# Patient Record
Sex: Male | Born: 1950
Health system: Southern US, Community
[De-identification: ages and names within clinical notes are randomized; demographics above are authoritative.]

## PROBLEM LIST (undated history)

## (undated) DIAGNOSIS — Z91038 Other insect allergy status: Secondary | ICD-10-CM

## (undated) DIAGNOSIS — J309 Allergic rhinitis, unspecified: Secondary | ICD-10-CM

## (undated) DIAGNOSIS — H43812 Vitreous degeneration, left eye: Secondary | ICD-10-CM

## (undated) DIAGNOSIS — K219 Gastro-esophageal reflux disease without esophagitis: Secondary | ICD-10-CM

## (undated) HISTORY — DX: Gastro-esophageal reflux disease without esophagitis: K21.9

## (undated) HISTORY — DX: Vitreous degeneration, left eye: H43.812

## (undated) HISTORY — DX: Allergic rhinitis, unspecified: J30.9

## (undated) HISTORY — DX: Other insect allergy status: Z91.038

---

## 1997-06-04 ENCOUNTER — Encounter: Payer: Self-pay | Admitting: Internal Medicine

## 1998-05-13 ENCOUNTER — Encounter: Payer: Self-pay | Admitting: Family Medicine

## 2004-04-08 ENCOUNTER — Ambulatory Visit: Payer: Self-pay | Admitting: Internal Medicine

## 2004-05-20 ENCOUNTER — Ambulatory Visit: Payer: Self-pay | Admitting: Internal Medicine

## 2005-03-08 ENCOUNTER — Encounter: Admission: RE | Admit: 2005-03-08 | Discharge: 2005-03-08 | Payer: Self-pay | Admitting: Orthopedic Surgery

## 2005-05-16 ENCOUNTER — Ambulatory Visit: Payer: Self-pay | Admitting: Internal Medicine

## 2005-11-14 ENCOUNTER — Ambulatory Visit: Payer: Self-pay | Admitting: Internal Medicine

## 2006-01-02 ENCOUNTER — Encounter: Payer: Self-pay | Admitting: Internal Medicine

## 2006-01-02 ENCOUNTER — Emergency Department (HOSPITAL_COMMUNITY): Admission: EM | Admit: 2006-01-02 | Discharge: 2006-01-02 | Payer: Self-pay | Admitting: Emergency Medicine

## 2006-02-02 ENCOUNTER — Encounter: Admission: RE | Admit: 2006-02-02 | Discharge: 2006-02-02 | Payer: Self-pay | Admitting: Orthopedic Surgery

## 2006-03-07 HISTORY — PX: OTHER SURGICAL HISTORY: SHX169

## 2006-03-27 ENCOUNTER — Ambulatory Visit: Payer: Self-pay | Admitting: Family Medicine

## 2006-05-04 ENCOUNTER — Ambulatory Visit: Payer: Self-pay | Admitting: Family Medicine

## 2006-05-04 LAB — CONVERTED CEMR LAB
ALT: 16 units/L (ref 0–40)
Albumin: 4 g/dL (ref 3.5–5.2)
Alkaline Phosphatase: 52 units/L (ref 39–117)
BUN: 12 mg/dL (ref 6–23)
Basophils Absolute: 0 10*3/uL (ref 0.0–0.1)
Basophils Relative: 0.4 % (ref 0.0–1.0)
CO2: 29 meq/L (ref 19–32)
Calcium: 8.7 mg/dL (ref 8.4–10.5)
Chloride: 101 meq/L (ref 96–112)
Creatinine, Ser: 0.7 mg/dL (ref 0.4–1.5)
HDL: 29.1 mg/dL — ABNORMAL LOW (ref >39.0)
Hemoglobin: 14.2 g/dL (ref 13.0–17.0)
LDL Cholesterol: 99 mg/dL (ref 0–99)
MCHC: 33.9 g/dL (ref 30.0–36.0)
Monocytes Relative: 9.6 % (ref 3.0–11.0)
Platelets: 257 10*3/uL (ref 150–400)
RBC: 5.42 M/uL (ref 4.22–5.81)
RDW: 12.3 % (ref 11.5–14.6)
TSH: 2.54 microintl units/mL (ref 0.35–5.50)
Total Bilirubin: 0.4 mg/dL (ref 0.3–1.2)
Total CHOL/HDL Ratio: 5
Total Protein: 6.8 g/dL (ref 6.0–8.3)
Triglycerides: 85 mg/dL (ref 0–149)
VLDL: 17 mg/dL (ref 0–40)

## 2006-05-06 HISTORY — PX: COLONOSCOPY: SHX174

## 2006-05-09 ENCOUNTER — Ambulatory Visit: Payer: Self-pay | Admitting: Internal Medicine

## 2006-05-17 ENCOUNTER — Encounter (INDEPENDENT_AMBULATORY_CARE_PROVIDER_SITE_OTHER): Payer: Self-pay | Admitting: Specialist

## 2006-05-17 ENCOUNTER — Ambulatory Visit: Payer: Self-pay | Admitting: Internal Medicine

## 2006-06-16 ENCOUNTER — Encounter: Payer: Self-pay | Admitting: Family Medicine

## 2006-07-05 ENCOUNTER — Encounter: Payer: Self-pay | Admitting: Internal Medicine

## 2006-11-14 ENCOUNTER — Ambulatory Visit: Payer: Self-pay | Admitting: Internal Medicine

## 2006-12-25 ENCOUNTER — Ambulatory Visit: Payer: Self-pay | Admitting: Internal Medicine

## 2008-02-14 ENCOUNTER — Ambulatory Visit: Payer: Self-pay | Admitting: Internal Medicine

## 2008-02-14 DIAGNOSIS — K219 Gastro-esophageal reflux disease without esophagitis: Secondary | ICD-10-CM | POA: Insufficient documentation

## 2008-02-14 DIAGNOSIS — J309 Allergic rhinitis, unspecified: Secondary | ICD-10-CM

## 2008-02-14 DIAGNOSIS — J302 Other seasonal allergic rhinitis: Secondary | ICD-10-CM | POA: Insufficient documentation

## 2008-02-14 DIAGNOSIS — J45901 Unspecified asthma with (acute) exacerbation: Secondary | ICD-10-CM | POA: Insufficient documentation

## 2008-02-14 HISTORY — DX: Allergic rhinitis, unspecified: J30.9

## 2008-08-23 ENCOUNTER — Emergency Department (HOSPITAL_COMMUNITY): Admission: EM | Admit: 2008-08-23 | Discharge: 2008-08-24 | Payer: Self-pay | Admitting: Emergency Medicine

## 2008-08-24 ENCOUNTER — Emergency Department (HOSPITAL_COMMUNITY): Admission: EM | Admit: 2008-08-24 | Discharge: 2008-08-24 | Payer: Self-pay | Admitting: Emergency Medicine

## 2009-02-12 ENCOUNTER — Ambulatory Visit: Payer: Self-pay | Admitting: Internal Medicine

## 2009-06-23 ENCOUNTER — Encounter: Payer: Self-pay | Admitting: Internal Medicine

## 2009-06-23 DIAGNOSIS — Z860100 Personal history of colon polyps, unspecified: Secondary | ICD-10-CM | POA: Insufficient documentation

## 2009-06-24 ENCOUNTER — Ambulatory Visit: Payer: Self-pay | Admitting: Internal Medicine

## 2009-11-03 ENCOUNTER — Ambulatory Visit: Payer: Self-pay | Admitting: Internal Medicine

## 2009-11-04 LAB — CONVERTED CEMR LAB
ALT: 20 units/L (ref 0–53)
AST: 16 units/L (ref 0–37)
Albumin: 4.2 g/dL (ref 3.5–5.2)
Alkaline Phosphatase: 47 units/L (ref 39–117)
Basophils Relative: 0.5 % (ref 0.0–3.0)
Bilirubin, Direct: 0.2 mg/dL (ref 0.0–0.3)
Calcium: 8.8 mg/dL (ref 8.4–10.5)
Chloride: 105 meq/L (ref 96–112)
Eosinophils Relative: 2.2 % (ref 0.0–5.0)
Glucose, Bld: 84 mg/dL (ref 70–99)
Hemoglobin: 14.9 g/dL (ref 13.0–17.0)
Lymphocytes Relative: 25.5 % (ref 12.0–46.0)
MCHC: 33.3 g/dL (ref 30.0–36.0)
MCV: 80.9 fL (ref 78.0–100.0)
Neutro Abs: 3.6 10*3/uL (ref 1.4–7.7)
Neutrophils Relative %: 62.5 % (ref 43.0–77.0)
Phosphorus: 2.6 mg/dL (ref 2.3–4.6)
Potassium: 4.1 meq/L (ref 3.5–5.1)
RBC: 5.54 M/uL (ref 4.22–5.81)
Sodium: 139 meq/L (ref 135–145)
TSH: 1.91 microintl units/mL (ref 0.35–5.50)
Total Protein: 6.7 g/dL (ref 6.0–8.3)
WBC: 5.7 10*3/uL (ref 4.5–10.5)

## 2010-02-11 ENCOUNTER — Ambulatory Visit: Payer: Self-pay | Admitting: Internal Medicine

## 2010-04-08 NOTE — Assessment & Plan Note (Signed)
Summary: rov 1 yr ///kp   Primary Provider/Referring Provider:  Hetty Ely  CC:  Yearly follow up visit-asthma and allergies; morning chest congestion "comes and goes".  History of Present Illness: 02/14/08-asthma, GERD Omeprazole works well with continuous use. Asthma stable, mainly exertional. Med talk. Had seasonal flu vax. Regular nasal saline irrigation with saline helps a lot. Last PFT 10/08- mild obstruction with some airtrapping.  February 12, 2009   Asthma, GERD Issue with med refills- addressed. Exercise is a trigger avoided since he isn't exercising much. He finds saline nasal wash to be a big help. He does some wood working, but without sensitivity noted to particular woods.Marland Kitchen He feels sense of smell is not acute. Beekeeping hobby- has had only local reactions but asks about having an epipen. Occasional urticaria on back after sitting in particular chair.   February 11, 2010- Asthma, GERD Nurse-CC: Yearly follow up visit-asthma and allergies; morning chest congestion "comes and goes" Incidental cold recently has been associated with morning congestion. He credits lack of throat clearing and nasal drainage with daily use of a saline nasal rinse.  Has been doing Advair once daily. We discussed going back up to twice daily if needed. Can feel a little chest tightness if stressed or exerting. He keeps bees and keeps an epipen just in case.     Asthma History    Initial Asthma Severity Rating:    Age range: 12+ years    Symptoms: 0-2 days/week    Nighttime Awakenings: 0-2/month    Interferes w/ normal activity: no limitations    SABA use (not for EIB): 0-2 days/week    Asthma Severity Assessment: Intermittent   Preventive Screening-Counseling & Management  Alcohol-Tobacco     Smoking Status: never  Current Medications (verified): 1)  Proair Hfa 108 (90 Base) Mcg/act Aers (Albuterol Sulfate) .... 2 Puffs Four Times A Day As Needed 2)  Advair Diskus 250-50 Mcg/dose Misc  (Fluticasone-Salmeterol) .... Inhale 1 Puff Two Times A Day Rinse Mouth Well After Use 3)  Omeprazole 20 Mg Cpdr (Omeprazole) .... Take 1 By Mouth Once Daily 4)  Epipen 2-Pak 0.3 Mg/0.60ml Devi (Epinephrine) .... For Severe Allergic Reaction  Allergies (verified): 1)  ! Sulfa  Past History:  Past Medical History: Last updated: 06/23/2009 Asthma Colonic polyps, hx of GERD  Past Surgical History: Last updated: 11/03/2009 Colonoscopy  3/08 Fractured C3 after fall---   ~2008  Family History: Last updated: 02/14/2008 Son has Hermansky Pudlak syndrome Mother -asthma/copd, passive smoking father- died copd  Social History: Last updated: 06/24/2009 Patient never smoked.  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    Vital Signs:  Patient profile:   60 year old male Height:      70.5 inches Weight:      224.50 pounds BMI:     31.87 O2 Sat:      99 % on Room air Pulse rate:   70 / minute BP sitting:   112 / 76  (left arm) Cuff size:   regular  Vitals Entered By: Reynaldo Minium CMA (February 11, 2010 9:06 AM)  O2 Flow:  Room air CC: Yearly follow up visit-asthma and allergies; morning chest congestion "comes and goes"   Physical Exam  Additional Exam:  General: A/Ox3; pleasant and cooperative, NAD, SKIN: no rash, lesions NODES: no lymphadenopathy HEENT: [place]/AT, EOM- WNL,  Conjuctivae- clear, PERRLA, TM-WNL, Nose- mild edema white mucus, Throat- clear and wnl, Mallampati III NECK: Supple w/ fair ROM, JVD- none, normal carotid impulses w/o bruits Thyroid- CHEST: Clear to P&A HEART: RRR, no m/g/r heard ABDOMEN: Soft and nl; nml bowel sounds;  ZOX:WRUE, nl pulses, no edema  NEURO: Grossly intact to observation      Impression & Recommendations:  Problem # 1:  ALLERGIC RHINITIS (ICD-477.9) He has not needed allergy treatment beyond antihistamines. This is a good season for him.  Good control  Problem # 2:  ASTHMA (ICD-493.90) Good control. We discussed Advair and compared rescue and maintenance meds.   Problem # 3:  GERD (ICD-530.81)  He is controlled well taking omeprazole. We reviewed basic reflux precautions and the impact of acid reflux on asthma.  His updated medication list for this problem includes:    Omeprazole 20 Mg Cpdr (Omeprazole) .Marland Kitchen... Take 1 by mouth once daily  Other Orders: Est. Patient Level IV (45409)  Patient Instructions: 1)  Please schedule a follow-up appointment in 1 year. 2)  Refill med scripts Prescriptions: EPIPEN 2-PAK 0.3 MG/0.3ML DEVI (EPINEPHRINE) For severe allergic reaction  #1 x prn   Entered and Authorized by:   Waymon Budge MD   Signed by:   Waymon Budge MD on 02/11/2010   Method used:   Print then Give to Patient   RxID:   [id number] ADVAIR DISKUS 250-50 MCG/DOSE MISC (FLUTICASONE-SALMETEROL) inhale 1 puff two times a day RINSE MOUTH WELL AFTER USE  #60 Each x prn   Entered and Authorized by:   Waymon Budge MD   Signed by:   Waymon Budge MD on 02/11/2010   Method used:   Print then Give to Patient   RxID:   [id number] PROAIR HFA 108 (90 BASE) MCG/ACT AERS (ALBUTEROL SULFATE) 2 puffs four times a day as needed  #1 x prn   Entered and Authorized by:   Waymon Budge MD   Signed by:   Waymon Budge MD on 02/11/2010   Method used:   Print then Give to Patient   RxID:    [id number]

## 2010-04-08 NOTE — Letter (Signed)
Summary: Dr.Kar[facility],Note  Dr.Karol Wolicki,[facility],Note   Imported By: Beau Fanny 06/24/2009 14:42:28  _____________________________________________________________________  External Attachment:    Type:   Image     Comment:   External Document

## 2010-04-08 NOTE — Assessment & Plan Note (Signed)
Summary: CPX/RBH   Vital Signs:  Patient profile:   60 year old male Height:      70.5 inches Weight:      218 pounds Temp:     98.5 degrees F oral Pulse rate:   60 / minute Pulse rhythm:   regular BP sitting:   112 / 60  (left arm) Cuff size:   large  Vitals Entered By: Mervin Hack CMA Duncan Dull) (November 03, 2009 8:42 AM) CC: adult physical   History of Present Illness: Doing well Has had a lot of stress lately---haveing some trouble with sleep Initiates well but then awakens in night and can't get back to sleep things "running through my head" Some daytime fatigue but no excessive somnolence Wife now in counselling due to stress son with chronic disease--will not be able to work Parents now with increasing care needs Prefers no meds Not much exercise though he belongs to the [employer] Has wood shop but hasn't been there much Works at home at night---gets his mind going counselled on sleep hygiene   Allergies: 1)  ! Sulfa  Past History:  Past medical, surgical, family and social histories (including risk factors) reviewed for relevance to current acute and chronic problems.  Past Medical History: Reviewed history from 06/23/2009 and no changes required. Asthma Colonic polyps, hx of GERD  Past Surgical History: Colonoscopy  3/08 Fractured C3 after fall---   ~2008  Family History: Reviewed history from 02/14/2008 and no changes required. Son has Hermansky Pudlak syndrome Mother -asthma/copd, passive smoking father- died copd  Social History: Reviewed history from 06/24/2009 and no changes required. Patient never smoked.  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 Eyes:  Denies double vision and vision loss-1 eye. ENT:  Complains of decreased hearing and ringing in ears; stable  tinnitus on left some decreased hearing on left with conflicting  teeth okay--regular with dentist. CV:  Complains of difficulty breathing at night and shortness of breath with exertion; denies chest pain or discomfort, difficulty breathing while lying down, fainting, lightheadness, and palpitations; rare night asthma symptoms --occ brought on by exertion. Resp:  Complains of cough and shortness of breath; asthma generally controlled but usually does okay Dr Maple Hudson manages Rarely needs rescue inhaler. GI:  Denies abdominal pain, bloody stools, change in bowel habits, dark tarry stools, indigestion, nausea, and vomiting; omeprazole controls his heartburn. Uses to help keep asthma controlled. GU:  Denies erectile dysfunction, urinary frequency, and urinary hesitancy; asthma has affected sex life--working with Dr Maple Hudson on this. MS:  Denies joint pain and joint swelling. Derm:  Complains of rash; denies lesion(s); occ rashes that come and go--benedryl helps. Neuro:  Denies headaches, numbness, tingling, and weakness. Psych:  Complains of anxiety; denies depression. Heme:  Denies abnormal bruising and enlarge lymph nodes. Allergy:  Complains of seasonal allergies and sneezing; seasonal allergies have actually improved. Uses saline washes and this helps.  Physical Exam  General:  alert and normal appearance.   Eyes:  pupils equal, pupils round, pupils reactive to light, and no optic disk abnormalities.   Ears:  R ear normal and L ear normal.   Mouth:  no erythema, no exudates, and no lesions.   Neck:  supple, no masses, no thyromegaly, no carotid bruits, and no cervical lymphadenopathy.   Lungs:  normal respiratory effort, no intercostal retractions, no  accessory muscle use, and normal breath sounds.   Heart:  normal rate, regular rhythm, no murmur, and no gallop.   Abdomen:  soft, non-tender, and no masses.   Rectal:  no hemorrhoids and no masses.   Prostate:  no gland enlargement and no  nodules.   Msk:  no joint tenderness and no joint swelling.   Pulses:  1+ in feet Extremities:  no edema Neurologic:  alert & oriented X3, strength normal in all extremities, and gait normal.   Skin:  no rashes and no suspicious lesions.   Axillary Nodes:  No palpable lymphadenopathy Psych:  normally interactive, good eye contact, not anxious appearing, and not depressed appearing.     Impression & Recommendations:  Problem # 1:  PREVENTIVE HEALTH CARE (ICD-V70.0) Assessment Comment Only healthy due for PSA discussed fitness  Problem # 2:  ASTHMA (ICD-493.90) Assessment: Comment Only stable Dr Maple Hudson manages  His updated medication list for this problem includes:    Proair Hfa 108 (90 Base) Mcg/act Aers (Albuterol sulfate) .Marland Kitchen... 2 puffs four times a day as needed    Advair Diskus 250-50 Mcg/dose Misc (Fluticasone-salmeterol) ..... Inhale 1 puff two times a day rinse mouth well after use  Problem # 3:  GERD (ICD-530.81) Assessment: Comment Only  stable status needs to continue for concern that this affects his asthma  His updated medication list for this problem includes:    Omeprazole 20 Mg Cpdr (Omeprazole) .Marland Kitchen... Take 1 by mouth once daily  Orders: TLB-Renal Function Panel (80069-RENAL) TLB-CBC Platelet - w/Differential (85025-CBCD) TLB-Hepatic/Liver Function Pnl (80076-HEPATIC) TLB-TSH (Thyroid Stimulating Hormone) (84443-TSH) Venipuncture (60454)  Complete Medication List: 1)  Proair Hfa 108 (90 Base) Mcg/act Aers (Albuterol sulfate) .... 2 puffs four times a day as needed 2)  Advair Diskus 250-50 Mcg/dose Misc (Fluticasone-salmeterol) .... Inhale 1 puff two times a day rinse mouth well after use 3)  Omeprazole 20 Mg Cpdr (Omeprazole) .... Take 1 by mouth once daily 4)  Epipen 2-pak 0.3 Mg/0.25ml Devi (Epinephrine) .... For severe allergic reaction  Other Orders: TLB-PSA (Prostate Specific Antigen) (84153-PSA)  Patient Instructions: 1)  Please schedule a  follow-up appointment in 1 year.   Current Allergies (reviewed today): ! SULFA   Bone Density  Procedure date:  05/13/1998  Findings:       Lumbar Spine:  T Score > -1.0 Spine.  Hip Total: T Score > -1.0 Hip.

## 2010-04-08 NOTE — Assessment & Plan Note (Signed)
Summary: TICK BITE - PER DEE...CDAVIS   Vital Signs:  Patient profile:   60 year old male Weight:      221 pounds Temp:     98.3 degrees F oral Pulse rate:   80 / minute Pulse rhythm:   regular BP sitting:   118 / 70  (left arm) Cuff size:   large  Vitals Entered By: Mervin Hack CMA Duncan Dull) (June 24, 2009 9:07 AM) CC: Tick bite   History of Present Illness: Continues with Dr Maple Hudson for his allergies and asthma maintained on meds No PE in some time  Tick bite on right arm and pulled it off 4-5 days ago Could have been attached for several days firmly attached Noted hardness the next day and some weeping Induration has improved Tried neosporin  No fever no generalized rash  Allergies: 1)  ! Sulfa  Past History:  Past medical, surgical, family and social histories (including risk factors) reviewed for relevance to current acute and chronic problems.  Past Medical History: Reviewed history from 06/23/2009 and no changes required. Asthma Colonic polyps, hx of GERD  Past Surgical History: Reviewed history from 06/23/2009 and no changes required. Colonoscopy  3/08  Family History: Reviewed history from 02/14/2008 and no changes required. Son has Hermansky Pudlak syndrome Mother -asthma/copd, passive smoking father- died copd  Social History: Reviewed history from 06/23/2009 and no changes required. Patient never smoked.  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  NAD Skin:  eczematous type raised rash with some induration at center along extensor right arm just above elbow slight warmth   Impression & Recommendations:  Problem # 1:  CELLULITIS/ABSCESS, ARM (ICD-682.3) Assessment New  not clear infection but extended reaction no ECM or generalized symptoms  will  treat with doxy just in case try warm compresses  His updated medication list for this problem includes:    Doxycycline Hyclate 100 Mg Caps (Doxycycline hyclate) .Marland Kitchen... 1 by mouth two times a day for skin infection  Complete Medication List: 1)  Proair Hfa 108 (90 Base) Mcg/act Aers (Albuterol sulfate) .... 2 puffs four times a day as needed 2)  Advair Diskus 250-50 Mcg/dose Misc (Fluticasone-salmeterol) .... Inhale 1 puff two times a day rinse mouth well after use 3)  Omeprazole 20 Mg Cpdr (Omeprazole) .... Take 1 by mouth once daily 4)  Epipen 2-pak 0.3 Mg/0.54ml Devi (Epinephrine) .... For severe allergic reaction 5)  Nasonex 50 Mcg/act Susp (Mometasone furoate) .... Two sprays per day 6)  Loratadine 10 Mg Tabs (Loratadine) .... Take 1 tablet by mouth once a day 7)  Doxycycline Hyclate 100 Mg Caps (Doxycycline hyclate) .Marland Kitchen.. 1 by mouth two times a day for skin infection  Patient Instructions: 1)  Please schedule a physical at your convenience Prescriptions: DOXYCYCLINE HYCLATE 100 MG CAPS (DOXYCYCLINE HYCLATE) 1 by mouth two times a day for skin infection  #20 x 0   Entered and Authorized by:   Cindee Salt MD   Signed by:   Cindee Salt MD on 06/24/2009   Method used:   Electron[facility] to        [id number] (retail[facility]       [address]       [place], [place]  [address]       Ph:  [phone] or [phone]       Fax: [phone]   RxID:   [id number]   Current Allergies (reviewed today): ! SULFA

## 2010-07-20 NOTE — Assessment & Plan Note (Signed)
[facility]                             PULMONARY OFFICE NOTE   NAME:Terrence Middleton, Terrence Middleton                    MRN:          [id number]  DATE:11/14/2006                            DOB:          12/23/1950    PROBLEM LIST:  1. Allergic rhinitis.  2. Asthma.  3. Esophageal reflux.   HISTORY:  He has stayed with Advair 250/50.  He finds he needs daily  Prilosec and cannot afford to stop it or his reflux flares.  Throat  clearing persists.  He saw Dr. Pollyann Kennedy for ENT evaluation and they  discussed deviated septum and saline lavage.  He does not want to do  surgery now.  Nasonex seems to help.   MEDICATIONS:  1. Advair 250/50 b.i.d.  2. Nasonex.  3. Prilosec.  4. Albuterol rescue inhaler.   ALLERGIES:  SULFA.   OBJECTIVE:  VITAL SIGNS:  Weight 221 pounds, BP 122/82, pulse 70, room  air saturation 99%.  CHEST:  Clear.  HEART:  Regular without murmur.  HEENT:  No visible nasal drainage.   IMPRESSION:  Fairly stable currently.  He needs continued attention to  reflux.   PLAN:  Environmental precautions were emphasized.  Scheduled pulmonary  function test.  Scheduled to return in 1 year.     Clinton D. Maple Hudson, MD, [facility], FACP  Electronically Signed    CDY/MedQ  DD: 11/19/2006  DT: 11/20/2006  Job #: [id number]   cc:   Arta Silence, MD

## 2010-07-23 NOTE — Assessment & Plan Note (Signed)
 [facility]                               PULMONARY OFFICE NOTE   NAME:Dredge, JAYLON BOYLEN                    MRN:          [id number]  DATE:11/14/2005                            DOB:          Aug 26, 1950    PROBLEM LIST:  1. Allergic rhinitis.  2. Asthma.  3. Possible esophageal reflux.   HISTORY:  He says he is doing very well.  He stayed with Advair 250/50,  rather than trying higher dose.  He has lost weight with diet and finds his  exercise tolerance and general breathing comfort have improved.  Emphasizing  anti-reflux therapy also was of big symptomatic help.  He did not realize  how much of his chest congestion apparently was due to this problem.   MEDICATIONS:  Advair 250/50 b.i.d., Nasonex, Prozac, Prilosec, rescue  albuterol.   OBJECTIVE:  VITAL SIGNS:  Weight 213 pounds, BP 112/70, pulse regular 75,  room air saturation 98%.  HEENT:  Throat may be minimally red over the tonsillar pillars with no  increased glandularity or visible drainage.  His voice quality is normal.  Nasal airway is clear.  No adenopathy.  LUNGS:  Clear.  HEART:  Sounds regular without murmur.   IMPRESSION:  1. Mild asthma and rhinitis are adequately controlled.  2. Reflux is much better controlled.   PLAN:  1. We refill his inhalers.  2. Schedule return in 1 year, anticipating repeat PFT at that time.                                   Clinton D. Maple Hudson, MD, [facility], FACP   CDY/MedQ  DD:  11/14/2005  DT:  11/15/2005  Job #:  [id number]   cc:   Arta Silence, MD

## 2011-02-09 ENCOUNTER — Encounter: Payer: Self-pay | Admitting: Internal Medicine

## 2011-02-10 ENCOUNTER — Encounter: Payer: Self-pay | Admitting: Internal Medicine

## 2011-02-10 ENCOUNTER — Ambulatory Visit (INDEPENDENT_AMBULATORY_CARE_PROVIDER_SITE_OTHER): Payer: 59 | Admitting: Internal Medicine

## 2011-02-10 VITALS — BP 118/82 | HR 72 | Ht 70.5 in | Wt 225.0 lb

## 2011-02-10 DIAGNOSIS — Z91038 Other insect allergy status: Secondary | ICD-10-CM

## 2011-02-10 DIAGNOSIS — J45909 Unspecified asthma, uncomplicated: Secondary | ICD-10-CM

## 2011-02-10 DIAGNOSIS — T7840XA Allergy, unspecified, initial encounter: Secondary | ICD-10-CM

## 2011-02-10 DIAGNOSIS — K219 Gastro-esophageal reflux disease without esophagitis: Secondary | ICD-10-CM

## 2011-02-10 HISTORY — DX: Other insect allergy status: Z91.038

## 2011-02-10 MED ORDER — EPINEPHRINE 0.3 MG/0.3ML IJ DEVI: 0.3000 mg | Freq: Once | INTRAMUSCULAR | Status: DC | PRN

## 2011-02-10 MED ORDER — ALBUTEROL SULFATE HFA 108 (90 BASE) MCG/ACT IN AERS: 2.0000 | INHALATION_SPRAY | Freq: Four times a day (QID) | RESPIRATORY_TRACT | Status: DC | PRN

## 2011-02-10 MED ORDER — FLUTICASONE-SALMETEROL 250-50 MCG/DOSE IN AEPB: 1.0000 | INHALATION_SPRAY | Freq: Two times a day (BID) | RESPIRATORY_TRACT | Status: DC

## 2011-02-10 NOTE — Patient Instructions (Signed)
Medication refills  Please get your flu shot at work ASAP  Please call as needed

## 2011-02-10 NOTE — Progress Notes (Signed)
02/10/11- 60 yoM never smoker followed for asthma, allergic rhinitis, complicated by GERD, history of insect venom allergy LOV-02/11/2010 Nurse is refilling meds as reviewed. He gets flu vaccine at work. Has had a very good year from her breathing and allergy standpoint. Just in the last 3 weeks, had sore throat and head cold with mild bronchitis and some transient wheezing. He uses Advair regularly and only very occasionally needs a rescue inhaler mainly before exercise. He does find he needs to continue daily omeprazole. 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 He has reacted locally to honeybee and to yellow jacket.  ROS-see HPI Constitutional:   No-   weight loss, night sweats, fevers, chills, fatigue, lassitude. HEENT:   No-  headaches, difficulty swallowing, tooth/dental problems, sore throat,       No-  sneezing, itching, ear ache, nasal congestion, post nasal drip,  CV:  No-   chest pain, orthopnea, PND, swelling in lower extremities, anasarca,                                  dizziness, palpitations Resp: No-   shortness of breath with exertion or at rest.              No-   productive cough,  No non-productive cough,  No- coughing up of blood.              No-   change in color of mucus.  + wheezing.   Skin: No-   rash or lesions. GI:  No-   heartburn, indigestion, abdominal pain, nausea, vomiting, diarrhea,                 change in bowel habits, loss of appetite GU: MS:  . Neuro-     nothing unusual Psych:  No- change in mood or affect. No depression or anxiety.  No memory loss.  OBJ General- Alert, Oriented, Affect-appropriate, Distress- none acute Skin- rash-none, lesions- none, excoriation- none Lymphadenopathy- none Head- atraumatic            Eyes- Gross vision intact, PERRLA, conjunctivae clear secretions            Ears- Hearing, canals-normal            Nose- Clear, no-Septal dev, mucus, polyps, erosion, perforation            Throat- Mallampati II , mucosa clear , drainage- none, tonsils- atrophic Neck- flexible , trachea midline, no stridor , thyroid nl, carotid no bruit Chest - symmetrical excursion , unlabored           Heart/CV- RRR , no murmur , no gallop  , no rub, nl s1 s2                           - JVD- none , edema- none, stasis changes- none, varices- none           Lung- clear to P&A, wheeze- none, cough- none , dullness-none, rub- none           Chest wall-  Abd-  Br/ Gen/ Rectal- Not done, not indicated Extrem- cyanosis- none, clubbing, none, atrophy- none, strength- nl Neuro- grossly intact to observation

## 2011-02-13 NOTE — Assessment & Plan Note (Signed)
Controlled with maintenance of omeprazole.

## 2011-02-13 NOTE — Assessment & Plan Note (Signed)
I explained the distinction between local and systemic reactions to stings. He will keep an EpiPen and pretreat with antihistamines.

## 2011-02-13 NOTE — Assessment & Plan Note (Signed)
Mild intermittent asthma generally well contained. He does need his maintenance Advair. We reviewed this. I asked him to make sure he got his flu vaccine as soon as possible at work.

## 2011-03-03 ENCOUNTER — Telehealth: Payer: Self-pay | Admitting: Internal Medicine

## 2011-03-03 ENCOUNTER — Other Ambulatory Visit (INDEPENDENT_AMBULATORY_CARE_PROVIDER_SITE_OTHER): Payer: 59

## 2011-03-03 ENCOUNTER — Ambulatory Visit (INDEPENDENT_AMBULATORY_CARE_PROVIDER_SITE_OTHER)
Admission: RE | Admit: 2011-03-03 | Discharge: 2011-03-03 | Disposition: A | Discharge status: Home / Self Care | Payer: 59 | Source: Ambulatory Visit | Attending: Internal Medicine | Admitting: Internal Medicine

## 2011-03-03 DIAGNOSIS — J40 Bronchitis, not specified as acute or chronic: Secondary | ICD-10-CM

## 2011-03-03 LAB — CBC WITH DIFFERENTIAL/PLATELET
Basophils Absolute: 0 10*3/uL (ref 0.0–0.1)
Basophils Relative: 0.7 % (ref 0.0–3.0)
Eosinophils Absolute: 0.2 10*3/uL (ref 0.0–0.7)
Lymphocytes Relative: 31.1 % (ref 12.0–46.0)
MCHC: 33.5 g/dL (ref 30.0–36.0)
MCV: 78.8 fl (ref 78.0–100.0)
Monocytes Absolute: 0.4 10*3/uL (ref 0.1–1.0)
Neutrophils Relative %: 58.1 % (ref 43.0–77.0)
Platelets: 236 10*3/uL (ref 150.0–400.0)
RDW: 13.6 % (ref 11.5–14.6)

## 2011-03-03 MED ORDER — DOXYCYCLINE HYCLATE 100 MG PO TABS: ORAL_TABLET | ORAL | Status: DC

## 2011-03-03 NOTE — Telephone Encounter (Signed)
Per CY-okay to give Doxycycline 100 mg #8 take 2 today then 1 daily no refills and Outpt CXR DX Bronchitis and CBC with diff.

## 2011-03-03 NOTE — Telephone Encounter (Signed)
Called and spoke with pt and he stated that he has been fighting this URI for about 2 weeks.  Rattling in his chest, fever at night for 2-3 days last week. Body aches and headaches.  Pt is concerned since he has asthma---just not able to shake this.  If he takes a deep breath then this causes him to cough.  He has been around a child that was dx  With PNA and has passed away from this.  CY please advise.  Thanks  Allergies  Allergen Reactions  . Sulfonamide Derivatives

## 2011-03-03 NOTE — Telephone Encounter (Signed)
Called and spoke with pt and he is aware of CY recs for the doxy that has been sent to his pharmacy.  Pt will come by today for cxr and labs that have already been placed in the computer.

## 2011-03-07 NOTE — Progress Notes (Signed)
Quick Note:  Pt aware of results and verbally understood ______

## 2011-03-07 NOTE — Progress Notes (Signed)
Quick Note:  Pt aware of test results ______

## 2011-05-04 ENCOUNTER — Encounter: Payer: Self-pay | Admitting: Internal Medicine

## 2011-05-12 ENCOUNTER — Other Ambulatory Visit: Payer: Self-pay | Admitting: Internal Medicine

## 2011-06-22 ENCOUNTER — Telehealth: Payer: Self-pay | Admitting: Internal Medicine

## 2011-06-22 MED ORDER — AZITHROMYCIN 250 MG PO TABS: ORAL_TABLET | ORAL | Status: AC

## 2011-06-22 NOTE — Telephone Encounter (Signed)
Called spoke with patient who c/o low grade temp, deep cough with clear mucus, heaviness in chest, wheezing, increased SOB, head congestion with bilat ear discomfort x6 days.  Had to use rescue inhaler this AM.  Has been taking a cough suppressant, degongestant and antihistamine.  Requesting to be seen.  Last ov with CDY 12.6.12, follow up in 1 year > 12.12.13.  [facility]. Allergies  Allergen Reactions  . Sulfonamide Derivatives    Dr Maple Hudson please advise, thanks.

## 2011-06-22 NOTE — Telephone Encounter (Signed)
Per CY-okay to give Zpak #1 take as directed no refills and work in to see TP if no openings with CY.

## 2011-06-22 NOTE — Telephone Encounter (Signed)
Spoke with pt and he is ok with rx being called in. He states if he is not better at end of course he will call for OV at that time.Carron Curie, CMA

## 2011-06-22 NOTE — Telephone Encounter (Signed)
LMOMTCB x 1 

## 2011-06-22 NOTE — Telephone Encounter (Signed)
Pt returned call. Kathleen W Perdue  

## 2011-07-19 ENCOUNTER — Telehealth: Payer: Self-pay | Admitting: Internal Medicine

## 2011-07-19 MED ORDER — CLARITHROMYCIN 500 MG PO TABS: 500.0000 mg | ORAL_TABLET | Freq: Two times a day (BID) | ORAL | Status: AC

## 2011-07-19 MED ORDER — PREDNISONE 10 MG PO TABS: ORAL_TABLET | ORAL | Status: DC

## 2011-07-19 NOTE — Telephone Encounter (Signed)
Called spoke with patient, advised of CDY's recs.  Pt verbalized his understanding and will call if symptoms do not improve or worsen.  rx's sent to verified pharmacy.

## 2011-07-19 NOTE — Telephone Encounter (Signed)
Called, spoke with pt.  He was given z pak on 06/22/11 - reports he did get better x 4-5 days.  C/o having prod cough with light yellowish brown mucus, head and chest congestion, PND, wheezing, and increased SOB at times.  Taking claritin, allergra, and decongestant when needed.  Had not needed to use rescue HFA this week.  No openings with CDY until 07/26/11.  Requesting recs -- ? OV needed ?  Dr. Maple Hudson, pls advise.  Thank you.    [facility]  Allergies verified:  Allergies  Allergen Reactions  . Sulfonamide Derivatives     Last OV 02/10/11 > f/u in 1 yr.

## 2011-07-19 NOTE — Telephone Encounter (Signed)
Per CY-okay to give Biaxin 500 mg #20 take 1 po bid after meal no refills, and offer Prednisone 10 mg #20 take 4 x 2 days, 3 x 2 days, 2 x 2 days, 1 x 2 days, then stop no refills.

## 2011-08-02 ENCOUNTER — Ambulatory Visit (INDEPENDENT_AMBULATORY_CARE_PROVIDER_SITE_OTHER): Payer: 59 | Admitting: Internal Medicine

## 2011-08-02 ENCOUNTER — Encounter: Payer: Self-pay | Admitting: Internal Medicine

## 2011-08-02 VITALS — BP 116/62 | HR 80 | Temp 97.3°F | Ht 70.5 in | Wt 224.0 lb

## 2011-08-02 DIAGNOSIS — J45909 Unspecified asthma, uncomplicated: Secondary | ICD-10-CM

## 2011-08-02 DIAGNOSIS — B370 Candidal stomatitis: Secondary | ICD-10-CM

## 2011-08-02 DIAGNOSIS — J45901 Unspecified asthma with (acute) exacerbation: Secondary | ICD-10-CM

## 2011-08-02 DIAGNOSIS — B3781 Candidal esophagitis: Secondary | ICD-10-CM

## 2011-08-02 MED ORDER — LEVALBUTEROL HCL 0.63 MG/3ML IN NEBU
0.6300 mg | INHALATION_SOLUTION | Freq: Once | RESPIRATORY_TRACT | Status: AC
  Administered 2011-08-02: 0.63 mg via RESPIRATORY_TRACT

## 2011-08-02 MED ORDER — AMOXICILLIN-POT CLAVULANATE 875-125 MG PO TABS: 1.0000 | ORAL_TABLET | Freq: Two times a day (BID) | ORAL | Status: AC

## 2011-08-02 MED ORDER — PROMETHAZINE-CODEINE 6.25-10 MG/5ML PO SYRP: 5.0000 mL | ORAL_SOLUTION | Freq: Four times a day (QID) | ORAL | Status: AC | PRN

## 2011-08-02 NOTE — Progress Notes (Signed)
02/10/11- 60 yoM never smoker followed for asthma, allergic rhinitis, complicated by GERD, history of insect venom allergy LOV-02/11/2010 Nurse is refilling meds as reviewed. He gets flu vaccine at work. Has had a very good year from her breathing and allergy standpoint. Just in the last 3 weeks, had sore throat and head cold with mild bronchitis and some transient wheezing. He uses Advair regularly and only very occasionally needs a rescue inhaler mainly before exercise. He does find he needs to continue daily omeprazole. 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 He has reacted locally to honeybee and to yellow jacket.  08/02/11- 60 yoM never smoker followed for asthma, allergic rhinitis, complicated by GERD, history of insect venom allergy ACUTE VISIT: about 6 wks ago took Zpak, then came back and was given Biaxin and Prednisone. Has completed few days ago-developed growth in mouth(went to see dentist-Thrush)took Nystatin(currently on as well). Gotten worse since abx and pred-coughing-deep in chest, dizziness as well., pressure in ears, heaviness in chest. Feels that he is relapsing. No energy. Deep cough with rattling in chest. Brown mucous plugs. No fever, blood or pain.  ROS-see HPI Constitutional:   No-   weight loss, night sweats, fevers, chills, fatigue, lassitude. HEENT:   No-  headaches, difficulty swallowing, tooth/dental problems, sore throat,       No-  sneezing, itching, ear ache, nasal congestion, post nasal drip,  CV:  No-   chest pain, orthopnea, PND, swelling in lower extremities, anasarca, dizziness, palpitations Resp: No-   shortness of breath with exertion or at rest.              No-   productive cough,  No non-productive cough,  No- coughing up of blood.              No-   change in color of mucus.  + wheezing.   Skin: No-   rash or lesions. GI:  No-   heartburn, indigestion, abdominal pain, nausea, vomiting,  GU: MS:   . Neuro-     nothing unusual Psych:  No- change in mood or affect. No depression or anxiety.  No memory loss.  OBJ General- Alert, Oriented, Affect-appropriate, Distress- none acute Skin- rash-none, lesions- none, excoriation- none Lymphadenopathy- none Head- atraumatic            Eyes- Gross vision intact, PERRLA, conjunctivae clear secretions            Ears- Hearing, canals-normal            Nose- Clear, no-Septal dev, mucus, polyps, erosion, perforation             Throat- Mallampati II , mucosa clear , drainage- none, tonsils- atrophic. Mild thrush Neck- flexible , trachea midline, no stridor , thyroid nl, carotid no bruit Chest - symmetrical excursion , unlabored           Heart/CV- RRR , no murmur , no gallop  , no rub, nl s1 s2                           - JVD- none , edema- none, stasis changes- none, varices- none           Lung- wheezy cough with deep breath , dullness-none, rub- none           Chest wall-  Abd-  Br/ Gen/ Rectal- Not done, not indicated Extrem- cyanosis-  none, clubbing, none, atrophy- none, strength- nl Neuro- grossly intact to observation

## 2011-08-02 NOTE — Patient Instructions (Signed)
Neb Xop 0.63  Order CXR  For tomorrow   Dx asthma with bronchitis  Script for augmentin antibiotic     It may help with this to take an otc probiotic like Align, Florastor, or Activia yogurt  Script for cough syrup  Continue the Nystatin mouth rinse  for now. We may need to add back a steroid later this wek if you don't seem to be improving

## 2011-08-03 ENCOUNTER — Ambulatory Visit (INDEPENDENT_AMBULATORY_CARE_PROVIDER_SITE_OTHER)
Admission: RE | Admit: 2011-08-03 | Discharge: 2011-08-03 | Disposition: A | Discharge status: Home / Self Care | Payer: 59 | Source: Ambulatory Visit | Attending: Internal Medicine | Admitting: Internal Medicine

## 2011-08-03 DIAGNOSIS — J45909 Unspecified asthma, uncomplicated: Secondary | ICD-10-CM

## 2011-08-05 ENCOUNTER — Telehealth: Payer: Self-pay | Admitting: Internal Medicine

## 2011-08-05 MED ORDER — PREDNISONE 10 MG PO TABS: ORAL_TABLET | ORAL | Status: DC

## 2011-08-05 NOTE — Telephone Encounter (Signed)
Patient returning call.

## 2011-08-05 NOTE — Telephone Encounter (Signed)
Per CY-okay to give patient Prednisone 10 mg #20 take 4 x 2 days, 3 x 2 days, 2 x 2 days, 1 x 2 days, then stop no refills.  

## 2011-08-05 NOTE — Telephone Encounter (Signed)
lmomtcb x1 for pt 

## 2011-08-05 NOTE — Telephone Encounter (Signed)
rx has been sent and pt is aware 

## 2011-08-05 NOTE — Telephone Encounter (Signed)
Notes Recorded by Waymon Budge, MD on 08/03/2011 at 5:41 PM CXR- just looks like bronchitis changes- no pneumonia I spoke with patient about results and he verbalized understanding and had no questions  Also pt states he is some betetr but not completely. He is still having a lot of chest congestion but the congestion in his head is some better. He is calling bc he believes he does not prednisone called in for him. Pt states it okay to leave detalied message on VM when rx is sent in since he is working. Please advise Dr. Maple Hudson thanks  Allergies  Allergen Reactions  . Sulfonamide Derivatives      [facility]

## 2011-08-06 DIAGNOSIS — B370 Candidal stomatitis: Secondary | ICD-10-CM | POA: Insufficient documentation

## 2011-08-06 DIAGNOSIS — B3781 Candidal esophagitis: Secondary | ICD-10-CM | POA: Insufficient documentation

## 2011-08-06 NOTE — Assessment & Plan Note (Signed)
Plan-continue nystatin. We will avoid adding systemic steroids now.

## 2011-08-06 NOTE — Assessment & Plan Note (Signed)
Sustained exacerbation. Plan cough syrup, Augmentin, Mucinex DM. Chest x-ray

## 2011-08-09 NOTE — Progress Notes (Signed)
Quick Note:  Pt aware of results. ______ 

## 2011-08-12 ENCOUNTER — Telehealth: Payer: Self-pay | Admitting: Internal Medicine

## 2011-08-12 ENCOUNTER — Other Ambulatory Visit: Payer: 59

## 2011-08-12 DIAGNOSIS — J449 Chronic obstructive pulmonary disease, unspecified: Secondary | ICD-10-CM

## 2011-08-12 NOTE — Telephone Encounter (Signed)
Spoke with pt. He states that he is calling to report to CDY that he continues to have prod cough with green to white sputum. He states just finished second prednisone taper. He states does not know where to go from here since has already been through 3 rounds of abx and 2 rounds of prednisone. Please advise, thanks!

## 2011-08-12 NOTE — Telephone Encounter (Signed)
Pt aware and will come by today for labs and culture. Will pick up samples at front desk as well. Orders placed.

## 2011-08-12 NOTE — Telephone Encounter (Signed)
Order- Allergy profile    Dx Chronic asthma with bronchitis             Sputum culture- routine and AFB      2 samples of Advair 500   Use this for 2 weeks-  1 puff then rinse well, twice daily- then return to Advair 250

## 2011-08-15 LAB — ALLERGY FULL PROFILE
Allergen, D pternoyssinus,d7: 0.19 kU/L (ref <0.35)
Aspergillus fumigatus, IgG: <0.1 kU/L (ref <0.35)
Bahia Grass: 0.76 kU/L — ABNORMAL HIGH (ref <0.35)
Bermuda Grass: 0.14 kU/L (ref <0.35)
Candida Albicans: <0.1 kU/L (ref <0.35)
D. farinae: 0.14 kU/L (ref <0.35)
Elm IgE: 0.44 kU/L — ABNORMAL HIGH (ref <0.35)
G009 Red Top: 0.43 kU/L — ABNORMAL HIGH (ref <0.35)
Oak: 0.15 kU/L (ref <0.35)
Plantain: <0.1 kU/L (ref <0.35)
Sycamore Tree: 0.15 kU/L (ref <0.35)
Timothy Grass: 0.28 kU/L (ref <0.35)

## 2011-08-15 LAB — RESPIRATORY CULTURE OR RESPIRATORY AND SPUTUM CULTURE: Organism ID, Bacteria: NORMAL

## 2011-09-28 LAB — AFB CULTURE WITH SMEAR (NOT AT ARMC): Acid Fast Smear: NONE SEEN

## 2011-09-29 NOTE — Progress Notes (Signed)
Quick Note:  Called patients home/cell # as provided, no answer. lmtcb ______

## 2011-10-04 NOTE — Progress Notes (Signed)
Quick Note:  Pt aware of results. ______ 

## 2011-10-26 ENCOUNTER — Encounter: Payer: Self-pay | Admitting: Family Medicine

## 2011-10-26 ENCOUNTER — Ambulatory Visit (INDEPENDENT_AMBULATORY_CARE_PROVIDER_SITE_OTHER): Payer: 59 | Admitting: Family Medicine

## 2011-10-26 ENCOUNTER — Telehealth: Payer: Self-pay | Admitting: Internal Medicine

## 2011-10-26 VITALS — BP 120/64 | HR 72 | Temp 98.0°F | Wt 222.0 lb

## 2011-10-26 DIAGNOSIS — R198 Other specified symptoms and signs involving the digestive system and abdomen: Secondary | ICD-10-CM

## 2011-10-26 DIAGNOSIS — R109 Unspecified abdominal pain: Secondary | ICD-10-CM

## 2011-10-26 DIAGNOSIS — M722 Plantar fascial fibromatosis: Secondary | ICD-10-CM

## 2011-10-26 DIAGNOSIS — R194 Change in bowel habit: Secondary | ICD-10-CM

## 2011-10-26 LAB — POC HEMOCCULT BLD/STL (OFFICE/1-CARD/DIAGNOSTIC): Fecal Occult Blood, POC: NEGATIVE

## 2011-10-26 LAB — POCT URINALYSIS DIPSTICK
Blood, UA: NEGATIVE
Leukocytes, UA: NEGATIVE
Nitrite, UA: NEGATIVE
Protein, UA: NEGATIVE
pH, UA: 6.5

## 2011-10-26 NOTE — Assessment & Plan Note (Addendum)
Thin stools (decreased diameter) as well as abd fullness and lower left back pain.  Hemoccult negative. Prostate exam overall normal, UA normal.  Some tightness noted on DRE but no evident mass appreciated.  Mild weight loss noted. No acute abdomen on exam. Given associated with BM changes, h/o polyps, will refer back to GI for consideration of rpt colonoscopy Anticipate some component of constipation - recommended continue stool softener, start miralax capful daily.

## 2011-10-26 NOTE — Progress Notes (Signed)
  Subjective:    Patient ID: Terrence Middleton, male    DOB: 07-07-1950, 61 y.o.   MRN: [id number]  HPI CC: abd fullness/pain?  On omeprazole 20mg  daily for GERD  A few months ago noticed having more abd pain described as pain in lower right back, describes abd fullness and trouble fully defecating.  Notices takes longer than normal to have stool, diameter of stool 1/2 what he's used to.  Today less pain with BM but still didn't feel completely emptying.  Noticed increased frequency.  Did trial of stool softeners 09/2011, did seem to help some.  Started again 3 nights ago, daily, thinks docusate.    Lots of stress recently.  Trying to drink plenty of fluid.  Colonoscopy with Dr. Juanda Chance - 05/2006 - polyps, told didn't need to return unless having problems.  One a day brand of multivitamin caused nausea.  No recent fevers/chills, nausea/vomiting, diarrhea, blood in stool, early satiety, dysphagia.  Also with left sole tenderness, worse first steps in am.  Wt Readings from Last 3 Encounters:  10/26/11 222 lb (100.699 kg)  08/02/11 224 lb (101.606 kg)  02/10/11 225 lb (102.059 kg)    Past Medical History  Diagnosis Date  . Asthma   . History of colonic polyps   . GERD (gastroesophageal reflux disease)    Past Surgical History  Procedure Date  . Fractured c3 after fall 2008   Review of Systems Per HPI    Objective:   Physical Exam  Nursing note and vitals reviewed. Constitutional: He appears well-developed and well-nourished. No distress.  HENT:  Head: Normocephalic and atraumatic.  Mouth/Throat: Oropharynx is clear and moist. No oropharyngeal exudate.  Eyes: Conjunctivae and EOM are normal. Pupils are equal, round, and reactive to light.  Cardiovascular: Normal rate, regular rhythm, normal heart sounds and intact distal pulses.   No murmur heard. Pulmonary/Chest: Effort normal and breath sounds normal. No respiratory distress. He has no wheezes. He has no rales.    Abdominal: Soft. Bowel sounds are normal. He exhibits no distension and no mass. There is no hepatosplenomegaly. There is no tenderness. There is no rebound, no guarding and no CVA tenderness.  Genitourinary: Rectum normal and prostate normal. Rectal exam shows no external hemorrhoid, no internal hemorrhoid, no fissure, no mass, no tenderness and anal tone normal. Guaiac negative stool. Prostate is not enlarged (20gm) and not tender.       Some rectal fullness but no evident masses. No stool in vault.  Musculoskeletal:       Tender to palpation sole of left heel       Assessment & Plan:

## 2011-10-26 NOTE — Telephone Encounter (Signed)
Ok by me. Thanks.

## 2011-10-26 NOTE — Telephone Encounter (Signed)
Mr. Terrence Middleton would like to transfer to Dr. Sharen Hones. Is this ok?

## 2011-10-26 NOTE — Telephone Encounter (Signed)
Terrence Middleton would like to transfer to you instead of seeing Dr. Alphonsus Sias. Is this ok with you?

## 2011-10-26 NOTE — Patient Instructions (Addendum)
Pass by Marion's office to schedule referral for GI doctor evaluation given bowel movement changes. Urine checked today. Prostate feeling ok today. Continue colace daily. Start miralax 1 capful daily. Good to see you today, call us with questions.

## 2011-10-26 NOTE — Telephone Encounter (Signed)
Spoke with Asher Muir and scheduled patient on 10/27/11 at 2:15 PM with Dr. Juanda Chance.

## 2011-10-26 NOTE — Telephone Encounter (Signed)
Okay with me I haven't seen him very much anyway

## 2011-10-26 NOTE — Assessment & Plan Note (Signed)
Recommended NSAIDs, ice bath or frozen water bottle massage, and stretching exercises from SM pt advisor provided. Discussed importance of stretching exercises.

## 2011-10-27 ENCOUNTER — Other Ambulatory Visit: Payer: 59

## 2011-10-27 ENCOUNTER — Encounter: Payer: Self-pay | Admitting: Internal Medicine

## 2011-10-27 ENCOUNTER — Ambulatory Visit (INDEPENDENT_AMBULATORY_CARE_PROVIDER_SITE_OTHER): Payer: 59 | Admitting: Internal Medicine

## 2011-10-27 VITALS — BP 100/70 | HR 80 | Ht 71.25 in | Wt 219.6 lb

## 2011-10-27 DIAGNOSIS — R109 Unspecified abdominal pain: Secondary | ICD-10-CM

## 2011-10-27 DIAGNOSIS — R198 Other specified symptoms and signs involving the digestive system and abdomen: Secondary | ICD-10-CM

## 2011-10-27 MED ORDER — MOVIPREP 100 G PO SOLR: ORAL | Status: DC

## 2011-10-27 MED ORDER — DICYCLOMINE HCL 20 MG PO TABS: 20.0000 mg | ORAL_TABLET | Freq: Two times a day (BID) | ORAL | Status: DC

## 2011-10-27 NOTE — Progress Notes (Signed)
Terrence Middleton 12-12-1950 MRN [id number]  History of Present Illness:  This is a 61 year old white male with a change in bowel habits to having small caliber stools 3-4 times a day with some difficulty evacuating. There is lower abdominal discomfort and lower back pain in the right sacroiliac area radiating to his right leg. He denies any rectal bleeding. His son has Hermansky Pudlak syndrome consisting off Crohn's disease, blindness and albinism. 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 His recall has been changed to March 2015. He had Hemoccult negative stool in Dr. Morton Stall' office. There is no family history of colon cancer.   Past Medical History  Diagnosis Date  . Asthma   . History of colonic polyps 2008    polypoid mucosa  . GERD (gastroesophageal reflux disease)    Past Surgical History  Procedure Date  . Fractured c3 after fall 2008  . Colonoscopy 05/2006    colon polyps    reports that he has never smoked. He has never used smokeless tobacco. He reports that he drinks alcohol. He reports that he does not use illicit drugs. family history includes Asthma in his mother; COPD in his mother; Crohn's disease in his son; Emphysema in his father; and Lymphoma (age of onset:70) in his father. Allergies  Allergen Reactions  . Sulfonamide Derivatives         Review of Systems:Denies any upper GI symptoms of nausea vomiting heartburn  The remainder of the 10 point ROS is negative except as outlined in H&P   Physical Exam: General appearance  Well developed, in no distress. Eyes- non icteric. HEENT nontraumatic, normocephalic. Mouth no lesions, tongue papillated, no cheilosis. Neck supple without adenopathy, thyroid not enlarged, no carotid bruits, no JVD. Lungs Clear to auscultation bilaterally. Cor normal S1, normal S2, regular rhythm, no murmur,  quiet precordium. Abdomen: Soft nontender  abdomen. No distention. Normal active bowel sounds.  Rectal:Soft Hemoccult negative stool.  Extremities no pedal edema. Skin no lesions. Neurological alert and oriented x 3. Psychological normal mood and affect.  Assessment and Plan:  Problem #13 61 year old white male with a change in bowel habits. He is having more frequent stools with narrow caliber 3-4 times per day and difficult evacuation. Differential diagnosis includes irritable bowel syndrome.and  possibility of obstruction either from severe diverticular disease or from a malignant process which is less likely since he is Hemoccult negative amd since he had a colonoscopy 5 years ago. I would like to put him on a trial of Bentyl 20 mg twice a day as an antispasmodic and advised him to increase the fiber intake of his food and increase his activity level. He has gained about 20 pounds in the last several years. We will tentatively schedule a colonoscopy for mid-October 2013 in case the symptoms continue. We are also checking his sprue profile today. He does not have any symptoms that would suggest Crohn's disease.   10/27/2011 Lina Sar

## 2011-10-27 NOTE — Patient Instructions (Addendum)
You have been scheduled for a colonoscopy with propofol. Please follow written instructions given to you at your visit today.  Please pick up your prep kit at the pharmacy within the next 1-3 days. If you use inhalers (even only as needed), please bring them with you on the day of your procedure. We have sent the following medications to your pharmacy for you to pick up at your convenience: Bentyl Please call us 2 weeks before your scheduled colonoscopy with an update on your condition at [phone]. Your physician has requested that you go to the basement for the following lab work before leaving today: Celiac 10 Panel CC: Dr Eustaquio Boyden

## 2011-10-28 ENCOUNTER — Encounter: Payer: Self-pay | Admitting: Internal Medicine

## 2011-10-28 LAB — CELIAC PANEL 10
Gliadin IgG: 3.8 U/mL (ref <20)
IgA: 403 mg/dL — ABNORMAL HIGH (ref 68–379)

## 2011-10-30 ENCOUNTER — Other Ambulatory Visit: Payer: Self-pay | Admitting: Internal Medicine

## 2011-11-04 ENCOUNTER — Other Ambulatory Visit (INDEPENDENT_AMBULATORY_CARE_PROVIDER_SITE_OTHER): Payer: 59

## 2011-11-04 ENCOUNTER — Other Ambulatory Visit: Payer: Self-pay | Admitting: Family Medicine

## 2011-11-04 DIAGNOSIS — Z125 Encounter for screening for malignant neoplasm of prostate: Secondary | ICD-10-CM

## 2011-11-04 DIAGNOSIS — Z0000 Encounter for general adult medical examination without abnormal findings: Secondary | ICD-10-CM

## 2011-11-04 LAB — COMPREHENSIVE METABOLIC PANEL
Albumin: 4 g/dL (ref 3.5–5.2)
Alkaline Phosphatase: 42 U/L (ref 39–117)
BUN: 12 mg/dL (ref 6–23)
Calcium: 8.8 mg/dL (ref 8.4–10.5)
Chloride: 107 mEq/L (ref 96–112)
Glucose, Bld: 88 mg/dL (ref 70–99)
Potassium: 4.4 mEq/L (ref 3.5–5.1)

## 2011-11-04 LAB — CBC WITH DIFFERENTIAL/PLATELET
Basophils Relative: 0.6 % (ref 0.0–3.0)
Eosinophils Absolute: 0.2 10*3/uL (ref 0.0–0.7)
HCT: 42.5 % (ref 39.0–52.0)
Hemoglobin: 13.8 g/dL (ref 13.0–17.0)
MCHC: 32.6 g/dL (ref 30.0–36.0)
MCV: 80.2 fl (ref 78.0–100.0)
Monocytes Absolute: 0.5 10*3/uL (ref 0.1–1.0)
Neutro Abs: 2.5 10*3/uL (ref 1.4–7.7)
RBC: 5.29 Mil/uL (ref 4.22–5.81)

## 2011-11-04 LAB — LIPID PANEL
Cholesterol: 139 mg/dL (ref 0–200)
Triglycerides: 62 mg/dL (ref 0.0–149.0)

## 2011-11-08 ENCOUNTER — Ambulatory Visit (INDEPENDENT_AMBULATORY_CARE_PROVIDER_SITE_OTHER): Payer: 59 | Admitting: Family Medicine

## 2011-11-08 ENCOUNTER — Encounter: Payer: Self-pay | Admitting: Family Medicine

## 2011-11-08 VITALS — BP 124/74 | HR 84 | Temp 97.8°F | Wt 217.2 lb

## 2011-11-08 DIAGNOSIS — R21 Rash and other nonspecific skin eruption: Secondary | ICD-10-CM | POA: Insufficient documentation

## 2011-11-08 NOTE — Progress Notes (Signed)
  Subjective:    Patient ID: Terrence Middleton, male    DOB: 1950/05/29, 61 y.o.   MRN: [id number]  HPI CC: rash on legs  Saturday cutting grass, wade in shallow pond with cut reed ends, yesterday started with blisters throughout legs, then today has significant rash, several areas in linear distribution.  Initially tender, now not tender or itchy.  Did feel feverish some as well as mild nausea yesterday with headache.  No rash elsewhere on body.  Past Medical History  Diagnosis Date  . Asthma   . History of colonic polyps 2008    polypoid mucosa  . GERD (gastroesophageal reflux disease)     Review of Systems Per HPI    Objective:   Physical Exam WDWN CM NAD Bilateral legs from distal to ankles to just above patella is red maculo papular rash, some in linear distribution.  Prior blisters.  Not pruritic or tender currently.  nonblanching spots.  Several look like small cuts.    Assessment & Plan:

## 2011-11-08 NOTE — Assessment & Plan Note (Signed)
Occurred after exposure to cut grass (reeds?) in brackish water. Anticipate irritant or contact reaction to recent substance exposed to. As improved erythema and warmth, will monitor for now. Discussed red flags to update [place] for abx course.

## 2011-11-08 NOTE — Patient Instructions (Signed)
Good to see you today. I think you had contact dermatitis type reaction to grass. As getting better, we will watch for now. If spreading or itching, let me know. If evidence of infection - fever returns, spreading redness, warmth, draining pus, let me know as well for antibiotic course.

## 2011-11-10 ENCOUNTER — Encounter: Payer: Self-pay | Admitting: Family Medicine

## 2011-11-10 ENCOUNTER — Ambulatory Visit (INDEPENDENT_AMBULATORY_CARE_PROVIDER_SITE_OTHER): Payer: 59 | Admitting: Family Medicine

## 2011-11-10 VITALS — BP 116/72 | HR 74 | Temp 97.6°F | Wt 219.8 lb

## 2011-11-10 DIAGNOSIS — Z0000 Encounter for general adult medical examination without abnormal findings: Secondary | ICD-10-CM

## 2011-11-10 DIAGNOSIS — R21 Rash and other nonspecific skin eruption: Secondary | ICD-10-CM

## 2011-11-10 MED ORDER — PREDNISONE 20 MG PO TABS: ORAL_TABLET | ORAL | Status: DC

## 2011-11-10 NOTE — Patient Instructions (Addendum)
Return in 1 year or as needed. Good to see you today, call us with quesitons. Take benadryl and prednisone as prescribed for rash.  Let us know if not improving as expected.

## 2011-11-10 NOTE — Progress Notes (Signed)
Subjective:    Patient ID: Terrence Middleton, male    DOB: Jun 19, 1950, 61 y.o.   MRN: [id number]  HPI CC: CPE  Terrence Middleton presents today for CPE as well as f/u of leg rash from earlier this week.  Rash doing better.  Went outside to work on drain, thinks had mosquito bites which later caused urticarial rash on bilateral shoulders.  Has been taking benadryl.  Very itchy rash.  Also has spot left posterior thigh - but wore long pants.  Sees Dr. Maple Hudson for asthma - on advair and albuterol PRN.  Beekeeper - has epi pen if gets bee bite.  Has been reacting more to bee stings.  Preventative: Colon screening - pending rpt colonoscopy next month for BM changes. Prostate screening - DRE reassuring last week.  PSA today reassuring.  discussed screening - pt would like to continue Tetanus - 2007.  Lives with wife, dog Manufacturing engineer).  Grown children. Occupation: Technical sales engineer at [employer]  Activity: no regular exercise.  Does walk at work and stays active at home.  Member at [employer]. Diet: good water, fruits/vegetables daily  Medications and allergies reviewed and updated in chart.  Past histories reviewed and updated if relevant as below. Patient Active Problem List  Diagnosis  . ALLERGIC RHINITIS  . Asthmatic bronchitis with exacerbation  . GERD  . COLONIC POLYPS, HX OF  . Allergy to insect stings  . Thrush of mouth and esophagus  . Bowel habit changes  . Plantar fasciitis  . Healthcare maintenance  . Skin rash   Past Medical History  Diagnosis Date  . Asthma     ([place])  . History of colonic polyps 2008    polypoid mucosa  . GERD (gastroesophageal reflux disease)    Past Surgical History  Procedure Date  . Fractured c3 after fall 2008  . Colonoscopy 05/2006    colon polyps   History  Substance Use Topics  . Smoking status: Never Smoker   . Smokeless tobacco: Never Used  . Alcohol Use: Yes     occasionally beer   Family History  Problem Relation Age of Onset  . Asthma  Mother   . COPD Mother   . Emphysema Father   . Lymphoma Father 51  . Crohn's disease Son     Colonectomy   Allergies  Allergen Reactions  . Sulfonamide Derivatives    Current Outpatient Prescriptions on File Prior to Visit  Medication Sig Dispense Refill  . ADVAIR DISKUS 250-50 MCG/DOSE AEPB USE 1 PUFF 2 TIMES A DAY. **RINSE MOUTH WELL AFTER USE**  60 each  PRN  . dicyclomine (BENTYL) 20 MG tablet Take 1 tablet (20 mg total) by mouth 2 (two) times daily.  60 tablet  2  . docusate sodium (COLACE) 100 MG capsule Take 100 mg by mouth daily.      Marland Kitchen EPINEPHrine (EPIPEN 2-PAK) 0.3 mg/0.3 mL DEVI Inject 0.3 mLs (0.3 mg total) into the muscle once as needed.  1 Device  11  . Naproxen Sodium (ALEVE) 220 MG CAPS Take 2 capsules by mouth as needed.      Marland Kitchen omeprazole (PRILOSEC) 20 MG capsule Take 20 mg by mouth daily.        Marland Kitchen PROAIR HFA 108 (90 BASE) MCG/ACT inhaler USE 2 PUFFS 4 TIMES A DAY AS NEEDED  8.5 g  PRN  . Sodium Chloride-Sodium Bicarb (AYR SALINE NASAL RINSE) 1.57 G PACK Place into the nose. Uses every morning      .  MOVIPREP 100 G SOLR Use per prep instructions  1 kit  0    Review of Systems  Constitutional: Negative for fever, chills, activity change, appetite change, fatigue and unexpected weight change.  HENT: Negative for hearing loss and neck pain.   Eyes: Negative for visual disturbance.  Respiratory: Negative for cough, chest tightness, shortness of breath and wheezing.   Cardiovascular: Negative for chest pain, palpitations and leg swelling.  Gastrointestinal: Negative for nausea, vomiting, abdominal pain, diarrhea, constipation, blood in stool and abdominal distention.  Genitourinary: Negative for hematuria and difficulty urinating.  Musculoskeletal: Negative for myalgias and arthralgias.  Skin: Negative for rash.  Neurological: Negative for dizziness, seizures, syncope and headaches.  Hematological: Does not bruise/bleed easily.  Psychiatric/Behavioral: Negative for  dysphoric mood. The patient is not nervous/anxious.        Objective:   Physical Exam  Nursing note and vitals reviewed. Constitutional: He is oriented to person, place, and time. He appears well-developed and well-nourished. No distress.  HENT:  Head: Normocephalic and atraumatic.  Right Ear: External ear normal.  Left Ear: External ear normal.  Nose: Nose normal.  Mouth/Throat: Oropharynx is clear and moist. No oropharyngeal exudate.  Eyes: Conjunctivae and EOM are normal. Pupils are equal, round, and reactive to light. No scleral icterus.  Neck: Normal range of motion. Neck supple.  Cardiovascular: Normal rate, regular rhythm, normal heart sounds and intact distal pulses.   No murmur heard. Pulses:      Radial pulses are 2+ on the right side, and 2+ on the left side.  Pulmonary/Chest: Effort normal and breath sounds normal. No respiratory distress. He has no wheezes. He has no rales.  Abdominal: Soft. Bowel sounds are normal. He exhibits no distension and no mass. There is no tenderness. There is no rebound and no guarding.  Musculoskeletal: Normal range of motion. He exhibits no edema.  Lymphadenopathy:    He has no cervical adenopathy.  Neurological: He is alert and oriented to person, place, and time.       CN grossly intact, station and gait intact  Skin: Skin is warm and dry. Rash noted.       Leg rash improved. Bilateral shoulders with urticarial rash, pruritic  Psychiatric: He has a normal mood and affect. His behavior is normal. Judgment and thought content normal.       Assessment & Plan:

## 2011-11-10 NOTE — Assessment & Plan Note (Signed)
Leg rash improved. Now what seems like urticaria rash present on torso - possible overly sensitive immune system currently - will treat with course of oral steroid. Update if not improving as expected.

## 2011-11-10 NOTE — Assessment & Plan Note (Signed)
Preventative protocols reviewed and updated unless pt declined. Discussed healthy diet/lifestyle- encouraged increased aerobic exercise to raise HDL. Pt would like to continue yearly prostate screening.

## 2011-11-29 ENCOUNTER — Telehealth: Payer: Self-pay | Admitting: Internal Medicine

## 2011-11-29 NOTE — Telephone Encounter (Signed)
OK not to have the colonoscopy. Please cancel and tell Dottie so she can use the spot for another pt. He will be due for recall colon 3 /2015. Please put in recall. thanx

## 2011-11-29 NOTE — Telephone Encounter (Signed)
Pt states that the medication that Dr. Juanda Chance gave him for his stomach has helped. He has been off of it now for about 2 weeks and the symptoms seem to have stopped. Pt does not feel like he needs to have the colon done now. Pt wants to know if he should just have an OV with Dr. Juanda Chance and call back when he needs the colon. Dr. Juanda Chance please advise.

## 2011-11-30 NOTE — Telephone Encounter (Signed)
Informed pt Dr Juanda Chance stated it's OK not to have the COLON and we will put in a RECALL for 05/2013. Pt stated understanding, but is concerned that if he has trouble again, what can he do? Explained to pt, if he has trouble call us and we will either schedule him for an OV or the COLON at that time.

## 2011-12-12 ENCOUNTER — Encounter: Payer: 59 | Admitting: Internal Medicine

## 2011-12-14 ENCOUNTER — Encounter: Payer: 59 | Admitting: Internal Medicine

## 2012-02-16 ENCOUNTER — Ambulatory Visit (INDEPENDENT_AMBULATORY_CARE_PROVIDER_SITE_OTHER): Payer: 59 | Admitting: Internal Medicine

## 2012-02-16 ENCOUNTER — Ambulatory Visit: Payer: 59 | Admitting: Internal Medicine

## 2012-02-16 ENCOUNTER — Other Ambulatory Visit: Payer: 59

## 2012-02-16 ENCOUNTER — Encounter: Payer: Self-pay | Admitting: Internal Medicine

## 2012-02-16 ENCOUNTER — Other Ambulatory Visit: Payer: Self-pay | Admitting: Internal Medicine

## 2012-02-16 VITALS — BP 126/70 | HR 72 | Ht 70.5 in | Wt 222.2 lb

## 2012-02-16 DIAGNOSIS — I872 Venous insufficiency (chronic) (peripheral): Secondary | ICD-10-CM

## 2012-02-16 DIAGNOSIS — J309 Allergic rhinitis, unspecified: Secondary | ICD-10-CM

## 2012-02-16 DIAGNOSIS — Z91038 Other insect allergy status: Secondary | ICD-10-CM

## 2012-02-16 DIAGNOSIS — R609 Edema, unspecified: Secondary | ICD-10-CM

## 2012-02-16 DIAGNOSIS — J45909 Unspecified asthma, uncomplicated: Secondary | ICD-10-CM

## 2012-02-16 DIAGNOSIS — J45901 Unspecified asthma with (acute) exacerbation: Secondary | ICD-10-CM

## 2012-02-16 DIAGNOSIS — Z23 Encounter for immunization: Secondary | ICD-10-CM

## 2012-02-16 MED ORDER — EPINEPHRINE 0.3 MG/0.3ML IJ DEVI: 0.3000 mg | Freq: Once | INTRAMUSCULAR | Status: DC | PRN

## 2012-02-16 MED ORDER — ALBUTEROL SULFATE HFA 108 (90 BASE) MCG/ACT IN AERS: 2.0000 | INHALATION_SPRAY | RESPIRATORY_TRACT | Status: DC | PRN

## 2012-02-16 MED ORDER — FLUTICASONE-SALMETEROL 250-50 MCG/DOSE IN AEPB: 1.0000 | INHALATION_SPRAY | Freq: Two times a day (BID) | RESPIRATORY_TRACT | Status: DC

## 2012-02-16 NOTE — Progress Notes (Signed)
02/10/11- 60 yoM never smoker followed for asthma, allergic rhinitis, complicated by GERD, history of insect venom allergy LOV-02/11/2010 Nurse is refilling meds as reviewed. He gets flu vaccine at work. Has had a very good year from her breathing and allergy standpoint. Just in the last 3 weeks, had sore throat and head cold with mild bronchitis and some transient wheezing. He uses Advair regularly and only very occasionally needs a rescue inhaler mainly before exercise. He does find he needs to continue daily omeprazole. 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 He has reacted locally to honeybee and to yellow jacket.  08/02/11- 60 yoM never smoker followed for asthma, allergic rhinitis, complicated by GERD, history of insect venom allergy ACUTE VISIT: about 6 wks ago took Zpak, then came back and was given Biaxin and Prednisone. Has completed few days ago-developed growth in mouth(went to see dentist-Thrush)took Nystatin(currently on as well). Gotten worse since abx and pred-coughing-deep in chest, dizziness as well., pressure in ears, heaviness in chest. Feels that he is relapsing. No energy. Deep cough with rattling in chest. Brown mucous plugs. No fever, blood or pain.  ROS-see HPI Constitutional:   No-   weight loss, night sweats, fevers, chills, fatigue, lassitude. HEENT:   No-  headaches, difficulty swallowing, tooth/dental problems, sore throat,       No-  sneezing, itching, ear ache, nasal congestion, post nasal drip,  CV:  No-   chest pain, orthopnea, PND, swelling in lower extremities, anasarca, dizziness, palpitations Resp: No-   shortness of breath with exertion or at rest.              No-   productive cough,  No non-productive cough,  No- coughing up of blood.              No-   change in color of mucus.  + wheezing.   Skin: No-   rash or lesions. GI:  No-   heartburn, indigestion, abdominal pain, nausea, vomiting,  GU: MS:   . Neuro-     nothing unusual Psych:  No- change in mood or affect. No depression or anxiety.  No memory loss.  OBJ General- Alert, Oriented, Affect-appropriate, Distress- none acute Skin- rash-none, lesions- none, excoriation- none Lymphadenopathy- none Head- atraumatic            Eyes- Gross vision intact, PERRLA, conjunctivae clear secretions            Ears- Hearing, canals-normal            Nose- Clear, no-Septal dev, mucus, polyps, erosion, perforation             Throat- Mallampati II , mucosa clear , drainage- none, tonsils- atrophic. Mild thrush Neck- flexible , trachea midline, no stridor , thyroid nl, carotid no bruit Chest - symmetrical excursion , unlabored           Heart/CV- RRR , no murmur , no gallop  , no rub, nl s1 s2                           - JVD- none , edema- none, stasis changes- none, varices- none           Lung- wheezy cough with deep breath , dullness-none, rub- none           Chest wall-  Abd-  Br/ Gen/ Rectal- Not done, not indicated Extrem- cyanosis-  none, clubbing, none, atrophy- none, strength- nl Neuro- grossly intact to observation       02/10/11- 60 yoM never smoker followed for asthma, allergic rhinitis, complicated by GERD, history of insect venom allergy LOV-02/11/2010 Nurse is refilling meds as reviewed. He gets flu vaccine at work. Has had a very good year from her breathing and allergy standpoint. Just in the last 3 weeks, had sore throat and head cold with mild bronchitis and some transient wheezing. He uses Advair regularly and only very occasionally needs a rescue inhaler mainly before exercise. He does find he needs to continue daily omeprazole. 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 He has reacted locally to honeybee and to yellow jacket.  08/02/11- 60 yoM never smoker followed for asthma, allergic rhinitis, complicated by GERD, history of insect venom allergy ACUTE VISIT: about 6 wks  ago took Zpak, then came back and was given Biaxin and Prednisone. Has completed few days ago-developed growth in mouth(went to see dentist-Thrush)took Nystatin(currently on as well). Gotten worse since abx and pred-coughing-deep in chest, dizziness as well., pressure in ears, heaviness in chest. Feels that he is relapsing. No energy. Deep cough with rattling in chest. Brown mucous plugs. No fever, blood or pain.  02/16/12- 61 yoM never smoker followed for asthma, allergic rhinitis, complicated by GERD, history of insect venom allergy FOLLOWS FOR: states no SOB, wheezing, coughing, or congestion more than normal (if having a cold) He reports a couple of minor episodes of chest tightness only. He took prednisone with antibiotic after his legs were cut with a weed eater.  ROS-see HPI Constitutional:   No-   weight loss, night sweats, fevers, chills, fatigue, lassitude. HEENT:   No-  headaches, difficulty swallowing, tooth/dental problems, sore throat,       No-  sneezing, itching, ear ache, nasal congestion, post nasal drip,  CV:  No-   chest pain, orthopnea, PND, swelling in lower extremities, anasarca, dizziness, palpitations Resp: No-   shortness of breath with exertion or at rest.              No-   productive cough,  No non-productive cough,  No- coughing up of blood.              No-   change in color of mucus.  + wheezing.   Skin: No-   rash or lesions. GI:  No-   heartburn, indigestion, abdominal pain, nausea, vomiting,  GU: MS:  . Neuro-     nothing unusual Psych:  No- change in mood or affect. No depression or anxiety.  No memory loss.  OBJ General- Alert, Oriented, Affect-appropriate, Distress- none acute Skin- rash-none, lesions- none, excoriation- none Lymphadenopathy- none Head- atraumatic            Eyes- Gross vision intact, PERRLA, conjunctivae clear secretions            Ears- Hearing, canals-normal            Nose- Clear, no-Septal dev, mucus, polyps, erosion,  perforation             Throat- Mallampati II , coated tongue , drainage- none, tonsils- atrophic. Mild thrush Neck- flexible , trachea midline, no stridor , thyroid nl, carotid no bruit Chest - symmetrical excursion , unlabored           Heart/CV- RRR , no murmur , no gallop  , no rub, nl s1 s2                           -  JVD- none , edema+/ pitting, stasis changes- none, varices- none           Lung- very clear, unlabored , dullness-none, rub- none           Chest wall-  Abd-  Br/ Gen/ Rectal- Not done, not indicated Extrem- cyanosis- none, clubbing, none, atrophy- none, strength- nl. Neg-Homan's Neuro- grossly intact to observation

## 2012-02-16 NOTE — Patient Instructions (Addendum)
Scripts sent for rescue inhaler, Advair, Epipen  Order- referral to vascular surgery clinic - dx peripheral venous insufficiency  Flu vax  Order- lab- alpha 1- antitrypsin level  Dx asthma

## 2012-02-22 LAB — ALPHA-1 ANTITRYPSIN PHENOTYPE: A-1 Antitrypsin: 119 mg/dL (ref 83–199)

## 2012-02-24 NOTE — Assessment & Plan Note (Signed)
Exam suggest chronic venous insufficiency. He has no personal or family history of venous thromboembolic disease. Plan-refer to vascular surgery for evaluation of peripheral leg veins. Meanwhile he is encouraged to elevate legs when sitting and to avoid stasis.

## 2012-02-24 NOTE — Assessment & Plan Note (Signed)
Plan-sting avoidance, EpiPen. Educated on availability of them testing and treatment available elsewhere.

## 2012-02-24 NOTE — Assessment & Plan Note (Signed)
Well-controlled with no acute problem

## 2012-02-24 NOTE — Assessment & Plan Note (Signed)
Controlled asthma. Plan-check alpha-1 antitrypsin level since father had emphysema but was not a heavy smoker. Refill pro air, Advair Flu vaccine

## 2012-03-13 ENCOUNTER — Other Ambulatory Visit: Payer: Self-pay | Admitting: *Deleted

## 2012-03-13 DIAGNOSIS — R609 Edema, unspecified: Secondary | ICD-10-CM

## 2012-03-20 ENCOUNTER — Encounter: Payer: Self-pay | Admitting: Vascular Surgery

## 2012-03-21 ENCOUNTER — Encounter: Payer: Self-pay | Admitting: Vascular Surgery

## 2012-03-21 ENCOUNTER — Encounter (INDEPENDENT_AMBULATORY_CARE_PROVIDER_SITE_OTHER): Payer: 59 | Admitting: *Deleted

## 2012-03-21 ENCOUNTER — Ambulatory Visit (INDEPENDENT_AMBULATORY_CARE_PROVIDER_SITE_OTHER): Payer: 59 | Admitting: Vascular Surgery

## 2012-03-21 VITALS — BP 115/68 | HR 83 | Ht 70.5 in | Wt 221.0 lb

## 2012-03-21 DIAGNOSIS — I83893 Varicose veins of bilateral lower extremities with other complications: Secondary | ICD-10-CM | POA: Insufficient documentation

## 2012-03-21 DIAGNOSIS — R609 Edema, unspecified: Secondary | ICD-10-CM

## 2012-03-21 NOTE — Progress Notes (Signed)
[facility]  Patient name: Terrence Middleton MRN: [id number] DOB: [birth year] Sex: male  REASON FOR CONSULT: bilateral lower extremity swelling and varicose veins. Referred by Dr. Jetty Duhamel  HPI: Terrence Middleton is a 62 y.o. male who states that he has had swelling in both lower extremities for many years. His swelling is worse at the end of the day after he has been sitting or standing. He spends most of his time at work sitting but also does some walking. Recently he has been involved with projects that required more standing. The swelling is worse at the end of the day. The swelling is at its best when he wakes up in the morning. He has also had some varicose veins but denies any history of phlebitis, burning, itching or throbbing associated with his varicose veins. He is unaware of any history of DVT. He denies any history of abdominal surgery or radiation therapy.  I have reviewed his records from Dr. Roxy Cedar office. He does have a history of asthma which has been stable on his current medications.  Past Medical History  Diagnosis Date  . Asthma     (Young)  . History of colonic polyps 2008    polypoid mucosa  . GERD (gastroesophageal reflux disease)     Family History  Problem Relation Age of Onset  . Asthma Mother   . COPD Mother   . Emphysema Father   . Lymphoma Father 79  . Crohn's disease Son     Colonectomy    SOCIAL HISTORY: History  Substance Use Topics  . Smoking status: Never Smoker   . Smokeless tobacco: Never Used  . Alcohol Use: Yes     Comment: occasionally beer    Allergies  Allergen Reactions  . Sulfonamide Derivatives Other (See Comments)    Pt states he has stomach pain with these drugs    Current Outpatient Prescriptions  Medication Sig Dispense Refill  . albuterol (PROAIR HFA) 108 (90 BASE) MCG/ACT inhaler Inhale 2 puffs into the lungs every 4 (four) hours as needed for wheezing or shortness of breath.  8.5 g   PRN  . EPINEPHrine (EPIPEN 2-PAK) 0.3 mg/0.3 mL DEVI Inject 0.3 mLs (0.3 mg total) into the muscle once as needed.  1 Device  prn  . Fluticasone-Salmeterol (ADVAIR DISKUS) 250-50 MCG/DOSE AEPB Inhale 1 puff into the lungs 2 (two) times daily.  60 each  PRN  . Naproxen Sodium (ALEVE) 220 MG CAPS Take 2 capsules by mouth as needed.      . Sodium Chloride-Sodium Bicarb (AYR SALINE NASAL RINSE) 1.57 G PACK Place into the nose. Uses every morning      . dicyclomine (BENTYL) 20 MG tablet Take 1 tablet (20 mg total) by mouth 2 (two) times daily.  60 tablet  2  . docusate sodium (COLACE) 100 MG capsule Take 100 mg by mouth daily.      Marland Kitchen omeprazole (PRILOSEC) 20 MG capsule Take 20 mg by mouth daily.          REVIEW OF SYSTEMS: Arly.Keller ] denotes positive finding; [  ] denotes negative finding  CARDIOVASCULAR:  [ ]  chest pain   [ ]  chest pressure   [ ]  palpitations   [ ]  orthopnea   [ ]  dyspnea on exertion   [ ]  claudication   [ ]  rest pain   [ ]  DVT   [ ]  phlebitis PULMONARY:   [ ]  productive cough   [ ]   asthma   [ ]  wheezing NEUROLOGIC:   [ ]  weakness  [ ]  paresthesias  [ ]  aphasia  [ ]  amaurosis  [ ]  dizziness HEMATOLOGIC:   [ ]  bleeding problems   [ ]  clotting disorders MUSCULOSKELETAL:  [ ]  joint pain   [ ]  joint swelling Arly.Keller ] leg swelling- bilateral GASTROINTESTINAL: [ ]   blood in stool  [ ]   hematemesis GENITOURINARY:  [ ]   dysuria  [ ]   hematuria PSYCHIATRIC:  [ ]  history of major depression INTEGUMENTARY:  [ ]  rashes  [ ]  ulcers CONSTITUTIONAL:  [ ]  fever   [ ]  chills  PHYSICAL EXAM: Filed Vitals:   03/21/12 1541  BP: 115/68  Pulse: 83  Height: 5' 10.5" (1.791 m)  Weight: 221 lb (100.245 kg)  SpO2: 98%   Body mass index is 31.26 kg/(m^2). GENERAL: The patient is a well-nourished male, in no acute distress. The vital signs are documented above. CARDIOVASCULAR: There is a regular rate and rhythm. I do not detect carotid bruits. He has palpable femoral, popliteal, and pedal pulses  bilaterally. He has mild bilateral lower extremity swelling. PULMONARY: There is good air exchange bilaterally without wheezing or rales. ABDOMEN: Soft and non-tender with normal pitched bowel sounds. I cannot palpate an abdominal aortic aneurysm. MUSCULOSKELETAL: There are no major deformities or cyanosis. NEUROLOGIC: No focal weakness or paresthesias are detected. SKIN: There are no ulcers or rashes noted. There is no hyperpigmentation or venous ulceration. PSYCHIATRIC: The patient has a normal affect.  DATA:  I have independently interpreted his venous duplex scan which shows only mild reflux in the left greater saphenous vein with some mild reflux in the left common femoral vein also. He has no DVT on the left. Likewise, on the right side he has only mild reflux in the greater saphenous vein and some mild reflux in the right superficial femoral vein.  MEDICAL ISSUES: This patient has evidence of mild chronic venous insufficiency bilaterally. I do not see any evidence of lymphedema. I've discussed the importance of intermittent leg elevation and the proper positioning for this. I have also written him a prescription for knee-high compression stockings with a gradient of 15-20 mm of mercury. I have encouraged him to avoid prolonged standing and sitting. I've encouraged him to walk as much as possible. I do not think that he would be a good candidate for laser ablation the saphenous vein as he has only mild reflux bilaterally. He has no evidence of significant venous insufficiency such as hyperpigmentation or ulceration. I'll be happy to see him back at any time if his symptoms worsen.   DICKSON,CHRISTOPHER S [facility]: [phone]

## 2012-06-19 ENCOUNTER — Ambulatory Visit: Payer: 59 | Admitting: Family Medicine

## 2012-06-19 ENCOUNTER — Encounter: Payer: Self-pay | Admitting: Family Medicine

## 2012-06-19 ENCOUNTER — Ambulatory Visit (INDEPENDENT_AMBULATORY_CARE_PROVIDER_SITE_OTHER): Payer: 59 | Admitting: Family Medicine

## 2012-06-19 VITALS — BP 104/60 | HR 72 | Temp 97.7°F | Wt 219.5 lb

## 2012-06-19 DIAGNOSIS — Z91038 Other insect allergy status: Secondary | ICD-10-CM

## 2012-06-19 DIAGNOSIS — T63441A Toxic effect of venom of bees, accidental (unintentional), initial encounter: Secondary | ICD-10-CM | POA: Insufficient documentation

## 2012-06-19 DIAGNOSIS — T6391XA Toxic effect of contact with unspecified venomous animal, accidental (unintentional), initial encounter: Secondary | ICD-10-CM

## 2012-06-19 DIAGNOSIS — T63461A Toxic effect of venom of wasps, accidental (unintentional), initial encounter: Secondary | ICD-10-CM

## 2012-06-19 MED ORDER — PREDNISONE 20 MG PO TABS: ORAL_TABLET | ORAL | Status: DC

## 2012-06-19 NOTE — Assessment & Plan Note (Signed)
Anticipate localized allergic reaction, not anaphylaxis. Provided with steroid to fill if not improving as expected. Has epi pen available if needed. Aware of things to do to minimize risk. Discussed continue benadryl, zantac and cool compress use.

## 2012-06-19 NOTE — Progress Notes (Signed)
  Subjective:    Patient ID: Terrence Middleton, male    DOB: May 24, 1950, 62 y.o.   MRN: [id number]  HPI CC: bee sting  Bee sting occurred Sunday night on right upper medial palpebral ridge. Caused R eye swelling, entire R side of face swelling. Yesterday swelling spread to left side of face. Also with headache.   Slowly today swelling improving.  Took benadryl right away and used cold compress which helped swelling. Some persistent itching.  No nausea, tongue or throat swelling, dyspnea, vision changes.  Has not been stung on face before.  Notices that extremity stings are causing worse localized reactions than in the past. Never had anaphylactic reaction to bees.  Past Medical History  Diagnosis Date  . Asthma     (Young)  . History of colonic polyps 2008    polypoid mucosa  . GERD (gastroesophageal reflux disease)     Review of Systems Per HPI    Objective:   Physical Exam WDWN CM, NAD Evident periorbital swelling R>L     Assessment & Plan:

## 2012-06-19 NOTE — Patient Instructions (Signed)
Continue benadryl and ice or cold compresses as needed. Prescription for steroids provided in case needed. If you use epi pen, call 911. Bee, Wasp, or Hornet Sting Your caregiver has diagnosed you as having an insect sting. An insect sting appears as a red lump in the skin that sometimes has a tiny hole in the center, or it may have a stinger in the center of the wound. The most common stings are from wasps, hornets and bees. Individuals have different reactions to insect stings.  A normal reaction may cause pain, swelling, and redness around the sting site.  A localized allergic reaction may cause swelling and redness that extends beyond the sting site.  A large local reaction may continue to develop over the next 12 to 36 hours.  On occasion, the reactions can be severe (anaphylactic reaction). An anaphylactic reaction may cause wheezing; difficulty breathing; chest pain; fainting; raised, itchy, red patches on the skin; a sick feeling to your stomach (nausea); vomiting; cramping; or diarrhea. If you have had an anaphylactic reaction to an insect sting in the past, you are more likely to have one again. HOME CARE INSTRUCTIONS   With bee stings, a small sac of poison is left in the wound. Brushing across this with something such as a credit card, or anything similar, will help remove this and decrease the amount of the reaction. This same procedure will not help a wasp sting as they do not leave behind a stinger and poison sac.  Apply a cold compress for 10 to 20 minutes every hour for 1 to 2 days, depending on severity, to reduce swelling and itching.  To lessen pain, a paste made of water and baking soda may be rubbed on the bite or sting and left on for 5 minutes.  To relieve itching and swelling, you may use take medication or apply medicated creams or lotions as directed.  Only take over-the-counter or prescription medicines for pain, discomfort, or fever as directed by your  caregiver.  Wash the sting site daily with soap and water. Apply antibiotic ointment on the sting site as directed.  If you suffered a severe reaction:  If you did not require hospitalization, an adult will need to stay with you for 24 hours in case the symptoms return.  You may need to wear a medical bracelet or necklace stating the allergy.  You and your family need to learn when and how to use an anaphylaxis kit or epinephrine injection.  If you have had a severe reaction before, always carry your anaphylaxis kit with you. SEEK MEDICAL CARE IF:   None of the above helps within 2 to 3 days.  The area becomes red, warm, tender, and swollen beyond the area of the bite or sting.  You have an oral temperature above 102 F (38.9 C). SEEK IMMEDIATE MEDICAL CARE IF:  You have symptoms of an allergic reaction which are:  Wheezing.  Difficulty breathing.  Chest pain.  Lightheadedness or fainting.  Itchy, raised, red patches on the skin.  Nausea, vomiting, cramping or diarrhea. ANY OF THESE SYMPTOMS MAY REPRESENT A SERIOUS PROBLEM THAT IS AN EMERGENCY. Do not wait to see if the symptoms will go away. Get medical help right away. Call your local emergency services (911 in [place] DO NOT drive yourself to the hospital. MAKE SURE YOU:   Understand these instructions.  Will watch your condition.  Will get help right away if you are not doing well or get worse. Document  Released: 02/21/2005 Document Revised: 05/16/2011 Document Reviewed: 08/08/2009 [facility] Patient Information 2013 [place], [place].

## 2012-11-12 ENCOUNTER — Encounter: Payer: Self-pay | Admitting: Family Medicine

## 2012-11-12 ENCOUNTER — Ambulatory Visit (INDEPENDENT_AMBULATORY_CARE_PROVIDER_SITE_OTHER): Payer: 59 | Admitting: Family Medicine

## 2012-11-12 VITALS — BP 110/70 | HR 72 | Temp 98.3°F | Ht 71.0 in | Wt 210.5 lb

## 2012-11-12 DIAGNOSIS — M25562 Pain in left knee: Secondary | ICD-10-CM | POA: Insufficient documentation

## 2012-11-12 DIAGNOSIS — Z0000 Encounter for general adult medical examination without abnormal findings: Secondary | ICD-10-CM

## 2012-11-12 DIAGNOSIS — Z125 Encounter for screening for malignant neoplasm of prostate: Secondary | ICD-10-CM

## 2012-11-12 DIAGNOSIS — E785 Hyperlipidemia, unspecified: Secondary | ICD-10-CM | POA: Insufficient documentation

## 2012-11-12 DIAGNOSIS — Z23 Encounter for immunization: Secondary | ICD-10-CM

## 2012-11-12 DIAGNOSIS — Z2911 Encounter for prophylactic immunotherapy for respiratory syncytial virus (RSV): Secondary | ICD-10-CM

## 2012-11-12 LAB — BASIC METABOLIC PANEL
Calcium: 8.7 mg/dL (ref 8.4–10.5)
GFR: 92.53 mL/min (ref >60.00)
Glucose, Bld: 100 mg/dL — ABNORMAL HIGH (ref 70–99)
Sodium: 140 mEq/L (ref 135–145)

## 2012-11-12 LAB — LIPID PANEL
HDL: 32.8 mg/dL — ABNORMAL LOW (ref >39.00)
LDL Cholesterol: 88 mg/dL (ref 0–99)
Total CHOL/HDL Ratio: 4
VLDL: 9.2 mg/dL (ref 0.0–40.0)

## 2012-11-12 LAB — TSH: TSH: 1.34 u[IU]/mL (ref 0.35–5.50)

## 2012-11-12 NOTE — Assessment & Plan Note (Signed)
Prior eval by work MD thought consistent with knee strain, story sounds consistent with meniscal injury however testing overall stable. Advised continue to monitor with prn aleve as needed, if not improving as expected, return for further evaluation. Pt agrees with plan.

## 2012-11-12 NOTE — Progress Notes (Signed)
Subjective:    Patient ID: Terrence Middleton, male    DOB: 11/30/1950, 62 y.o.   MRN: [id number]  HPI CC: CPE  GERD - controlled well with omeprazole 20mg  daily. H/o abd discomfort, improved on bentyl.  Considering being kidney donor for son who will require kidney and bilateral lung transplant. Lab Results  Component Value Date   CREATININE 1.0 11/04/2011    Trying to lose weight - increased exercise, changing dietary habits. Fell in son's garage 5 wks ago - significant valgus injury to L knee.  Saw [facility] at [facility], thought bad knee sprain.  Treated with aleve bid.  Describes pain around knee and medial knee.  Unable to run on knee.  Is getting better. Some locking and giving out Wt Readings from Last 3 Encounters:  11/12/12 210 lb 8 oz (95.482 kg)  06/19/12 219 lb 8 oz (99.565 kg)  03/21/12 221 lb (100.245 kg)  Body mass index is 29.37 kg/(m^2).  Preventative:  Colon screening - colonoscopy 2008 with non-precancerous polyps Juanda Chance) Prostate screening - DRE reassuring last week. PSA today reassuring. discussed screening - pt would like to continue  Tetanus - 2007. Flu shot - today. Shingles shot - discussed. Would like today.  Lives with wife, dog Manufacturing engineer). Grown children.  Occupation: Technical sales engineer at [employer]  Activity: trying to get regular exercise. Member at [employer].  Diet: good water, fruits/vegetables daily  Medications and allergies reviewed and updated in chart.  Past histories reviewed and updated if relevant as below. Patient Active Problem List   Diagnosis Date Noted  . Bee sting 06/19/2012  . Varicose veins of lower extremities with other complications 03/21/2012  . Peripheral edema 02/16/2012  . Skin rash 11/08/2011  . Healthcare maintenance 11/04/2011  . Bowel habit changes 10/26/2011  . Plantar fasciitis 10/26/2011  . Thrush of mouth and esophagus 08/06/2011  . Allergy to insect stings 02/10/2011  . COLONIC POLYPS, HX OF 06/23/2009  . ALLERGIC  RHINITIS 02/14/2008  . Asthmatic bronchitis with exacerbation 02/14/2008  . GERD 02/14/2008   Past Medical History  Diagnosis Date  . Asthma     (Young)  . History of colonic polyps 2008    polypoid mucosa  . GERD (gastroesophageal reflux disease)    Past Surgical History  Procedure Laterality Date  . Fractured c3 after fall  2008  . Colonoscopy  05/2006    colon polyps   History  Substance Use Topics  . Smoking status: Never Smoker   . Smokeless tobacco: Never Used  . Alcohol Use: Yes     Comment: occasionally beer   Family History  Problem Relation Age of Onset  . Asthma Mother   . COPD Mother   . Emphysema Father   . Lymphoma Father 62  . Crohn's disease Son     Colonectomy   Allergies  Allergen Reactions  . Sulfonamide Derivatives Other (See Comments)    Pt states he has stomach pain with these drugs   Current Outpatient Prescriptions on File Prior to Visit  Medication Sig Dispense Refill  . albuterol (PROAIR HFA) 108 (90 BASE) MCG/ACT inhaler Inhale 2 puffs into the lungs every 4 (four) hours as needed for wheezing or shortness of breath.  8.5 g  PRN  . EPINEPHrine (EPIPEN 2-PAK) 0.3 mg/0.3 mL DEVI Inject 0.3 mLs (0.3 mg total) into the muscle once as needed.  1 Device  prn  . Fluticasone-Salmeterol (ADVAIR) 250-50 MCG/DOSE AEPB Inhale 1 puff into the lungs daily.      Marland Kitchen  Naproxen Sodium (ALEVE) 220 MG CAPS Take 2 capsules by mouth as needed.      Marland Kitchen omeprazole (PRILOSEC) 20 MG capsule Take 20 mg by mouth daily.        . Sodium Chloride-Sodium Bicarb (AYR SALINE NASAL RINSE) 1.57 G PACK Place into the nose. Uses every morning      . docusate sodium (COLACE) 100 MG capsule Take 100 mg by mouth daily.       No current facility-administered medications on file prior to visit.     Review of Systems  Constitutional: Negative for fever, chills, activity change, appetite change, fatigue and unexpected weight change.  HENT: Negative for hearing loss and neck pain.    Eyes: Negative for visual disturbance.  Respiratory: Negative for cough, chest tightness, shortness of breath and wheezing.   Cardiovascular: Positive for leg swelling (occasional). Negative for chest pain and palpitations.  Gastrointestinal: Negative for nausea, vomiting, abdominal pain, diarrhea, constipation, blood in stool and abdominal distention.  Genitourinary: Negative for hematuria and difficulty urinating.  Musculoskeletal: Negative for myalgias and arthralgias.  Skin: Negative for rash.  Neurological: Negative for dizziness, seizures, syncope and headaches.  Hematological: Negative for adenopathy. Does not bruise/bleed easily.  Psychiatric/Behavioral: Negative for dysphoric mood. The patient is nervous/anxious (family issues).        Objective:   Physical Exam  Nursing note and vitals reviewed. Constitutional: He is oriented to person, place, and time. He appears well-developed and well-nourished. No distress.  HENT:  Head: Normocephalic and atraumatic.  Right Ear: Hearing, tympanic membrane, external ear and ear canal normal.  Left Ear: Hearing, tympanic membrane, external ear and ear canal normal.  Nose: Nose normal.  Mouth/Throat: Oropharynx is clear and moist. No oropharyngeal exudate.  Eyes: Conjunctivae and EOM are normal. Pupils are equal, round, and reactive to light. No scleral icterus.  Neck: Normal range of motion. Neck supple. No thyromegaly present.  Cardiovascular: Normal rate, regular rhythm, normal heart sounds and intact distal pulses.   No murmur heard. Pulses:      Radial pulses are 2+ on the right side, and 2+ on the left side.  Pulmonary/Chest: Effort normal and breath sounds normal. No respiratory distress. He has no wheezes. He has no rales.  Abdominal: Soft. Bowel sounds are normal. He exhibits no distension and no mass. There is no tenderness. There is no rebound and no guarding.  Genitourinary: Rectum normal and prostate normal. Rectal exam shows  no external hemorrhoid, no internal hemorrhoid, no fissure, no mass, no tenderness and anal tone normal. Prostate is not enlarged (15gm) and not tender.  Musculoskeletal: Normal range of motion. He exhibits edema (LLE 1+ pitting).  R knee WNL L knee - no pain with drawer test, FROM at knee, no pain with valgus/varus testing or mcmurray's test.  Mildly tender along medial and lateral joint lines. No PFgrind, no abnormal patellar mobility  Lymphadenopathy:    He has no cervical adenopathy.  Neurological: He is alert and oriented to person, place, and time.  CN grossly intact, station and gait intact  Skin: Skin is warm and dry. No rash noted.  Psychiatric: He has a normal mood and affect. His behavior is normal. Judgment and thought content normal.       Assessment & Plan:

## 2012-11-12 NOTE — Assessment & Plan Note (Addendum)
Preventative protocols reviewed and updated unless pt declined. Discussed healthy diet and lifestyle.  Shingles shot today per patient request.  Flu shot today.

## 2012-11-12 NOTE — Patient Instructions (Addendum)
Shingles shot today.  Flu shot today. Blood work today Let's keep an eye on knee - should continue improving on its own, if not let me know. Good to see you today, call us with questions.

## 2013-02-15 ENCOUNTER — Ambulatory Visit (INDEPENDENT_AMBULATORY_CARE_PROVIDER_SITE_OTHER): Payer: 59 | Admitting: Internal Medicine

## 2013-02-15 ENCOUNTER — Encounter: Payer: Self-pay | Admitting: Internal Medicine

## 2013-02-15 VITALS — BP 116/76 | HR 80 | Ht 70.5 in | Wt 218.0 lb

## 2013-02-15 DIAGNOSIS — J449 Chronic obstructive pulmonary disease, unspecified: Secondary | ICD-10-CM

## 2013-02-15 DIAGNOSIS — J45901 Unspecified asthma with (acute) exacerbation: Secondary | ICD-10-CM

## 2013-02-15 MED ORDER — ALBUTEROL SULFATE HFA 108 (90 BASE) MCG/ACT IN AERS: 2.0000 | INHALATION_SPRAY | RESPIRATORY_TRACT | Status: DC | PRN

## 2013-02-15 MED ORDER — EPINEPHRINE 0.3 MG/0.3ML IJ SOAJ: INTRAMUSCULAR | Status: DC

## 2013-02-15 MED ORDER — MOMETASONE FURO-FORMOTEROL FUM 100-5 MCG/ACT IN AERO: INHALATION_SPRAY | RESPIRATORY_TRACT | Status: DC

## 2013-02-15 NOTE — Progress Notes (Signed)
02/10/11- 60 yoM never smoker followed for asthma, allergic rhinitis, complicated by GERD, history of insect venom allergy LOV-02/11/2010 Nurse is refilling meds as reviewed. He gets flu vaccine at work. Has had a very good year from her breathing and allergy standpoint. Just in the last 3 weeks, had sore throat and head cold with mild bronchitis and some transient wheezing. He uses Advair regularly and only very occasionally needs a rescue inhaler mainly before exercise. He does find he needs to continue daily omeprazole. 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 He has reacted locally to honeybee and to yellow jacket.  08/02/11- 60 yoM never smoker followed for asthma, allergic rhinitis, complicated by GERD, history of insect venom allergy ACUTE VISIT: about 6 wks ago took Zpak, then came back and was given Biaxin and Prednisone. Has completed few days ago-developed growth in mouth(went to see dentist-Thrush)took Nystatin(currently on as well). Gotten worse since abx and pred-coughing-deep in chest, dizziness as well., pressure in ears, heaviness in chest. Feels that he is relapsing. No energy. Deep cough with rattling in chest. Brown mucous plugs. No fever, blood or pain.  02/16/12- 61 yoM never smoker followed for asthma, allergic rhinitis, complicated by GERD, history of insect venom allergy FOLLOWS FOR: states no SOB, wheezing, coughing, or congestion more than normal (if having a cold) He reports a couple of minor episodes of chest tightness only. He took prednisone with antibiotic after his legs were cut with a weed eater.  02/15/13-62 yoM never smoker followed for asthma, allergic rhinitis, complicated by GERD, history of insect venom allergy, peripheral venous insufficiency FOLLOWS FOR:has done pretty well since last visit-no illness or bronchitis flares a1AT MS 119 02/16/12- unusual variant but adequate enzyme protection No acute issues.  It helps him to stay on Advair. He did see a vascular surgeon for mild chronic peripheral venous insufficiency with edema.  ROS-see HPI Constitutional:   No-   weight loss, night sweats, fevers, chills, fatigue, lassitude. HEENT:   No-  headaches, difficulty swallowing, tooth/dental problems, sore throat,       No-  sneezing, itching, ear ache, nasal congestion, post nasal drip,  CV:  No-   chest pain, orthopnea, PND, +swelling in lower extremities, anasarca, dizziness, palpitations Resp: No-   shortness of breath with exertion or at rest.              No-   productive cough,  No non-productive cough,  No- coughing up of blood.              No-   change in color of mucus.  No- wheezing.   Skin: No-   rash or lesions. GI:  No-   heartburn, indigestion, abdominal pain, nausea, vomiting,  GU: MS:  . Neuro-     nothing unusual Psych:  No- change in mood or affect. No depression or anxiety.  No memory loss.  OBJ General- Alert, Oriented, Affect-appropriate, Distress- none acute Skin- rash-none, lesions- none, excoriation- none Lymphadenopathy- none Head- atraumatic            Eyes- Gross vision intact, PERRLA, conjunctivae clear secretions            Ears- Hearing, canals-normal            Nose- Clear, no-Septal dev, mucus, polyps, erosion, perforation             Throat- Mallampati II, drainage- none, tonsils- atrophic.  Neck- flexible ,  trachea midline, no stridor , thyroid nl, carotid no bruit Chest - symmetrical excursion , unlabored           Heart/CV- RRR , no murmur , no gallop  , no rub, nl s1 s2                           - JVD- none , edema+/ trace pitting, stasis changes- none, varices- none           Lung- very clear, unlabored , dullness-none, rub- none           Chest wall-  Abd-  Br/ Gen/ Rectal- Not done, not indicated Extrem- cyanosis- none, clubbing, none, atrophy- none, strength- nl. Neg-Homan's Neuro- grossly intact to observation

## 2013-02-15 NOTE — Patient Instructions (Addendum)
Sample and script for Dulera 100     2 puffs then rinse mouth, twice daily   Price compare with Advair 250 and Symbicort 160  Refills Epipen and albuterol HFA rescue inhaler sent

## 2013-02-19 ENCOUNTER — Other Ambulatory Visit: Payer: Self-pay | Admitting: Internal Medicine

## 2013-03-09 ENCOUNTER — Encounter: Payer: Self-pay | Admitting: Internal Medicine

## 2013-03-09 DIAGNOSIS — J449 Chronic obstructive pulmonary disease, unspecified: Secondary | ICD-10-CM | POA: Insufficient documentation

## 2013-03-09 DIAGNOSIS — J45901 Unspecified asthma with (acute) exacerbation: Secondary | ICD-10-CM | POA: Insufficient documentation

## 2013-03-09 NOTE — Assessment & Plan Note (Signed)
Adequate control. We discussed insurance support for Advair and he is going to try a sample Dulera 100 as an alternative

## 2013-04-09 ENCOUNTER — Telehealth: Payer: Self-pay | Admitting: *Deleted

## 2013-04-09 ENCOUNTER — Encounter: Payer: Self-pay | Admitting: Family Medicine

## 2013-04-09 DIAGNOSIS — E785 Hyperlipidemia, unspecified: Secondary | ICD-10-CM

## 2013-04-09 NOTE — Telephone Encounter (Signed)
Patient called to let you know that he is considering donating a kidney to his son. Patient wants to know if he is healthy enough to consider this?  Patient wants to know if he needs any additional test done to determine this?

## 2013-04-10 ENCOUNTER — Encounter: Payer: Self-pay | Admitting: *Deleted

## 2013-04-10 NOTE — Telephone Encounter (Signed)
His kidney function has been normal recently - should be healthy enough to consider donation of kidney. Lab Results  Component Value Date   CREATININE 0.9 11/12/2012  he would not need any tests currently but may need future tests as he gets closer to donation date - and will likely need to discuss with son's kidney doctor.

## 2013-04-10 NOTE — Telephone Encounter (Signed)
Patient notified

## 2013-04-11 ENCOUNTER — Encounter: Payer: Self-pay | Admitting: Internal Medicine

## 2013-06-12 ENCOUNTER — Ambulatory Visit (INDEPENDENT_AMBULATORY_CARE_PROVIDER_SITE_OTHER): Payer: 59 | Admitting: Internal Medicine

## 2013-06-12 ENCOUNTER — Encounter: Payer: Self-pay | Admitting: Internal Medicine

## 2013-06-12 VITALS — BP 118/78 | HR 92 | Temp 98.9°F | Wt 212.5 lb

## 2013-06-12 DIAGNOSIS — B9789 Other viral agents as the cause of diseases classified elsewhere: Secondary | ICD-10-CM

## 2013-06-12 DIAGNOSIS — J988 Other specified respiratory disorders: Secondary | ICD-10-CM

## 2013-06-12 DIAGNOSIS — R05 Cough: Secondary | ICD-10-CM

## 2013-06-12 DIAGNOSIS — R059 Cough, unspecified: Secondary | ICD-10-CM

## 2013-06-12 MED ORDER — HYDROCODONE-HOMATROPINE 5-1.5 MG/5ML PO SYRP: 5.0000 mL | ORAL_SOLUTION | Freq: Three times a day (TID) | ORAL | Status: DC | PRN

## 2013-06-12 NOTE — Progress Notes (Signed)
Pre visit review using our clinic review tool, if applicable. No additional management support is needed unless otherwise documented below in the visit note. 

## 2013-06-12 NOTE — Progress Notes (Signed)
HPI  Pt presents to the clinic today with c/o cough, chest congestion, fever, chills, body aches and headache. He reports this started 4 days ago. The cough is unproductive. He has run fevers up to 103.0. He has been taking tylenol, allergy medicine, mucinex, hycodan. He has not had any relief of his symptoms. He does have a history of allergies and asthma. He was exposed to someone with similar symptoms at work. He did get his flu shot.  Review of Systems      Past Medical History  Diagnosis Date  . Asthma     (Young)  . History of colonic polyps 2008    polypoid mucosa  . GERD (gastroesophageal reflux disease)   . ALLERGIC RHINITIS 02/14/2008    Qualifier: Diagnosis of  By: Maple Hudson MD, Clinton D   . Allergy to insect stings 02/10/2011    Hymenoptera sting     Family History  Problem Relation Age of Onset  . Asthma Mother   . COPD Mother   . Emphysema Father   . Lymphoma Father 69  . Crohn's disease Son     Colonectomy    History   Social History  . Marital Status: Married    Spouse Name: N/A    Number of Children: 1  . Years of Education: N/A   Occupational History  . Art gallery manager for [employer]    Social History Main Topics  . Smoking status: Never Smoker   . Smokeless tobacco: Never Used  . Alcohol Use: Yes     Comment: occasionally beer  . Drug Use: No  . Sexual Activity: Not on file   Other Topics Concern  . Not on file   Social History Narrative   Lives with wife, dog Manufacturing engineer).  Grown children.   Occupation: Technical sales engineer at [employer]    Activity: no regular exercise.  Does walk at work and stays active at home.   Diet: good water, fruits/vegetables daily    Allergies  Allergen Reactions  . Sulfonamide Derivatives Other (See Comments)    Pt states he has stomach pain with these drugs     Constitutional: Positive headache, fatigue and fever. Denies abrupt weight changes.  HEENT:   Denies eye redness, eye pain, pressure behind the eyes, facial  pain, nasal congestion, ear pain, ringing in the ears, wax buildup, runny nose or bloody nose. Respiratory: Positive cough. Denies difficulty breathing or shortness of breath.  Cardiovascular: Denies chest pain, chest tightness, palpitations or swelling in the hands or feet.   No other specific complaints in a complete review of systems (except as listed in HPI above).  Objective:   BP 118/78  Pulse 92  Temp(Src) 98.9 F (37.2 C) (Oral)  Wt 212 lb 8 oz (96.389 kg)  SpO2 98% Wt Readings from Last 3 Encounters:  06/12/13 212 lb 8 oz (96.389 kg)  02/15/13 218 lb (98.884 kg)  11/12/12 210 lb 8 oz (95.482 kg)     General: Appears his stated age, ill appering in NAD. HEENT: Head: normal shape and size; Eyes: sclera white, no icterus, conjunctiva pink, PERRLA and EOMs intact; Ears: Tm's gray and intact, normal light reflex; Nose: mucosa pink and moist, septum midline; Throat/Mouth: + PND. Teeth present, mucosa erythematous and moist, no exudate noted, no lesions or ulcerations noted.  Neck:  Neck supple, trachea midline. No massses, lumps or thyromegaly present.  Cardiovascular: Normal rate and rhythm. S1,S2 noted.  No murmur, rubs or gallops noted. No JVD or BLE edema. No  carotid bruits noted. Pulmonary/Chest: Normal effort and positive vesicular breath sounds. No respiratory distress. No wheezes, rales or ronchi noted.      Assessment & Plan:   Viral Respiratory Infection:  Get some rest and drink plenty of water Continue Tylenol for fever/inflammation eRx for Hycodan cough syrup  RTC as needed or if symptoms persist.

## 2013-06-12 NOTE — Patient Instructions (Addendum)

## 2013-06-21 ENCOUNTER — Encounter: Payer: Self-pay | Admitting: Family Medicine

## 2013-06-21 ENCOUNTER — Ambulatory Visit (INDEPENDENT_AMBULATORY_CARE_PROVIDER_SITE_OTHER): Payer: 59 | Admitting: Family Medicine

## 2013-06-21 VITALS — BP 104/62 | HR 91 | Temp 98.6°F | Ht 70.5 in | Wt 213.5 lb

## 2013-06-21 DIAGNOSIS — B9689 Other specified bacterial agents as the cause of diseases classified elsewhere: Secondary | ICD-10-CM

## 2013-06-21 DIAGNOSIS — J209 Acute bronchitis, unspecified: Secondary | ICD-10-CM

## 2013-06-21 DIAGNOSIS — J208 Acute bronchitis due to other specified organisms: Secondary | ICD-10-CM

## 2013-06-21 DIAGNOSIS — R05 Cough: Secondary | ICD-10-CM

## 2013-06-21 DIAGNOSIS — A499 Bacterial infection, unspecified: Secondary | ICD-10-CM

## 2013-06-21 DIAGNOSIS — B370 Candidal stomatitis: Secondary | ICD-10-CM

## 2013-06-21 DIAGNOSIS — R059 Cough, unspecified: Secondary | ICD-10-CM

## 2013-06-21 MED ORDER — AZITHROMYCIN 250 MG PO TABS: ORAL_TABLET | ORAL | Status: DC

## 2013-06-21 MED ORDER — HYDROCODONE-HOMATROPINE 5-1.5 MG/5ML PO SYRP: 5.0000 mL | ORAL_SOLUTION | Freq: Every evening | ORAL | Status: DC | PRN

## 2013-06-21 MED ORDER — NYSTATIN 100000 UNIT/ML MT SUSP: 5.0000 mL | Freq: Four times a day (QID) | OROMUCOSAL | Status: DC

## 2013-06-21 NOTE — Progress Notes (Signed)
   Subjective:    Patient ID: Terrence Middleton, male    DOB: 16-Aug-1950, 63 y.o.   MRN: [id number]  Seen on 4/8 treated with tylenol, hycodan  for viral infection/ possible flu.   Was having fever at that time continue few more days after that. Cough This is a new problem. The current episode started 1 to 4 weeks ago (2 weeks). The problem has been gradually worsening. The problem occurs constantly. The cough is productive of sputum. Associated symptoms include a fever and headaches. Pertinent negatives include no chest pain, chills, ear pain, hemoptysis, nasal congestion, postnasal drip, shortness of breath or wheezing. Associated symptoms comments: Fatigued  some sinus pressure  no current asthma flare, not using albuterol now.. The symptoms are aggravated by lying down. Risk factors: nonsmoker. Treatments tried: robitussin, guafenesin, using hycodan at bedtime, nasal rinse. The treatment provided mild relief. His past medical history is significant for asthma, bronchitis and environmental allergies. There is no history of bronchiectasis, COPD, emphysema or pneumonia.  Headache  Associated symptoms include coughing and a fever. Pertinent negatives include no ear pain.      Review of Systems  Constitutional: Positive for fever. Negative for chills.       Decreased appetite, foul taste in mouth  HENT: Negative for ear pain and postnasal drip.   Respiratory: Positive for cough. Negative for hemoptysis, shortness of breath and wheezing.   Cardiovascular: Negative for chest pain.  Allergic/Immunologic: Positive for environmental allergies.  Neurological: Positive for headaches.       Objective:   Physical Exam  Constitutional: Vital signs are normal. He appears well-developed and well-nourished.  Non-toxic appearance. He does not appear ill. No distress.  HENT:  Head: Normocephalic and atraumatic.  Right Ear: Hearing, tympanic membrane, external ear and ear canal normal. No tenderness.  No foreign bodies. Tympanic membrane is not retracted and not bulging.  Left Ear: Hearing, tympanic membrane, external ear and ear canal normal. No tenderness. No foreign bodies. Tympanic membrane is not retracted and not bulging.  Nose: Nose normal. No mucosal edema or rhinorrhea. Right sinus exhibits no maxillary sinus tenderness and no frontal sinus tenderness. Left sinus exhibits no maxillary sinus tenderness and no frontal sinus tenderness.  Mouth/Throat: Uvula is midline, oropharynx is clear and moist and mucous membranes are normal. Normal dentition. No dental caries. No oropharyngeal exudate or tonsillar abscesses.  white plaque on tounge in patches.  Eyes: Conjunctivae, EOM and lids are normal. Pupils are equal, round, and reactive to light. Lids are everted and swept, no foreign bodies found.  Neck: Trachea normal, normal range of motion and phonation normal. Neck supple. Carotid bruit is not present. No mass and no thyromegaly present.  Cardiovascular: Normal rate, regular rhythm, S1 normal, S2 normal, normal heart sounds, intact distal pulses and normal pulses.  Exam reveals no gallop.   No murmur heard. Pulmonary/Chest: Effort normal and breath sounds normal. No respiratory distress. He has no wheezes. He has no rhonchi. He has no rales.  Abdominal: Soft. Normal appearance and bowel sounds are normal. There is no hepatosplenomegaly. There is no tenderness. There is no rebound, no guarding and no CVA tenderness. No hernia.  Neurological: He is alert. He has normal reflexes.  Skin: Skin is warm, dry and intact. No rash noted.  Psychiatric: He has a normal mood and affect. His speech is normal and behavior is normal. Judgment normal.          Assessment & Plan:

## 2013-06-21 NOTE — Assessment & Plan Note (Signed)
No clear asthma flare, no indication for prednsione. May stiull be viral but ongoing now for > 2 weeks... Treat with antibiotics.

## 2013-06-21 NOTE — Patient Instructions (Signed)
Nystatin swish and spit for candida infeciton.  Start and complete antibiotics.  Push fluids, rest.  Use hycodan at bedtime and mucinex DM during the day.

## 2013-06-21 NOTE — Assessment & Plan Note (Signed)
Nystatin swish.

## 2013-06-21 NOTE — Progress Notes (Signed)
Pre visit review using our clinic review tool, if applicable. No additional management support is needed unless otherwise documented below in the visit note. 

## 2013-06-24 ENCOUNTER — Other Ambulatory Visit: Payer: Self-pay | Admitting: Family Medicine

## 2013-06-24 ENCOUNTER — Telehealth: Payer: Self-pay | Admitting: Family Medicine

## 2013-06-24 MED ORDER — NYSTATIN 100000 UNIT/ML MT SUSP: 5.0000 mL | Freq: Four times a day (QID) | OROMUCOSAL | Status: DC

## 2013-06-24 NOTE — Telephone Encounter (Signed)
Pt is calling back and is running of medication for thrush and is wanting to know if some more could be called into the pharmacy. Pt would like to speak with a nurse concerning what going on. Please advise.

## 2013-06-24 NOTE — Telephone Encounter (Signed)
Came in 2 weeks ago and saw Terrence Middleton.    Last saw Dr. Ermalene SearingBedsole on Friday and was prescribed ABX.  Dr. Ermalene SearingBedsole noticed he had thrush in his mouth, probably from Advair.  Dr. Ermalene SearingBedsole prescribed a mouthwash that he has used as directed but now it's gone and the thrush is still there.  Patient is asking for a refill on the mouthwash.  Cough is productive of whitish phlegm x 2 weeks which hasn't really gotten better.  Could this cough and sputum production be from the thrush or possibly a bacterial infection?  He says he has a pulmonologist and he can try to get an appt there but it is hard to get in.  Please advise.

## 2013-06-24 NOTE — Telephone Encounter (Signed)
If the cough is persisting then he needs to be rechecked, either here or at pulmonary.   I would continue the nystatin for now, recheck if not better.  rx sent.

## 2013-06-24 NOTE — Telephone Encounter (Signed)
Last office visit 06/21/13. Ok to refill? 

## 2013-06-24 NOTE — Telephone Encounter (Signed)
What is the requested med?

## 2013-06-25 NOTE — Telephone Encounter (Signed)
Disregard

## 2013-06-25 NOTE — Telephone Encounter (Signed)
Patient advised.

## 2013-09-18 ENCOUNTER — Telehealth: Payer: Self-pay | Admitting: Internal Medicine

## 2013-09-18 MED ORDER — MOMETASONE FURO-FORMOTEROL FUM 100-5 MCG/ACT IN AERO: INHALATION_SPRAY | RESPIRATORY_TRACT | Status: DC

## 2013-09-18 NOTE — Telephone Encounter (Signed)
RX has been sent. Nothing further needed 

## 2013-09-26 ENCOUNTER — Other Ambulatory Visit: Payer: Self-pay | Admitting: Internal Medicine

## 2013-09-26 MED ORDER — ALBUTEROL SULFATE HFA 108 (90 BASE) MCG/ACT IN AERS: 2.0000 | INHALATION_SPRAY | RESPIRATORY_TRACT | Status: DC | PRN

## 2013-10-01 ENCOUNTER — Encounter: Payer: Self-pay | Admitting: Internal Medicine

## 2013-11-05 HISTORY — PX: COLONOSCOPY: SHX174

## 2013-11-09 ENCOUNTER — Other Ambulatory Visit: Payer: Self-pay | Admitting: Family Medicine

## 2013-11-09 DIAGNOSIS — E785 Hyperlipidemia, unspecified: Secondary | ICD-10-CM

## 2013-11-09 DIAGNOSIS — Z125 Encounter for screening for malignant neoplasm of prostate: Secondary | ICD-10-CM

## 2013-11-13 ENCOUNTER — Encounter: Payer: 59 | Admitting: Family Medicine

## 2013-11-13 ENCOUNTER — Other Ambulatory Visit (INDEPENDENT_AMBULATORY_CARE_PROVIDER_SITE_OTHER): Payer: 59

## 2013-11-13 DIAGNOSIS — Z125 Encounter for screening for malignant neoplasm of prostate: Secondary | ICD-10-CM

## 2013-11-13 DIAGNOSIS — E785 Hyperlipidemia, unspecified: Secondary | ICD-10-CM

## 2013-11-13 LAB — LIPID PANEL
CHOL/HDL RATIO: 4
Cholesterol: 143 mg/dL (ref 0–200)
HDL: 33.5 mg/dL — AB (ref >39.00)
LDL Cholesterol: 99 mg/dL (ref 0–99)
NonHDL: 109.5
TRIGLYCERIDES: 55 mg/dL (ref 0.0–149.0)
VLDL: 11 mg/dL (ref 0.0–40.0)

## 2013-11-13 LAB — PSA: PSA: 1.52 ng/mL (ref 0.10–4.00)

## 2013-11-13 LAB — BASIC METABOLIC PANEL
BUN: 17 mg/dL (ref 6–23)
CHLORIDE: 104 meq/L (ref 96–112)
CO2: 26 mEq/L (ref 19–32)
Calcium: 8.8 mg/dL (ref 8.4–10.5)
Creatinine, Ser: 1.1 mg/dL (ref 0.4–1.5)
GFR: 73.77 mL/min (ref >60.00)
Glucose, Bld: 99 mg/dL (ref 70–99)
POTASSIUM: 4.3 meq/L (ref 3.5–5.1)
Sodium: 137 mEq/L (ref 135–145)

## 2013-11-18 ENCOUNTER — Ambulatory Visit (AMBULATORY_SURGERY_CENTER): Payer: Self-pay

## 2013-11-18 VITALS — Ht 71.0 in | Wt 213.0 lb

## 2013-11-18 DIAGNOSIS — Z860100 Personal history of colon polyps, unspecified: Secondary | ICD-10-CM

## 2013-11-18 NOTE — Progress Notes (Signed)
No allergies to eggs or soy No past problems with anesthesia No home oxygen No diet/weight loss meds  Has email  Emmi instructions given for colonoscopy 

## 2013-11-20 ENCOUNTER — Encounter: Payer: 59 | Admitting: Family Medicine

## 2013-11-20 ENCOUNTER — Encounter: Payer: Self-pay | Admitting: Family Medicine

## 2013-11-20 ENCOUNTER — Ambulatory Visit (INDEPENDENT_AMBULATORY_CARE_PROVIDER_SITE_OTHER): Payer: 59 | Admitting: Family Medicine

## 2013-11-20 VITALS — BP 128/60 | HR 70 | Temp 98.2°F | Ht 69.5 in | Wt 208.0 lb

## 2013-11-20 DIAGNOSIS — Z23 Encounter for immunization: Secondary | ICD-10-CM | POA: Diagnosis not present

## 2013-11-20 DIAGNOSIS — K219 Gastro-esophageal reflux disease without esophagitis: Secondary | ICD-10-CM | POA: Diagnosis not present

## 2013-11-20 DIAGNOSIS — Z0000 Encounter for general adult medical examination without abnormal findings: Secondary | ICD-10-CM | POA: Diagnosis not present

## 2013-11-20 DIAGNOSIS — E785 Hyperlipidemia, unspecified: Secondary | ICD-10-CM

## 2013-11-20 NOTE — Assessment & Plan Note (Signed)
Chronic, stable on omeprazole. Discussed possible vit deficiencies with prolonged use.

## 2013-11-20 NOTE — Patient Instructions (Signed)
Flu shot today. Check with Dr. Maple Hudson about pneumonia shot. Good to see you today, call us with questions. Return as needed or in 1 year for next physical.

## 2013-11-20 NOTE — Progress Notes (Signed)
BP 128/60  Pulse 70  Temp(Src) 98.2 F (36.8 C) (Oral)  Ht 5' 9.5" (1.765 m)  Wt 208 lb (94.348 kg)  BMI 30.29 kg/m2  SpO2 97%   CC: CPE  Subjective:    Patient ID: Terrence Middleton, male    DOB: December 15, 1950, 63 y.o.   MRN: [id number]  HPI: Terrence Middleton is a 63 y.o. male presenting on 11/20/2013 for Annual Exam   Family stress - son with need for kidney and bilateral lung transplant (hermaski pudlike syndrome? + crohn's). Mother with declining health, recent move to nursing home.  H/o asthma followed by Dr. Maple Hudson. Never smoker. On dulera daily and albuterol prn.  GERD - omeprazole  daily. Discussed multivitamins.   Wt Readings from Last 3 Encounters:  11/20/13 208 lb (94.348 kg)  11/18/13 213 lb (96.616 kg)  06/21/13 213 lb 8 oz (96.843 kg)   Body mass index is 30.29 kg/(m^2).  Preventative: Colon screening - colonoscopy 2008 with non-precancerous polyps Juanda Chance), upcoming rpt colonoscopy.  Prostate screening - discussed screening - pt would like to continue.  Tetanus - 2007.  Flu shot - today.  Pneumovax - will check with pulm Shingles shot - 11/2012 Sunscreen use discussed. Seat belt use discussed  Lives with wife, dog Manufacturing engineer). Grown children.  Occupation: Technical sales engineer at [employer] Activity: no regular exercise.  Diet: good water, fruits/vegetables daily  Relevant past medical, surgical, family and social history reviewed and updated as indicated.  Allergies and medications reviewed and updated. Current Outpatient Prescriptions on File Prior to Visit  Medication Sig  . albuterol (PROAIR HFA) 108 (90 BASE) MCG/ACT inhaler Inhale 2 puffs into the lungs every 4 (four) hours as needed for wheezing or shortness of breath.  . EPINEPHrine (EPI-PEN) 0.3 mg/0.3 mL SOAJ injection Inject into thigh for severe allergic reaction  . mometasone-formoterol (DULERA) 100-5 MCG/ACT AERO 2 puffs then rinse mouth, twice daily  . omeprazole (PRILOSEC) 20 MG  capsule Take 20 mg by mouth daily.    . Sodium Chloride-Sodium Bicarb (AYR SALINE NASAL RINSE) 1.57 G PACK Place into the nose. Uses every morning   No current facility-administered medications on file prior to visit.    Review of Systems  Constitutional: Negative for fever, chills, activity change, appetite change, fatigue and unexpected weight change.  HENT: Negative for hearing loss.   Eyes: Negative for visual disturbance.  Respiratory: Negative for cough, chest tightness, shortness of breath and wheezing.   Cardiovascular: Positive for leg swelling (mild at end of day). Negative for chest pain and palpitations.  Gastrointestinal: Negative for nausea, vomiting, abdominal pain, diarrhea, constipation, blood in stool and abdominal distention.  Genitourinary: Negative for hematuria and difficulty urinating.  Musculoskeletal: Negative for arthralgias, myalgias and neck pain.  Skin: Negative for rash.  Neurological: Negative for dizziness, seizures, syncope and headaches.  Hematological: Negative for adenopathy. Does not bruise/bleed easily.  Psychiatric/Behavioral: Negative for dysphoric mood. The patient is not nervous/anxious.    Per HPI unless specifically indicated above    Objective:    BP 128/60  Pulse 70  Temp(Src) 98.2 F (36.8 C) (Oral)  Ht 5' 9.5" (1.765 m)  Wt 208 lb (94.348 kg)  BMI 30.29 kg/m2  SpO2 97%  Physical Exam  Nursing note and vitals reviewed. Constitutional: He is oriented to person, place, and time. He appears well-developed and well-nourished. No distress.  HENT:  Head: Normocephalic and atraumatic.  Right Ear: Hearing, tympanic membrane, external ear and ear canal normal.  Left  Ear: Hearing, tympanic membrane, external ear and ear canal normal.  Nose: Nose normal.  Mouth/Throat: Uvula is midline, oropharynx is clear and moist and mucous membranes are normal. No oropharyngeal exudate, posterior oropharyngeal edema or posterior oropharyngeal erythema.    Eyes: Conjunctivae and EOM are normal. Pupils are equal, round, and reactive to light. No scleral icterus.  Neck: Normal range of motion. Neck supple. Carotid bruit is not present. No thyromegaly present.  Cardiovascular: Normal rate, regular rhythm, normal heart sounds and intact distal pulses.   No murmur heard. Pulses:      Radial pulses are 2+ on the right side, and 2+ on the left side.  Pulmonary/Chest: Effort normal and breath sounds normal. No respiratory distress. He has no wheezes. He has no rales.  Abdominal: Soft. Bowel sounds are normal. He exhibits no distension and no mass. There is no tenderness. There is no rebound and no guarding.  Genitourinary: Rectum normal and prostate normal. Rectal exam shows no external hemorrhoid, no internal hemorrhoid, no fissure, no mass, no tenderness and anal tone normal. Prostate is not enlarged (10gm) and not tender.  Musculoskeletal: Normal range of motion. He exhibits no edema.  Lymphadenopathy:    He has no cervical adenopathy.  Neurological: He is alert and oriented to person, place, and time.  CN grossly intact, station and gait intact  Skin: Skin is warm and dry. No rash noted.  Psychiatric: He has a normal mood and affect. His behavior is normal. Judgment and thought content normal.   Results for orders placed in visit on 11/13/13  LIPID PANEL      Result Value Ref Range   Cholesterol 143  0 - 200 mg/dL   Triglycerides 52.8  0.0 - 149.0 mg/dL   HDL 41.32 (*) >44.01 mg/dL   VLDL 02.7  0.0 - 25.3 mg/dL   LDL Cholesterol 99  0 - 99 mg/dL   Total CHOL/HDL Ratio 4     NonHDL 109.50    PSA      Result Value Ref Range   PSA 1.52  0.10 - 4.00 ng/mL  BASIC METABOLIC PANEL      Result Value Ref Range   Sodium 137  135 - 145 mEq/L   Potassium 4.3  3.5 - 5.1 mEq/L   Chloride 104  96 - 112 mEq/L   CO2 26  19 - 32 mEq/L   Glucose, Bld 99  70 - 99 mg/dL   BUN 17  6 - 23 mg/dL   Creatinine, Ser 1.1  0.4 - 1.5 mg/dL   Calcium 8.8  8.4 -  66.4 mg/dL   GFR 40.34  >74.25 mL/min      Assessment & Plan:   Problem List Items Addressed This Visit   Healthcare maintenance - Primary     Preventative protocols reviewed and updated unless pt declined. Discussed healthy diet and lifestyle.     GERD     Chronic, stable on omeprazole. Discussed possible vit deficiencies with prolonged use.    Dyslipidemia     Chronic, stable. Off meds. Discussed increased aerobic exercise to improve HDL.        Follow up plan: Return in about 1 year (around 11/21/2014), or as needed, for annual exam, prior fasting for blood work.

## 2013-11-20 NOTE — Progress Notes (Signed)
Pre visit review using our clinic review tool, if applicable. No additional management support is needed unless otherwise documented below in the visit note. 

## 2013-11-20 NOTE — Addendum Note (Signed)
Addended by: Roena Malady on: 11/20/2013 01:18 PM   Modules accepted: Orders

## 2013-11-20 NOTE — Assessment & Plan Note (Signed)
Preventative protocols reviewed and updated unless pt declined. Discussed healthy diet and lifestyle.  

## 2013-11-20 NOTE — Assessment & Plan Note (Signed)
Chronic, stable. Off meds. Discussed increased aerobic exercise to improve HDL.

## 2013-11-25 ENCOUNTER — Encounter: Payer: Self-pay | Admitting: Internal Medicine

## 2013-11-29 ENCOUNTER — Encounter: Payer: Self-pay | Admitting: Internal Medicine

## 2013-11-29 ENCOUNTER — Ambulatory Visit (AMBULATORY_SURGERY_CENTER): Payer: 59 | Admitting: Internal Medicine

## 2013-11-29 VITALS — BP 132/79 | HR 67 | Temp 98.1°F | Resp 14 | Ht 71.0 in | Wt 213.0 lb

## 2013-11-29 DIAGNOSIS — Z86010 Personal history of colonic polyps: Secondary | ICD-10-CM

## 2013-11-29 MED ORDER — SODIUM CHLORIDE 0.9 % IV SOLN: 500.0000 mL | INTRAVENOUS | Status: DC

## 2013-11-29 NOTE — Progress Notes (Signed)
Report to PACU, RN, vss, BBS= Clear.  

## 2013-11-29 NOTE — Patient Instructions (Signed)
YOU HAD AN ENDOSCOPIC PROCEDURE TODAY AT THE [facility]: Refer to the procedure report that was given to you for any specific questions about what was found during the examination.  If the procedure report does not answer your questions, please call your gastroenterologist to clarify.  If you requested that your care partner not be given the details of your procedure findings, then the procedure report has been included in a sealed envelope for you to review at your convenience later.  YOU SHOULD EXPECT: Some feelings of bloating in the abdomen. Passage of more gas than usual.  Walking can help get rid of the air that was put into your GI tract during the procedure and reduce the bloating. If you had a lower endoscopy (such as a colonoscopy or flexible sigmoidoscopy) you may notice spotting of blood in your stool or on the toilet paper. If you underwent a bowel prep for your procedure, then you may not have a normal bowel movement for a few days.  DIET: Your first meal following the procedure should be a light meal and then it is ok to progress to your normal diet.  A half-sandwich or bowl of soup is an example of a good first meal.  Heavy or fried foods are harder to digest and may make you feel nauseous or bloated.  Likewise meals heavy in dairy and vegetables can cause extra gas to form and this can also increase the bloating.  Drink plenty of fluids but you should avoid alcoholic beverages for 24 hours. Try to eat a diet high in fiber.  ACTIVITY: Your care partner should take you home directly after the procedure.  You should plan to take it easy, moving slowly for the rest of the day.  You can resume normal activity the day after the procedure however you should NOT DRIVE or use heavy machinery for 24 hours (because of the sedation medicines used during the test).    SYMPTOMS TO REPORT IMMEDIATELY: A gastroenterologist can be reached at any hour.  During normal business hours, 8:30 AM to  5:00 PM Monday through Friday, call [phone].  After hours and on weekends, please call the GI answering service at [phone] who will take a message and have the physician on call contact you.   Following lower endoscopy (colonoscopy or flexible sigmoidoscopy):  Excessive amounts of blood in the stool  Significant tenderness or worsening of abdominal pains  Swelling of the abdomen that is new, acute  Fever of 100F or higher  FOLLOW UP: If any biopsies were taken you will be contacted by phone or by letter within the next 1-3 weeks.  Call your gastroenterologist if you have not heard about the biopsies in 3 weeks.  Our staff will call the home number listed on your records the next business day following your procedure to check on you and address any questions or concerns that you may have at that time regarding the information given to you following your procedure. This is a courtesy call and so if there is no answer at the home number and we have not heard from you through the emergency physician on call, we will assume that you have returned to your regular daily activities without incident.  SIGNATURES/CONFIDENTIALITY: You and/or your care partner have signed paperwork which will be entered into your electronic medical record.  These signatures attest to the fact that that the information above on your After Visit Summary has been reviewed and is understood.  Full responsibility of the confidentiality of this discharge information lies with you and/or your care-partner.

## 2013-11-29 NOTE — Op Note (Signed)
[facility] [address] [place], [address]   COLONOSCOPY PROCEDURE REPORT  PATIENT: Terrence, Middleton  MR#: [id number] BIRTHDATE: 14-Dec-1950 , 63  yrs. old GENDER: male ENDOSCOPIST: Hart Carwin, MD REFERRED XB:JYNWGN Sharen Hones, M.D. PROCEDURE DATE:  11/29/2013 PROCEDURE:   Colonoscopy, surveillance First Screening Colonoscopy - Avg.  risk and is 50 yrs.  old or older - No.  Prior Negative Screening - Now for repeat screening. N/A  History of Adenoma - Now for follow-up colonoscopy & has been > or = to 3 yrs.  N/A  Polyps Removed Today? No.  Polyps Removed Today? No.  Recommend repeat exam, <10 yrs? Polyps Removed Today? No.  Recommend repeat exam, <10 yrs? No. ASA CLASS:   Class I INDICATIONS:pprior colonoscopy in March 2008 neurofibroma polyp removed. MEDICATIONS: Monitored anesthesia care and Propofol 240 mg IV  DESCRIPTION OF PROCEDURE:   After the risks benefits and alternatives of the procedure were thoroughly explained, informed consent was obtained.  The digital rectal exam revealed no abnormalities of the rectum.   The LB [id number]  endoscope was introduced through the anus and advanced to the cecum, which was identified by both the appendix and ileocecal valve. No adverse events experienced.   The quality of the prep was good, using MoviPrep  The instrument was then slowly withdrawn as the colon was fully examined.      COLON FINDINGS: A normal appearing cecum, ileocecal valve, and appendiceal orifice were identified.  The ascending, transverse, descending, sigmoid colon, and rectum appeared unremarkable. Retroflexed views revealed no abnormalities. The time to cecum=8 minutes 01 seconds.  Withdrawal time=6 minutes 02 seconds.  The scope was withdrawn and the procedure completed. COMPLICATIONS: There were no complications.  ENDOSCOPIC IMPRESSION: Normal colonoscopy  RECOMMENDATIONS: High fiber diet Recall colonoscopy in 10  years  eSigned:  Hart Carwin, MD 11/29/2013 12:35 PM   cc:

## 2013-12-02 ENCOUNTER — Telehealth: Payer: Self-pay | Admitting: *Deleted

## 2013-12-02 NOTE — Telephone Encounter (Signed)
  Follow up Call-  Call back number 11/29/2013  Post procedure Call Back phone  # [phone]  Permission to leave phone message Yes     Patient questions:  Left message to call us if necessary.

## 2013-12-05 ENCOUNTER — Encounter: Payer: Self-pay | Admitting: Family Medicine

## 2014-02-18 ENCOUNTER — Ambulatory Visit: Payer: 59 | Admitting: Internal Medicine

## 2014-02-18 ENCOUNTER — Encounter: Payer: Self-pay | Admitting: Internal Medicine

## 2014-02-18 ENCOUNTER — Ambulatory Visit (INDEPENDENT_AMBULATORY_CARE_PROVIDER_SITE_OTHER): Payer: 59 | Admitting: Internal Medicine

## 2014-02-18 VITALS — BP 114/60 | HR 73 | Ht 70.5 in | Wt 209.6 lb

## 2014-02-18 DIAGNOSIS — J45901 Unspecified asthma with (acute) exacerbation: Secondary | ICD-10-CM

## 2014-02-18 DIAGNOSIS — K219 Gastro-esophageal reflux disease without esophagitis: Secondary | ICD-10-CM

## 2014-02-18 DIAGNOSIS — J069 Acute upper respiratory infection, unspecified: Secondary | ICD-10-CM

## 2014-02-18 MED ORDER — METHYLPREDNISOLONE ACETATE 80 MG/ML IJ SUSP
80.0000 mg | Freq: Once | INTRAMUSCULAR | Status: AC
  Administered 2014-02-18: 80 mg via INTRAMUSCULAR

## 2014-02-18 MED ORDER — ALBUTEROL SULFATE (5 MG/ML) 0.5% IN NEBU: 2.5000 mg | INHALATION_SOLUTION | Freq: Once | RESPIRATORY_TRACT | Status: DC

## 2014-02-18 MED ORDER — LEVALBUTEROL HCL 0.63 MG/3ML IN NEBU
0.6300 mg | INHALATION_SOLUTION | Freq: Once | RESPIRATORY_TRACT | Status: AC
  Administered 2014-02-18: 0.63 mg via RESPIRATORY_TRACT

## 2014-02-18 MED ORDER — OMEPRAZOLE 20 MG PO CPDR: 20.0000 mg | DELAYED_RELEASE_CAPSULE | Freq: Every day | ORAL | Status: DC

## 2014-02-18 MED ORDER — AMOXICILLIN-POT CLAVULANATE 875-125 MG PO TABS: 1.0000 | ORAL_TABLET | Freq: Two times a day (BID) | ORAL | Status: DC

## 2014-02-18 NOTE — Patient Instructions (Signed)
Scripts sent for omeprazole and for augmentin antibiotic  Neb xop 0.63  Depo 80  Suggest you ask your dermatologist about the brown spot on your neck

## 2014-02-18 NOTE — Progress Notes (Signed)
02/10/11- 60 yoM never smoker followed for asthma, allergic rhinitis, complicated by GERD, history of insect venom allergy LOV-02/11/2010 Nurse is refilling meds as reviewed. He gets flu vaccine at work. Has had a very good year from her breathing and allergy standpoint. Just in the last 3 weeks, had sore throat and head cold with mild bronchitis and some transient wheezing. He uses Advair regularly and only very occasionally needs a rescue inhaler mainly before exercise. He does find he needs to continue daily omeprazole. 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 He has reacted locally to honeybee and to yellow jacket.  08/02/11- 60 yoM never smoker followed for asthma, allergic rhinitis, complicated by GERD, history of insect venom allergy ACUTE VISIT: about 6 wks ago took Zpak, then came back and was given Biaxin and Prednisone. Has completed few days ago-developed growth in mouth(went to see dentist-Thrush)took Nystatin(currently on as well). Gotten worse since abx and pred-coughing-deep in chest, dizziness as well., pressure in ears, heaviness in chest. Feels that he is relapsing. No energy. Deep cough with rattling in chest. Brown mucous plugs. No fever, blood or pain.  02/16/12- 61 yoM never smoker followed for asthma, allergic rhinitis, complicated by GERD, history of insect venom allergy FOLLOWS FOR: states no SOB, wheezing, coughing, or congestion more than normal (if having a cold) He reports a couple of minor episodes of chest tightness only. He took prednisone with antibiotic after his legs were cut with a weed eater.  02/15/13-62 yoM never smoker followed for asthma, allergic rhinitis, complicated by GERD, history of insect venom allergy, peripheral venous insufficiency FOLLOWS FOR:has done pretty well since last visit-no illness or bronchitis flares a1AT MS 119 02/16/12- unusual variant but adequate enzyme protection No acute issues.  It helps him to stay on Advair. He did see a vascular surgeon for mild chronic peripheral venous insufficiency with edema.  02/18/14- 62 yoM never smoker followed for asthma, allergic rhinitis, complicated by GERD, history of insect venom allergy, peripheral venous insufficiency FOLLOWS FOR: Since last wednesday having Upper Resp issues; cough-productive-brown in color, wheezing, SOB. Early fever and chills. Has not had any abx or prednisone. Asks to continue omeprazole which works well for controlling reflux symptoms. Likes Dulera inhaler.  ROS-see HPI Constitutional:   No-   weight loss, night sweats, fevers, chills, fatigue, lassitude. HEENT:   No-  headaches, difficulty swallowing, tooth/dental problems, sore throat,       No-  sneezing, itching, ear ache, nasal congestion, post nasal drip,  CV:  No-   chest pain, orthopnea, PND, +swelling in lower extremities, anasarca, dizziness, palpitations Resp: No-   shortness of breath with exertion or at rest.              +  productive cough,  No non-productive cough,  No- coughing up of blood.              +  change in color of mucus.  No- wheezing.   Skin: No-   rash or lesions. GI:  No-   heartburn, indigestion, abdominal pain, nausea, vomiting,  GU: MS:  . Neuro-     nothing unusual Psych:  No- change in mood or affect. No depression or anxiety.  No memory loss.  OBJ General- Alert, Oriented, Affect-appropriate, Distress- none acute Skin- + multicolored brown spot right side of neck. Concerning. Lymphadenopathy- none Head- atraumatic  Eyes- Gross vision intact, PERRLA, conjunctivae clear secretions            Ears- Hearing, canals-normal            Nose- Clear, no-Septal dev, mucus, polyps, erosion, perforation             Throat- Mallampati II, drainage- none, tonsils- atrophic.  Neck- flexible , trachea midline, no stridor , thyroid nl, carotid no bruit Chest - symmetrical excursion , unlabored           Heart/CV- RRR , no  murmur , no gallop  , no rub, nl s1 s2                           - JVD- none , edema+/ trace pitting, stasis changes- none, varices- none           Lung- + wheezy cough, unlabored , dullness-none, rub- none           Chest wall-  Abd-  Br/ Gen/ Rectal- Not done, not indicated Extrem- cyanosis- none, clubbing, none, atrophy- none, strength- nl.  Neuro- grossly intact to observation

## 2014-02-18 NOTE — Assessment & Plan Note (Signed)
Generally good control as long as he stays on omeprazole. I agreed to refill

## 2014-02-18 NOTE — Assessment & Plan Note (Signed)
Recently moderate persistent symptoms consistent with an acute bronchitis Plan-Augmentin, nebulizer Xopenex, Depo-Medrol

## 2014-07-07 ENCOUNTER — Encounter: Payer: Self-pay | Admitting: Primary Care

## 2014-07-07 ENCOUNTER — Ambulatory Visit (INDEPENDENT_AMBULATORY_CARE_PROVIDER_SITE_OTHER): Payer: 59 | Admitting: Primary Care

## 2014-07-07 VITALS — BP 118/82 | HR 63 | Temp 98.1°F | Ht 70.5 in | Wt 213.0 lb

## 2014-07-07 DIAGNOSIS — J209 Acute bronchitis, unspecified: Secondary | ICD-10-CM

## 2014-07-07 MED ORDER — AMOXICILLIN-POT CLAVULANATE 875-125 MG PO TABS: 1.0000 | ORAL_TABLET | Freq: Two times a day (BID) | ORAL | Status: DC

## 2014-07-07 MED ORDER — BENZONATATE 200 MG PO CAPS: 200.0000 mg | ORAL_CAPSULE | Freq: Three times a day (TID) | ORAL | Status: DC | PRN

## 2014-07-07 NOTE — Patient Instructions (Signed)
Start Augmentin tablets. Take one tablet by mouth twice daily for 10 days. Start Tessalon Pearls. Take one capsule by mouth three times daily as needed for cough. You may take 5 ml of the Hycodan cough syrup at bedtime for cough. Call me if no improvement in the next 3-4 days. It was a pleasure meeting you, take care!  Acute Bronchitis Bronchitis is inflammation of the airways that extend from the windpipe into the lungs (bronchi). The inflammation often causes mucus to develop. This leads to a cough, which is the most common symptom of bronchitis.  In acute bronchitis, the condition usually develops suddenly and goes away over time, usually in a couple weeks. Smoking, allergies, and asthma can make bronchitis worse. Repeated episodes of bronchitis may cause further lung problems.  CAUSES Acute bronchitis is most often caused by the same virus that causes a cold. The virus can spread from person to person (contagious) through coughing, sneezing, and touching contaminated objects. SIGNS AND SYMPTOMS   Cough.   Fever.   Coughing up mucus.   Body aches.   Chest congestion.   Chills.   Shortness of breath.   Sore throat.  DIAGNOSIS  Acute bronchitis is usually diagnosed through a physical exam. Your health care provider will also ask you questions about your medical history. Tests, such as chest X-rays, are sometimes done to rule out other conditions.  TREATMENT  Acute bronchitis usually goes away in a couple weeks. Oftentimes, no medical treatment is necessary. Medicines are sometimes given for relief of fever or cough. Antibiotic medicines are usually not needed but may be prescribed in certain situations. In some cases, an inhaler may be recommended to help reduce shortness of breath and control the cough. A cool mist vaporizer may also be used to help thin bronchial secretions and make it easier to clear the chest.  HOME CARE INSTRUCTIONS  Get plenty of rest.   Drink  enough fluids to keep your urine clear or pale yellow (unless you have a medical condition that requires fluid restriction). Increasing fluids may help thin your respiratory secretions (sputum) and reduce chest congestion, and it will prevent dehydration.   Take medicines only as directed by your health care provider.  If you were prescribed an antibiotic medicine, finish it all even if you start to feel better.  Avoid smoking and secondhand smoke. Exposure to cigarette smoke or irritating chemicals will make bronchitis worse. If you are a smoker, consider using nicotine gum or skin patches to help control withdrawal symptoms. Quitting smoking will help your lungs heal faster.   Reduce the chances of another bout of acute bronchitis by washing your hands frequently, avoiding people with cold symptoms, and trying not to touch your hands to your mouth, nose, or eyes.   Keep all follow-up visits as directed by your health care provider.  SEEK MEDICAL CARE IF: Your symptoms do not improve after 1 week of treatment.  SEEK IMMEDIATE MEDICAL CARE IF:  You develop an increased fever or chills.   You have chest pain.   You have severe shortness of breath.  You have bloody sputum.   You develop dehydration.  You faint or repeatedly feel like you are going to pass out.  You develop repeated vomiting.  You develop a severe headache. MAKE SURE YOU:   Understand these instructions.  Will watch your condition.  Will get help right away if you are not doing well or get worse. Document Released: 03/31/2004 Document Revised: 07/08/2013 Document  Reviewed: 08/14/2012 ExitCare Patient Information 2015 [place], [place]. This information is not intended to replace advice given to you by your health care provider. Make sure you discuss any questions you have with your health care provider.

## 2014-07-07 NOTE — Progress Notes (Signed)
Pre visit review using our clinic review tool, if applicable. No additional management support is needed unless otherwise documented below in the visit note. 

## 2014-07-07 NOTE — Progress Notes (Signed)
Subjective:    Patient ID: Terrence Middleton, male    DOB: [birth year], 64 y.o.   MRN: [id number]  HPI  Mr. Jarosz is a 64 year old non-smoking male who presents today with a chief complaint of cough. His cough is productive with green sputum and began 6 weeks ago. He was in the hospital caring for his son, [place] of state, consistently for the first 3 weeks of his symptoms. He takes [facility] for exercise induced asthma and does not feel as it is uncontrolled. He also takes omeprazole daily for which he believes to be controlling his GERD symptoms. His cough is worst at night as he is unable to rest efficiently. Denies fevers, chills, nausea.  He reports finding 2 very small ticks attached near his umbilicus one week ago. Denies rash.  Review of Systems  Constitutional: Positive for fatigue. Negative for fever and chills.  HENT: Positive for congestion, rhinorrhea and sinus pressure. Negative for ear pain and sore throat.   Respiratory: Positive for cough. Negative for shortness of breath.   Cardiovascular: Negative for chest pain.  Gastrointestinal: Negative for nausea and vomiting.  Skin: Negative for rash.  Psychiatric/Behavioral: Positive for sleep disturbance.       Past Medical History  Diagnosis Date  . Asthma     exercise induced per patient Maple Hudson)  . GERD (gastroesophageal reflux disease)   . ALLERGIC RHINITIS 02/14/2008    Young MD, Joni Fears D   . Allergy to insect stings 02/10/2011    Hymenoptera sting     History   Social History  . Marital Status: Married    Spouse Name: N/A  . Number of Children: 1  . Years of Education: N/A   Occupational History  . Art gallery manager for [employer]    Social History Main Topics  . Smoking status: Never Smoker   . Smokeless tobacco: Never Used  . Alcohol Use: 3.0 - 3.6 oz/week    5-6 Cans of beer per week     Comment: occasionally beer  . Drug Use: No  . Sexual Activity: Not on file   Other Topics Concern  . Not on file    Social History Narrative   Lives with wife, dog Manufacturing engineer).  Grown children.   Occupation: Technical sales engineer at [employer]   Activity: no regular exercise.  Does walk at work and stays active at home.   Diet: good water, fruits/vegetables daily    Past Surgical History  Procedure Laterality Date  . Cerivcal fracture  2008    fractured c3 after fall  . Colonoscopy  05/2006    colon polyps Juanda Chance)  . Colonoscopy  11/2013    WNL Juanda Chance)    Family History  Problem Relation Age of Onset  . Asthma Mother   . COPD Mother   . Emphysema Father   . Lymphoma Father 75  . Crohn's disease Son     Colonectomy  . Colon cancer Neg Hx     Allergies  Allergen Reactions  . Sulfonamide Derivatives Other (See Comments)    Pt states he has stomach pain with these drugs    Current Outpatient Prescriptions on File Prior to Visit  Medication Sig Dispense Refill  . EPINEPHrine (EPI-PEN) 0.3 mg/0.3 mL SOAJ injection Inject into thigh for severe allergic reaction 1 Device prn  . mometasone-formoterol (DULERA) 100-5 MCG/ACT AERO 2 puffs then rinse mouth, twice daily 3 Inhaler prn  . omeprazole (PRILOSEC) 20 MG capsule Take 1 capsule (20  mg total) by mouth daily. 30 capsule 11  . Sodium Chloride-Sodium Bicarb (AYR SALINE NASAL RINSE) 1.57 G PACK Place into the nose. Uses every morning    . albuterol (PROAIR HFA) 108 (90 BASE) MCG/ACT inhaler Inhale 2 puffs into the lungs every 4 (four) hours as needed for wheezing or shortness of breath. (Patient not taking: Reported on 07/07/2014) 8.5 g PRN   No current facility-administered medications on file prior to visit.    BP 118/82 mmHg  Pulse 63  Temp(Src) 98.1 F (36.7 C) (Oral)  Ht 5' 10.5" (1.791 m)  Wt 213 lb (96.616 kg)  BMI 30.12 kg/m2  SpO2 98%    Objective:   Physical Exam  Constitutional: He is oriented to person, place, and time. He appears ill.  HENT:  Right Ear: Tympanic membrane and ear canal normal.  Left Ear:  Tympanic membrane and ear canal normal.  Nose: Nose normal.  Mouth/Throat: Oropharynx is clear and moist.  Eyes: Conjunctivae are normal. Pupils are equal, round, and reactive to light.  Neck: Neck supple.  Cardiovascular: Normal rate and regular rhythm.   Pulmonary/Chest: Effort normal. He has no decreased breath sounds. He has no wheezes. He has rhonchi.  Lymphadenopathy:    He has no cervical adenopathy.  Neurological: He is alert and oriented to person, place, and time.  Skin: Skin is warm and dry. No rash noted.          Assessment & Plan:  Acute bronchitis:  Productive cough for 6 weeks. Doesn't sound like asthma or GERD related. No decreased sounds noted in his lung fields, do no suspect pneumonia. RX for Augmentin for 10 days due to duration and ill appearance. Tessalon pearls TID for day time cough. He has some Hycodan cough syrup at home which he will start taking at night. Follow up if no improvement in 3-4 days.

## 2014-07-31 ENCOUNTER — Ambulatory Visit (INDEPENDENT_AMBULATORY_CARE_PROVIDER_SITE_OTHER): Payer: 59 | Admitting: Family Medicine

## 2014-07-31 ENCOUNTER — Encounter: Payer: Self-pay | Admitting: Family Medicine

## 2014-07-31 VITALS — BP 112/68 | HR 86 | Temp 98.1°F | Ht 70.5 in | Wt 209.0 lb

## 2014-07-31 DIAGNOSIS — J020 Streptococcal pharyngitis: Secondary | ICD-10-CM | POA: Diagnosis not present

## 2014-07-31 DIAGNOSIS — B370 Candidal stomatitis: Secondary | ICD-10-CM

## 2014-07-31 LAB — POCT RAPID STREP A (OFFICE): Rapid Strep A Screen: POSITIVE — AB

## 2014-07-31 MED ORDER — AMOXICILLIN 875 MG PO TABS: 875.0000 mg | ORAL_TABLET | Freq: Two times a day (BID) | ORAL | Status: DC

## 2014-07-31 MED ORDER — NYSTATIN 100000 UNIT/ML MT SUSP: 5.0000 mL | Freq: Four times a day (QID) | OROMUCOSAL | Status: DC

## 2014-07-31 NOTE — Assessment & Plan Note (Signed)
dulera use, recent abx use.  Treat with nystatin swish/swallow.

## 2014-07-31 NOTE — Assessment & Plan Note (Addendum)
RST positive Treat with amoxicillin 10d course. Update if persistent sxs.

## 2014-07-31 NOTE — Patient Instructions (Addendum)
I'm sorry about your son. You have acute strep pharyngitis and thrush.. Treat with amoxicillin 10 day course and nystatin swish/swallow Push fluids and plenty of rest. May use ibuprofen for throat inflammation. Salt water gargles. Suck on cold things like popsicles or warm things like herbal teas (whichever soothes the throat better). Return if fever >101.5, worsening pain, or trouble opening/closing mouth, or hoarse voice. Good to see you today, call clinic with questions.

## 2014-07-31 NOTE — Addendum Note (Signed)
Addended by: Barbaraann FasterIPPO-PURGASON, Schawn Byas M on: 07/31/2014 05:45 PM   Modules accepted: Orders

## 2014-07-31 NOTE — Progress Notes (Signed)
Pre visit review using our clinic review tool, if applicable. No additional management support is needed unless otherwise documented below in the visit note. 

## 2014-07-31 NOTE — Progress Notes (Signed)
BP 112/68 mmHg  Pulse 86  Temp(Src) 98.1 F (36.7 C) (Oral)  Ht 5' 10.5" (1.791 m)  Wt 209 lb (94.802 kg)  BMI 29.55 kg/m2  SpO2 95%   CC: fever, ST, cough  Subjective:    Patient ID: Terrence Middleton, male    DOB: 09-17-1950, 64 y.o.   MRN: [id number]  HPI: Amos Micheals is a 64 y.o. male presenting on 07/31/2014 for Cough; Fever; and Sore Throat   Seen 5/2 with dx acute bronchitis, treated with augmentin 10d course and tessalon perls. This resolved chest sxs.   Now presents with 1-2 d h/o ST with 1d h/o white coating on tongue. Increased irritated cough over last 3d. He started taking left over nystatin swish/swallow. Chills last night along with diaphoresis. + HA last night.  No ear or tooth pain, fevers, nausea, new rashes.   + sick contacts at home.   Known asthma on albuterol and dulera, GERD on omeprazole, allergic rhinitis. Just started claritin.   33yo son recently passed away in [place].  Increased stress, stayed with him for 30 d in ICU. Pt was taking claritin and guaifenesin throughout all of this.   Relevant past medical, surgical, family and social history reviewed and updated as indicated. Interim medical history since our last visit reviewed. Allergies and medications reviewed and updated. Current Outpatient Prescriptions on File Prior to Visit  Medication Sig  . albuterol (PROAIR HFA) 108 (90 BASE) MCG/ACT inhaler Inhale 2 puffs into the lungs every 4 (four) hours as needed for wheezing or shortness of breath.  . benzonatate (TESSALON) 200 MG capsule Take 1 capsule (200 mg total) by mouth 3 (three) times daily as needed for cough.  . EPINEPHrine (EPI-PEN) 0.3 mg/0.3 mL SOAJ injection Inject into thigh for severe allergic reaction  . mometasone-formoterol (DULERA) 100-5 MCG/ACT AERO 2 puffs then rinse mouth, twice daily  . omeprazole (PRILOSEC) 20 MG capsule Take 1 capsule (20 mg total) by mouth daily.  . Sodium Chloride-Sodium Bicarb (AYR SALINE NASAL  RINSE) 1.57 G PACK Place into the nose. Uses every morning   No current facility-administered medications on file prior to visit.    Review of Systems Per HPI unless specifically indicated above     Objective:    BP 112/68 mmHg  Pulse 86  Temp(Src) 98.1 F (36.7 C) (Oral)  Ht 5' 10.5" (1.791 m)  Wt 209 lb (94.802 kg)  BMI 29.55 kg/m2  SpO2 95%  Wt Readings from Last 3 Encounters:  07/31/14 209 lb (94.802 kg)  07/07/14 213 lb (96.616 kg)  02/18/14 209 lb 9.6 oz (95.074 kg)    Physical Exam  Constitutional: He appears well-developed and well-nourished. No distress.  HENT:  Head: Normocephalic and atraumatic.  Right Ear: Hearing, tympanic membrane, external ear and ear canal normal.  Left Ear: Hearing, tympanic membrane, external ear and ear canal normal.  Nose: Mucosal edema (nasal congestion) present. No rhinorrhea. Right sinus exhibits no maxillary sinus tenderness and no frontal sinus tenderness. Left sinus exhibits no maxillary sinus tenderness and no frontal sinus tenderness.  Mouth/Throat: Uvula is midline, oropharynx is clear and moist and mucous membranes are normal. No oropharyngeal exudate, posterior oropharyngeal edema, posterior oropharyngeal erythema or tonsillar abscesses.  White film on tongue  Raw tonsillar pillars  Eyes: Conjunctivae and EOM are normal. Pupils are equal, round, and reactive to light. No scleral icterus.  Neck: Normal range of motion. Neck supple.  Cardiovascular: Normal rate, regular rhythm, normal heart sounds and intact  distal pulses.   No murmur heard. Pulmonary/Chest: Effort normal and breath sounds normal. No respiratory distress. He has no wheezes. He has no rales.  Lymphadenopathy:    He has no cervical adenopathy.  Skin: Skin is warm and dry. No rash noted.  Nursing note and vitals reviewed.      Assessment & Plan:  Offered my condolences re son.   Problem List Items Addressed This Visit    Acute streptococcal pharyngitis -  Primary    RST positive Treat with amoxicillin 10d course. Update if persistent sxs.      Relevant Medications   nystatin (MYCOSTATIN) 100000 UNIT/ML suspension   Oral thrush    dulera use, recent abx use.  Treat with nystatin swish/swallow.      Relevant Medications   nystatin (MYCOSTATIN) 100000 UNIT/ML suspension       Follow up plan: Return if symptoms worsen or fail to improve.

## 2014-09-25 ENCOUNTER — Ambulatory Visit (INDEPENDENT_AMBULATORY_CARE_PROVIDER_SITE_OTHER)
Admission: RE | Admit: 2014-09-25 | Discharge: 2014-09-25 | Disposition: A | Discharge status: Home / Self Care | Payer: BC Managed Care – PPO | Source: Ambulatory Visit | Attending: Family Medicine | Admitting: Family Medicine

## 2014-09-25 ENCOUNTER — Encounter: Payer: Self-pay | Admitting: Family Medicine

## 2014-09-25 ENCOUNTER — Ambulatory Visit (INDEPENDENT_AMBULATORY_CARE_PROVIDER_SITE_OTHER): Payer: BC Managed Care – PPO | Admitting: Family Medicine

## 2014-09-25 ENCOUNTER — Ambulatory Visit
Admission: RE | Admit: 2014-09-25 | Discharge: 2014-09-25 | Disposition: A | Discharge status: Home / Self Care | Payer: BC Managed Care – PPO | Source: Ambulatory Visit | Attending: Family Medicine | Admitting: Family Medicine

## 2014-09-25 VITALS — BP 114/76 | HR 67 | Temp 97.4°F | Ht 70.5 in | Wt 210.8 lb

## 2014-09-25 DIAGNOSIS — S82841A Displaced bimalleolar fracture of right lower leg, initial encounter for closed fracture: Secondary | ICD-10-CM

## 2014-09-25 DIAGNOSIS — M25571 Pain in right ankle and joints of right foot: Secondary | ICD-10-CM

## 2014-09-25 NOTE — Progress Notes (Signed)
Pre visit review using our clinic review tool, if applicable. No additional management support is needed unless otherwise documented below in the visit note. 

## 2014-09-25 NOTE — Patient Instructions (Addendum)
Referral to [facility] today.. Stop at front desk to set up.  Ice . Elevation, no weight bearing.

## 2014-09-25 NOTE — Progress Notes (Signed)
   Subjective:    Patient ID: Terrence Middleton, male    DOB: 22-Jul-1950, 64 y.o.   MRN: [id number]  HPI  64 year old male presents with new onset ankle injury 1 week ago. He reports that 1 week ago her turned his right ankle while playing with his grandchild bare foot  Forced ankle inversion.  Heard a loud pop. Sudden pain in right 2nd digit and rgiht lateral ankle. Resulting swelling. Pain in upper lateral ankle and around malleolus. Gradually improving but not much.  Awoke him at night.  Has been doing rest, ice 2-3 hours a day, elevation. Wearing lace up ankle brace. Using crutches.  Using ibuprofen   HX of fracture in right ankle, no surgery, no pins.   Social History /Family History/Past Medical History reviewed and updated if needed.   Review of Systems  Constitutional: Negative for fever and fatigue.  HENT: Negative for ear pain.   Eyes: Negative for pain.  Respiratory: Negative for cough.   Cardiovascular: Negative for chest pain.       Objective:   Physical Exam  Constitutional: Vital signs are normal. He appears well-developed and well-nourished.  HENT:  Head: Normocephalic.  Right Ear: Hearing normal.  Left Ear: Hearing normal.  Nose: Nose normal.  Mouth/Throat: Oropharynx is clear and moist and mucous membranes are normal.  Neck: Trachea normal. Carotid bruit is not present. No thyroid mass and no thyromegaly present.  Cardiovascular: Normal rate, regular rhythm and normal pulses.  Exam reveals no gallop, no distant heart sounds and no friction rub.   No murmur heard. No peripheral edema  Pulmonary/Chest: Effort normal and breath sounds normal. No respiratory distress.  Musculoskeletal:       Right ankle: He exhibits decreased range of motion, swelling, ecchymosis and deformity. Tenderness. Proximal fibula tenderness found. No lateral malleolus, no medial malleolus and no head of 5th metatarsal tenderness found. Achilles tendon normal.       Feet:  ttp  over leg proximal  Ankle  positive squeeze test.  Skin: Skin is warm, dry and intact. No rash noted.  Psychiatric: He has a normal mood and affect. His speech is normal and behavior is normal. Thought content normal.          Assessment & Plan:  Distal fibular fracture, nondisplaced, closed.  Refer to [facility] for recommendations. In meantime, no weight bearing, elevation, ice and ibuprofen for pain.

## 2014-10-01 ENCOUNTER — Other Ambulatory Visit: Payer: Self-pay | Admitting: Internal Medicine

## 2014-10-13 ENCOUNTER — Encounter: Payer: Self-pay | Admitting: Primary Care

## 2014-10-13 ENCOUNTER — Ambulatory Visit (INDEPENDENT_AMBULATORY_CARE_PROVIDER_SITE_OTHER): Payer: BC Managed Care – PPO | Admitting: Primary Care

## 2014-10-13 ENCOUNTER — Ambulatory Visit (INDEPENDENT_AMBULATORY_CARE_PROVIDER_SITE_OTHER)
Admission: RE | Admit: 2014-10-13 | Discharge: 2014-10-13 | Disposition: A | Discharge status: Home / Self Care | Payer: BC Managed Care – PPO | Source: Ambulatory Visit | Attending: Primary Care | Admitting: Primary Care

## 2014-10-13 VITALS — BP 116/72 | HR 75 | Temp 98.1°F | Ht 70.5 in | Wt 210.8 lb

## 2014-10-13 DIAGNOSIS — R05 Cough: Secondary | ICD-10-CM

## 2014-10-13 DIAGNOSIS — R059 Cough, unspecified: Secondary | ICD-10-CM

## 2014-10-13 MED ORDER — HYDROCODONE-HOMATROPINE 5-1.5 MG/5ML PO SYRP: 5.0000 mL | ORAL_SOLUTION | Freq: Every evening | ORAL | Status: DC | PRN

## 2014-10-13 MED ORDER — PREDNISONE 20 MG PO TABS: ORAL_TABLET | ORAL | Status: DC

## 2014-10-13 NOTE — Progress Notes (Signed)
Pre visit review using our clinic review tool, if applicable. No additional management support is needed unless otherwise documented below in the visit note. 

## 2014-10-13 NOTE — Patient Instructions (Addendum)
Start prednisone tablets for wheezing and shortness of breath. Take 3 tablets for 4 days, then 2 tablets for 4 days, then 1 tablet for 4 days.  You may take the Hycodan at bedtime to help with cough and rest. You may take the Robitussin DM during the day for cough.  Continue Claritin. Do not take ibuprofen with prednisone. You may take up to 3000 mg of tylenol daily for pain.  Complete xray(s) prior to leaving today. I will contact you regarding your results.  It was a pleasure to see you today!  Acute Bronchitis Bronchitis is inflammation of the airways that extend from the windpipe into the lungs (bronchi). The inflammation often causes mucus to develop. This leads to a cough, which is the most common symptom of bronchitis.  In acute bronchitis, the condition usually develops suddenly and goes away over time, usually in a couple weeks. Smoking, allergies, and asthma can make bronchitis worse. Repeated episodes of bronchitis may cause further lung problems.  CAUSES Acute bronchitis is most often caused by the same virus that causes a cold. The virus can spread from person to person (contagious) through coughing, sneezing, and touching contaminated objects. SIGNS AND SYMPTOMS   Cough.   Fever.   Coughing up mucus.   Body aches.   Chest congestion.   Chills.   Shortness of breath.   Sore throat.  DIAGNOSIS  Acute bronchitis is usually diagnosed through a physical exam. Your health care provider will also ask you questions about your medical history. Tests, such as chest X-rays, are sometimes done to rule out other conditions.  TREATMENT  Acute bronchitis usually goes away in a couple weeks. Oftentimes, no medical treatment is necessary. Medicines are sometimes given for relief of fever or cough. Antibiotic medicines are usually not needed but may be prescribed in certain situations. In some cases, an inhaler may be recommended to help reduce shortness of breath and control  the cough. A cool mist vaporizer may also be used to help thin bronchial secretions and make it easier to clear the chest.  HOME CARE INSTRUCTIONS  Get plenty of rest.   Drink enough fluids to keep your urine clear or pale yellow (unless you have a medical condition that requires fluid restriction). Increasing fluids may help thin your respiratory secretions (sputum) and reduce chest congestion, and it will prevent dehydration.   Take medicines only as directed by your health care provider.  If you were prescribed an antibiotic medicine, finish it all even if you start to feel better.  Avoid smoking and secondhand smoke. Exposure to cigarette smoke or irritating chemicals will make bronchitis worse. If you are a smoker, consider using nicotine gum or skin patches to help control withdrawal symptoms. Quitting smoking will help your lungs heal faster.   Reduce the chances of another bout of acute bronchitis by washing your hands frequently, avoiding people with cold symptoms, and trying not to touch your hands to your mouth, nose, or eyes.   Keep all follow-up visits as directed by your health care provider.  SEEK MEDICAL CARE IF: Your symptoms do not improve after 1 week of treatment.  SEEK IMMEDIATE MEDICAL CARE IF:  You develop an increased fever or chills.   You have chest pain.   You have severe shortness of breath.  You have bloody sputum.   You develop dehydration.  You faint or repeatedly feel like you are going to pass out.  You develop repeated vomiting.  You develop a severe  headache. MAKE SURE YOU:   Understand these instructions.  Will watch your condition.  Will get help right away if you are not doing well or get worse. Document Released: 03/31/2004 Document Revised: 07/08/2013 Document Reviewed: 08/14/2012 [facility] Patient Information 2015 [place], [place]. This information is not intended to replace advice given to you by your health care provider.  Make sure you discuss any questions you have with your health care provider.

## 2014-10-13 NOTE — Progress Notes (Signed)
Subjective:    Patient ID: Terrence Middleton, male    DOB: [birth year], 64 y.o.   MRN: [id number]  HPI  Terrence Middleton is a 64 year old male who presents today with a chief complaint of cough. He has a history of bronchitis, has had 2 cases since December 2015, and was placed on antibiotics both times. His symptoms include fever, cough (brownish sputum), shortness of breath for the past 4 days. Overall starting to feel better, his fevers have dissipated, and has not been coughing as much. He reports recently being around his younger grandchildren who were diagnosed with a viral URI. He's been taking ibuprofen and tylenol for right lower extremity pain from recent fracture. He's also been taking claritin daily for the past 3 days, along with his albuterol inhaler, and robitussin DM.  Review of Systems  Constitutional: Positive for fever.  HENT: Negative for congestion, sinus pressure and sore throat.   Respiratory: Positive for cough, shortness of breath and wheezing.   Cardiovascular: Negative for chest pain.  Gastrointestinal: Negative for nausea.  Musculoskeletal: Negative for myalgias.       Past Medical History  Diagnosis Date  . Asthma     exercise induced per patient Terrence Middleton)  . GERD (gastroesophageal reflux disease)   . ALLERGIC RHINITIS 02/14/2008    Young MD, Terrence Middleton   . Allergy to insect stings 02/10/2011    Hymenoptera sting     History   Social History  . Marital Status: Married    Spouse Name: N/A  . Number of Children: 1  . Years of Education: N/A   Occupational History  . Art gallery manager for [employer]    Social History Main Topics  . Smoking status: Never Smoker   . Smokeless tobacco: Never Used  . Alcohol Use: 3.0 - 3.6 oz/week    5-6 Cans of beer per week     Comment: occasionally beer  . Drug Use: No  . Sexual Activity: Not on file   Other Topics Concern  . Not on file   Social History Narrative   Lives with wife, dog Manufacturing engineer).  Grown children.   33yo  Terrence Middleton son passed away 17-Jul-2014 (pulm fibrosis - Terrence Middleton syndrome)   Occupation: Technical sales engineer at [employer]   Activity: no regular exercise.  Does walk at work and stays active at home.   Diet: good water, fruits/vegetables daily    Past Surgical History  Procedure Laterality Date  . Cerivcal fracture  07/17/06    fractured c3 after fall  . Colonoscopy  05/2006    colon polyps Terrence Middleton)  . Colonoscopy  11/2013    WNL Terrence Middleton)    Family History  Problem Relation Age of Onset  . Asthma Mother   . COPD Mother   . Emphysema Father   . Lymphoma Father 74  . Crohn's disease Son     Colonectomy  . Colon cancer Neg Hx   . Pulmonary disease Son 61    passed away    Allergies  Allergen Reactions  . Sulfonamide Derivatives Other (See Comments)    Pt states he has stomach pain with these drugs    Current Outpatient Prescriptions on File Prior to Visit  Medication Sig Dispense Refill  . DULERA 100-5 MCG/ACT AERO TAKE 2 PUFFS THEN RINSE MOUTH TWICE DAILY 13 g 3  . EPINEPHrine (EPI-PEN) 0.3 mg/0.3 mL SOAJ injection Inject into thigh for severe allergic reaction 1 Device prn  . omeprazole (PRILOSEC) 20  MG capsule Take 1 capsule (20 mg total) by mouth daily. 30 capsule 11  . PROAIR HFA 108 (90 BASE) MCG/ACT inhaler INHALE 2 PUFFS INTO THE LUNGS EVERY 4 (FOUR) HOURS AS NEEDED FOR WHEEZING OR SHORTNESS OF BREATH. 8.5 Inhaler 3  . Sodium Chloride-Sodium Bicarb (AYR SALINE NASAL RINSE) 1.57 G PACK Place into the nose. Uses every morning    . albuterol (PROAIR HFA) 108 (90 BASE) MCG/ACT inhaler Inhale 2 puffs into the lungs every 4 (four) hours as needed for wheezing or shortness of breath. 8.5 g PRN   No current facility-administered medications on file prior to visit.    BP 116/72 mmHg  Pulse 75  Temp(Src) 98.1 F (36.7 C) (Oral)  Ht 5' 10.5" (1.791 m)  Wt 210 lb 12.8 oz (95.618 kg)  BMI 29.81 kg/m2  SpO2 97%    Objective:   Physical Exam  Constitutional: He  appears well-nourished. He does not appear ill.  Cardiovascular: Normal rate and regular rhythm.   Pulmonary/Chest: No respiratory distress. He has no wheezes. He has rhonchi in the right upper field, the right lower field, the left upper field and the left lower field.  Dry cough present on exam  Skin: Skin is warm and dry.          Assessment & Plan:  Bronchitis:  Suspect viral at this point. Third episode since 12/15. Chest xray negative for pneumonia. Mild hyperinflation. Rales throughout. Vitals stable today. Overall improvement in symptoms per patient. Treat with supportive measures as this has been present for 4 days. Prednisone taper, robitussin during the day, Hycodan at night. Continue albuterol PRN.  He is to call with an update this Thursday. If no improvement the will consider antibiotics.

## 2014-10-17 ENCOUNTER — Telehealth: Payer: Self-pay | Admitting: Primary Care

## 2014-10-17 DIAGNOSIS — R059 Cough, unspecified: Secondary | ICD-10-CM

## 2014-10-17 DIAGNOSIS — R05 Cough: Secondary | ICD-10-CM

## 2014-10-17 MED ORDER — DOXYCYCLINE HYCLATE 100 MG PO TABS: 100.0000 mg | ORAL_TABLET | Freq: Two times a day (BID) | ORAL | Status: DC

## 2014-10-17 NOTE — Telephone Encounter (Signed)
I left a message for the patient that Rx sent to pharmacy.

## 2014-10-17 NOTE — Telephone Encounter (Signed)
Patient was seen on Monday.  Patient was told to call back, if he didn't feel better.  Patient said he's coughing up brown all day.  It was a little better at the middle of the week, but it's worse now.  Patient would like an antibiotic called in to [facility].

## 2014-10-17 NOTE — Telephone Encounter (Signed)
Will call in Doxycycline antibiotics. Take 1 tablet by mouth twice daily for 10 days. Johny Drilling, will you notify patient?

## 2014-10-20 NOTE — Telephone Encounter (Signed)
Called and checked on patient. He just started the Rx on Sunday night. Will update if not better.

## 2014-11-16 ENCOUNTER — Other Ambulatory Visit: Payer: Self-pay | Admitting: Family Medicine

## 2014-11-16 DIAGNOSIS — E785 Hyperlipidemia, unspecified: Secondary | ICD-10-CM

## 2014-11-16 DIAGNOSIS — Z125 Encounter for screening for malignant neoplasm of prostate: Secondary | ICD-10-CM

## 2014-11-17 ENCOUNTER — Other Ambulatory Visit (INDEPENDENT_AMBULATORY_CARE_PROVIDER_SITE_OTHER): Payer: BC Managed Care – PPO

## 2014-11-17 DIAGNOSIS — Z125 Encounter for screening for malignant neoplasm of prostate: Secondary | ICD-10-CM

## 2014-11-17 DIAGNOSIS — E785 Hyperlipidemia, unspecified: Secondary | ICD-10-CM

## 2014-11-17 LAB — BASIC METABOLIC PANEL
BUN: 17 mg/dL (ref 6–23)
CHLORIDE: 106 meq/L (ref 96–112)
CO2: 28 mEq/L (ref 19–32)
Calcium: 8.8 mg/dL (ref 8.4–10.5)
Creatinine, Ser: 0.93 mg/dL (ref 0.40–1.50)
GFR: 87.37 mL/min (ref >60.00)
GLUCOSE: 95 mg/dL (ref 70–99)
Potassium: 4.5 mEq/L (ref 3.5–5.1)
Sodium: 141 mEq/L (ref 135–145)

## 2014-11-17 LAB — LIPID PANEL
CHOL/HDL RATIO: 4
Cholesterol: 141 mg/dL (ref 0–200)
HDL: 38.9 mg/dL — ABNORMAL LOW (ref >39.00)
LDL Cholesterol: 90 mg/dL (ref 0–99)
NonHDL: 102.14
TRIGLYCERIDES: 61 mg/dL (ref 0.0–149.0)
VLDL: 12.2 mg/dL (ref 0.0–40.0)

## 2014-11-17 LAB — PSA: PSA: 1.26 ng/mL (ref 0.10–4.00)

## 2014-11-24 ENCOUNTER — Encounter: Payer: Self-pay | Admitting: Family Medicine

## 2014-11-24 ENCOUNTER — Ambulatory Visit (INDEPENDENT_AMBULATORY_CARE_PROVIDER_SITE_OTHER): Payer: BC Managed Care – PPO | Admitting: Family Medicine

## 2014-11-24 VITALS — BP 110/74 | HR 72 | Temp 98.1°F | Ht 69.5 in | Wt 210.0 lb

## 2014-11-24 DIAGNOSIS — E785 Hyperlipidemia, unspecified: Secondary | ICD-10-CM

## 2014-11-24 DIAGNOSIS — K219 Gastro-esophageal reflux disease without esophagitis: Secondary | ICD-10-CM

## 2014-11-24 DIAGNOSIS — J449 Chronic obstructive pulmonary disease, unspecified: Secondary | ICD-10-CM

## 2014-11-24 DIAGNOSIS — Z0000 Encounter for general adult medical examination without abnormal findings: Secondary | ICD-10-CM | POA: Diagnosis not present

## 2014-11-24 DIAGNOSIS — Z23 Encounter for immunization: Secondary | ICD-10-CM | POA: Diagnosis not present

## 2014-11-24 LAB — IBC PANEL
IRON: 58 ug/dL (ref 42–165)
SATURATION RATIOS: 15.8 % — AB (ref 20.0–50.0)
TRANSFERRIN: 262 mg/dL (ref 212.0–360.0)

## 2014-11-24 LAB — MAGNESIUM: Magnesium: 2.1 mg/dL (ref 1.5–2.5)

## 2014-11-24 LAB — VITAMIN B12: Vitamin B-12: 301 pg/mL (ref 211–911)

## 2014-11-24 NOTE — Progress Notes (Signed)
BP 110/74 mmHg  Pulse 72  Temp(Src) 98.1 F (36.7 C) (Oral)  Ht 5' 9.5" (1.765 m)  Wt 210 lb (95.255 kg)  BMI 30.58 kg/m2   CC: CPE  Subjective:    Patient ID: Terrence Middleton, male    DOB: 1950-03-21, 64 y.o.   MRN: [id number]  HPI: Terrence Middleton is a 64 y.o. male presenting on 11/24/2014 for Annual Exam   Broke fibula this year. Wore cast. Followed by [place] [facility].  Exercise induced asthma/allergic rhinitis - on dulera and proair prescribed by Dr Maple Hudson. May consider transitioning Rx to our office, and Dr Maple Hudson would agree. Recent bronchitis x2 this year - necessitating proair nightly, as well as thrush and strep throat. This was in setting of increased family stress - recently son passed away in [place] (pulm fibrosis).  Undergoing counseling through hospice with wife.   Intermittent gi distress. Prior treated successfully by GI with Bentyl.   Just lost job.   Preventative: COLONOSCOPY Date: 11/2013 WNL Juanda Chance) Prostate screening - discussed screening - pt would like to continue.  Tetanus - Jul 05, 2005.  Flu shot - today Pneumovax - will check with pulm - thinks he's had one in the past. Shingles shot - 11/2012 Sunscreen use discussed. Seat belt use discussed  Lives with wife, dog Manufacturing engineer). Grown children. 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 Does walk at work and stays active at home. Diet: good water, fruits/vegetables daily  Relevant past medical, surgical, family and social history reviewed and updated as indicated. Interim medical history since our last visit reviewed. Allergies and medications reviewed and updated. Current Outpatient Prescriptions on File Prior to Visit  Medication Sig  . DULERA 100-5 MCG/ACT AERO TAKE 2 PUFFS THEN RINSE MOUTH TWICE DAILY  . omeprazole (PRILOSEC) 20 MG capsule Take 1 capsule (20 mg total) by mouth  daily.  Marland Kitchen PROAIR HFA 108 (90 BASE) MCG/ACT inhaler INHALE 2 PUFFS INTO THE LUNGS EVERY 4 (FOUR) HOURS AS NEEDED FOR WHEEZING OR SHORTNESS OF BREATH.  Marland Kitchen Sodium Chloride-Sodium Bicarb (AYR SALINE NASAL RINSE) 1.57 G PACK Place into the nose. Uses every morning  . EPINEPHrine (EPI-PEN) 0.3 mg/0.3 mL SOAJ injection Inject into thigh for severe allergic reaction (Patient not taking: Reported on 11/24/2014)   No current facility-administered medications on file prior to visit.    Review of Systems  Constitutional: Negative for fever, chills, activity change, appetite change, fatigue and unexpected weight change.  HENT: Negative for hearing loss.   Eyes: Negative for visual disturbance.  Respiratory: Positive for cough (if off PPI). Negative for chest tightness, shortness of breath and wheezing.   Cardiovascular: Negative for chest pain, palpitations and leg swelling.  Gastrointestinal: Negative for nausea, vomiting, abdominal pain, diarrhea, constipation, blood in stool and abdominal distention.       Reflux when off PPI  Genitourinary: Negative for hematuria and difficulty urinating.  Musculoskeletal: Negative for myalgias, arthralgias and neck pain.  Skin: Negative for rash.  Neurological: Negative for dizziness, seizures, syncope and headaches.  Hematological: Negative for adenopathy. Does not bruise/bleed easily.  Psychiatric/Behavioral: Negative for dysphoric mood. The patient is not nervous/anxious.    Per HPI unless specifically indicated above    Objective:    BP 110/74 mmHg  Pulse 72  Temp(Src) 98.1 F (36.7 C) (Oral)  Ht 5' 9.5" (1.765 m)  Wt 210 lb (95.255 kg)  BMI 30.58 kg/m2  Wt Readings from Last 3 Encounters:  11/24/14 210 lb (95.255 kg)  10/13/14 210 lb 12.8 oz (95.618 kg)  09/25/14 210 lb 12 oz (95.596 kg)    Physical Exam  Constitutional: He is oriented to person, place, and time. He appears well-developed and well-nourished. No distress.  HENT:  Head:  Normocephalic and atraumatic.  Right Ear: Hearing, tympanic membrane, external ear and ear canal normal.  Left Ear: Hearing, tympanic membrane, external ear and ear canal normal.  Nose: Nose normal.  Mouth/Throat: Uvula is midline, oropharynx is clear and moist and mucous membranes are normal. No oropharyngeal exudate, posterior oropharyngeal edema or posterior oropharyngeal erythema.  Eyes: Conjunctivae and EOM are normal. Pupils are equal, round, and reactive to light. No scleral icterus.  Neck: Normal range of motion. Neck supple. No thyromegaly present.  Cardiovascular: Normal rate, regular rhythm, normal heart sounds and intact distal pulses.   No murmur heard. Pulses:      Radial pulses are 2+ on the right side, and 2+ on the left side.  Pulmonary/Chest: Effort normal and breath sounds normal. No respiratory distress. He has no wheezes. He has no rales.  Abdominal: Soft. Bowel sounds are normal. He exhibits no distension and no mass. There is no tenderness. There is no rebound and no guarding.  Genitourinary: Rectum normal and prostate normal. Rectal exam shows no external hemorrhoid, no internal hemorrhoid, no fissure, no mass, no tenderness and anal tone normal. Prostate is not enlarged and not tender.  Musculoskeletal: Normal range of motion. He exhibits no edema.  Lymphadenopathy:    He has no cervical adenopathy.  Neurological: He is alert and oriented to person, place, and time.  CN grossly intact, station and gait intact  Skin: Skin is warm and dry. No rash noted.  Psychiatric: He has a normal mood and affect. His behavior is normal. Judgment and thought content normal.  Nursing note and vitals reviewed.  Results for orders placed or performed in visit on 11/17/14  Lipid panel  Result Value Ref Range   Cholesterol 141 0 - 200 mg/dL   Triglycerides 16.1 0.0 - 149.0 mg/dL   HDL 09.60 (L) >45.40 mg/dL   VLDL 98.1 0.0 - 19.1 mg/dL   LDL Cholesterol 90 0 - 99 mg/dL   Total  CHOL/HDL Ratio 4    NonHDL 102.14   PSA  Result Value Ref Range   PSA 1.26 0.10 - 4.00 ng/mL  Basic metabolic panel  Result Value Ref Range   Sodium 141 135 - 145 mEq/L   Potassium 4.5 3.5 - 5.1 mEq/L   Chloride 106 96 - 112 mEq/L   CO2 28 19 - 32 mEq/L   Glucose, Bld 95 70 - 99 mg/dL   BUN 17 6 - 23 mg/dL   Creatinine, Ser 4.78 0.40 - 1.50 mg/dL   Calcium 8.8 8.4 - 29.5 mg/dL   GFR 62.13 >08.65 mL/min      Assessment & Plan:   Problem List Items Addressed This Visit    GERD    Discussed dietary changes to control GERD. Continue omeprazole  OTC daily. Discussed long term effects.      Relevant Orders   Vitamin B12   Magnesium   IBC panel   Healthcare maintenance - Primary    Preventative protocols reviewed and updated unless pt declined. Discussed healthy diet and lifestyle.       Dyslipidemia    Minimal off meds.      Asthmatic bronchitis , chronic  Has had bronchitis treatment x3 this past year. Does correlate to increased family stress taking care of son who eventually passed away earlier this year. Will f/u with Dr Maple Hudson this Fall.       Other Visit Diagnoses    Need for influenza vaccination        Relevant Orders    Flu Vaccine QUAD 36+ mos PF IM (Fluarix & Fluzone Quad PF)        Follow up plan: Return in about 1 year (around 11/24/2015), or as needed, for follow up visit.

## 2014-11-24 NOTE — Assessment & Plan Note (Signed)
Has had bronchitis treatment x3 this past year. Does correlate to increased family stress taking care of son who eventually passed away earlier this year. Will f/u with Dr Maple Hudson this Fall.

## 2014-11-24 NOTE — Patient Instructions (Addendum)
Flu shot today. Lab visit today. Return in 1 year for next physical.   For GERD symptoms -  Head of bed elevated.  Avoidance of citrus, fatty foods, chocolate, peppermint, and excessive alcohol, along with sodas, orange juice (acidic drinks).  At least a few hours between dinner and bed, minimize naps after eating.   Health Maintenance A healthy lifestyle and preventative care can promote health and wellness.  Maintain regular health, dental, and eye exams.  Eat a healthy diet. Foods like vegetables, fruits, whole grains, low-fat dairy products, and lean protein foods contain the nutrients you need and are low in calories. Decrease your intake of foods high in solid fats, added sugars, and salt. Get information about a proper diet from your health care provider, if necessary.  Regular physical exercise is one of the most important things you can do for your health. Most adults should get at least 150 minutes of moderate-intensity exercise (any activity that increases your heart rate and causes you to sweat) each week. In addition, most adults need muscle-strengthening exercises on 2 or more days a week.   Maintain a healthy weight. The body mass index (BMI) is a screening tool to identify possible weight problems. It provides an estimate of body fat based on height and weight. Your health care provider can find your BMI and can help you achieve or maintain a healthy weight. For males 20 years and older:  A BMI below 18.5 is considered underweight.  A BMI of 18.5 to 24.9 is normal.  A BMI of 25 to 29.9 is considered overweight.  A BMI of 30 and above is considered obese.  Maintain normal blood lipids and cholesterol by exercising and minimizing your intake of saturated fat. Eat a balanced diet with plenty of fruits and vegetables. Blood tests for lipids and cholesterol should begin at age 80 and be repeated every 5 years. If your lipid or cholesterol levels are high, you are over age 69,  or you are at high risk for heart disease, you may need your cholesterol levels checked more frequently.Ongoing high lipid and cholesterol levels should be treated with medicines if diet and exercise are not working.  If you smoke, find out from your health care provider how to quit. If you do not use tobacco, do not start.  Lung cancer screening is recommended for adults aged 55-80 years who are at high risk for developing lung cancer because of a history of smoking. A yearly low-dose CT scan of the lungs is recommended for people who have at least a 30-pack-year history of smoking and are current smokers or have quit within the past 15 years. A pack year of smoking is smoking an average of 1 pack of cigarettes a day for 1 year (for example, a 30-pack-year history of smoking could mean smoking 1 pack a day for 30 years or 2 packs a day for 15 years). Yearly screening should continue until the smoker has stopped smoking for at least 15 years. Yearly screening should be stopped for people who develop a health problem that would prevent them from having lung cancer treatment.  If you choose to drink alcohol, do not have more than 2 drinks per day. One drink is considered to be 12 oz (360 mL) of beer, 5 oz (150 mL) of wine, or 1.5 oz (45 mL) of liquor.  Avoid the use of street drugs. Do not share needles with anyone. Ask for help if you need support or instructions about  stopping the use of drugs.  High blood pressure causes heart disease and increases the risk of stroke. Blood pressure should be checked at least every 1-2 years. Ongoing high blood pressure should be treated with medicines if weight loss and exercise are not effective.  If you are 34-4 years old, ask your health care provider if you should take aspirin to prevent heart disease.  Diabetes screening involves taking a blood sample to check your fasting blood sugar level. This should be done once every 3 years after age 49 if you are at a  normal weight and without risk factors for diabetes. Testing should be considered at a younger age or be carried out more frequently if you are overweight and have at least 1 risk factor for diabetes.  Colorectal cancer can be detected and often prevented. Most routine colorectal cancer screening begins at the age of 47 and continues through age 25. However, your health care provider may recommend screening at an earlier age if you have risk factors for colon cancer. On a yearly basis, your health care provider may provide home test kits to check for hidden blood in the stool. A small camera at the end of a tube may be used to directly examine the colon (sigmoidoscopy or colonoscopy) to detect the earliest forms of colorectal cancer. Talk to your health care provider about this at age 51 when routine screening begins. A direct exam of the colon should be repeated every 5-10 years through age 65, unless early forms of precancerous polyps or small growths are found.  People who are at an increased risk for hepatitis B should be screened for this virus. You are considered at high risk for hepatitis B if:  You were born in a country where hepatitis B occurs often. Talk with your health care provider about which countries are considered high risk.  Your parents were born in a high-risk country and you have not received a shot to protect against hepatitis B (hepatitis B vaccine).  You have HIV or AIDS.  You use needles to inject street drugs.  You live with, or have sex with, someone who has hepatitis B.  You are a man who has sex with other men (MSM).  You get hemodialysis treatment.  You take certain medicines for conditions like cancer, organ transplantation, and autoimmune conditions.  Hepatitis C blood testing is recommended for all people born from [birth year] through 1963 and any individual with known risk factors for hepatitis C.  Healthy men should no longer receive prostate-specific antigen  (PSA) blood tests as part of routine cancer screening. Talk to your health care provider about prostate cancer screening.  Testicular cancer screening is not recommended for adolescents or adult males who have no symptoms. Screening includes self-exam, a health care provider exam, and other screening tests. Consult with your health care provider about any symptoms you have or any concerns you have about testicular cancer.  Practice safe sex. Use condoms and avoid high-risk sexual practices to reduce the spread of sexually transmitted infections (STIs).  You should be screened for STIs, including gonorrhea and chlamydia if:  You are sexually active and are younger than 24 years.  You are older than 24 years, and your health care provider tells you that you are at risk for this type of infection.  Your sexual activity has changed since you were last screened, and you are at an increased risk for chlamydia or gonorrhea. Ask your health care provider if you are  at risk.  If you are at risk of being infected with HIV, it is recommended that you take a prescription medicine daily to prevent HIV infection. This is called pre-exposure prophylaxis (PrEP). You are considered at risk if:  You are a man who has sex with other men (MSM).  You are a heterosexual man who is sexually active with multiple partners.  You take drugs by injection.  You are sexually active with a partner who has HIV.  Talk with your health care provider about whether you are at high risk of being infected with HIV. If you choose to begin PrEP, you should first be tested for HIV. You should then be tested every 3 months for as long as you are taking PrEP.  Use sunscreen. Apply sunscreen liberally and repeatedly throughout the day. You should seek shade when your shadow is shorter than you. Protect yourself by wearing long sleeves, pants, a wide-brimmed hat, and sunglasses year round whenever you are outdoors.  Tell your health  care provider of new moles or changes in moles, especially if there is a change in shape or color. Also, tell your health care provider if a mole is larger than the size of a pencil eraser.  A one-time screening for abdominal aortic aneurysm (AAA) and surgical repair of large AAAs by ultrasound is recommended for men aged 65-75 years who are current or former smokers.  Stay current with your vaccines (immunizations). Document Released: 08/20/2007 Document Revised: 02/26/2013 Document Reviewed: 07/19/2010 [facility] Patient Information 2015 [place], [place]. This information is not intended to replace advice given to you by your health care provider. Make sure you discuss any questions you have with your health care provider.

## 2014-11-24 NOTE — Assessment & Plan Note (Signed)
Minimal off meds. 

## 2014-11-24 NOTE — Assessment & Plan Note (Signed)
Discussed dietary changes to control GERD. Continue omeprazole  OTC daily. Discussed long term effects.

## 2014-11-24 NOTE — Assessment & Plan Note (Signed)
Preventative protocols reviewed and updated unless pt declined. Discussed healthy diet and lifestyle.  

## 2014-11-24 NOTE — Addendum Note (Signed)
Addended by: Liane Comber C on: 11/24/2014 01:57 PM   Modules accepted: Kipp Brood

## 2014-11-24 NOTE — Progress Notes (Signed)
Pre visit review using our clinic review tool, if applicable. No additional management support is needed unless otherwise documented below in the visit note. 

## 2014-11-25 ENCOUNTER — Encounter: Payer: Self-pay | Admitting: Family Medicine

## 2015-01-16 ENCOUNTER — Other Ambulatory Visit: Payer: Self-pay | Admitting: Internal Medicine

## 2015-01-16 MED ORDER — EPINEPHRINE 0.3 MG/0.3ML IJ SOAJ: INTRAMUSCULAR | Status: DC

## 2015-01-16 NOTE — Telephone Encounter (Signed)
Received paper refill request to refill pt's epipen Refill sent electronically  Nothing further is needed

## 2015-01-26 ENCOUNTER — Encounter: Payer: Self-pay | Admitting: Family Medicine

## 2015-01-26 ENCOUNTER — Ambulatory Visit: Payer: BC Managed Care – PPO | Admitting: Internal Medicine

## 2015-01-26 ENCOUNTER — Ambulatory Visit (INDEPENDENT_AMBULATORY_CARE_PROVIDER_SITE_OTHER): Payer: BC Managed Care – PPO | Admitting: Family Medicine

## 2015-01-26 VITALS — BP 124/78 | HR 80 | Temp 98.1°F | Wt 213.8 lb

## 2015-01-26 DIAGNOSIS — R430 Anosmia: Secondary | ICD-10-CM | POA: Insufficient documentation

## 2015-01-26 DIAGNOSIS — M549 Dorsalgia, unspecified: Secondary | ICD-10-CM | POA: Insufficient documentation

## 2015-01-26 DIAGNOSIS — J209 Acute bronchitis, unspecified: Secondary | ICD-10-CM | POA: Diagnosis not present

## 2015-01-26 DIAGNOSIS — M79604 Pain in right leg: Secondary | ICD-10-CM

## 2015-01-26 DIAGNOSIS — M545 Low back pain: Secondary | ICD-10-CM | POA: Insufficient documentation

## 2015-01-26 MED ORDER — HYDROCODONE-HOMATROPINE 5-1.5 MG/5ML PO SYRP: 5.0000 mL | ORAL_SOLUTION | Freq: Two times a day (BID) | ORAL | Status: DC | PRN

## 2015-01-26 MED ORDER — AZITHROMYCIN 250 MG PO TABS: ORAL_TABLET | ORAL | Status: DC

## 2015-01-26 NOTE — Progress Notes (Signed)
BP 124/78 mmHg  Pulse 80  Temp(Src) 98.1 F (36.7 C) (Oral)  Wt 213 lb 12 oz (96.956 kg)  SpO2 96%   CC: cough  Subjective:    Patient ID: Terrence Middleton, male    DOB: 06/24/1950, 64 y.o.   MRN: [id number]  HPI: Terrence DraftGary Ray Gilbo is a 64 y.o. male presenting on 01/26/2015 for Cough   Lost son earlier in the year.   sxs started 17d ago. Started with sore throat, drainage. Settling into chest. Yellow/green sputum, sinus drainage.   Self treats with guaifenesin with cough suppressant or decongestant and antihistamine. Has also been using hycodan cough syrup which has helped sleep night through.  + sick contact (grandchild) at home.  No smokers at home.  Known exercise induced asthma on albuterol and dulera, GERD on omeprazole, allergic rhinitis. Just started claritin.  Prior bronchitis - 07/2014, 8/2/016. Strep throat 07/2014 as well.   Previously prescribed bentyl (dicyclomine) 20mg  by Dr Juanda ChanceBrodie for ?IBS. Finds this causes blurred vision and slowed thinking. Has started taking this more regularly. COLONOSCOPY Date: 11/2013 WNL Juanda Chance(Brodie)  Noticing sharp lower back pain R>L ongoing over last several months. Denies inciting trauma or falls. He did have R fibular fracture with boot use which may have tweaked back. No urinary issues.  Has lost sense of smell over the past year.  Relevant past medical, surgical, family and social history reviewed and updated as indicated. Interim medical history since our last visit reviewed. Allergies and medications reviewed and updated. Current Outpatient Prescriptions on File Prior to Visit  Medication Sig  . DULERA 100-5 MCG/ACT AERO TAKE 2 PUFFS THEN RINSE MOUTH TWICE DAILY  . omeprazole (PRILOSEC) 20 MG capsule Take 1 capsule (20 mg total) by mouth daily.  Marland Kitchen. PROAIR HFA 108 (90 BASE) MCG/ACT inhaler INHALE 2 PUFFS INTO THE LUNGS EVERY 4 (FOUR) HOURS AS NEEDED FOR WHEEZING OR SHORTNESS OF BREATH.  Marland Kitchen. Sodium Chloride-Sodium Bicarb (AYR SALINE  NASAL RINSE) 1.57 G PACK Place into the nose. Uses every morning  . EPINEPHrine 0.3 mg/0.3 mL IJ SOAJ injection Inject into thigh for severe allergic reaction (Patient not taking: Reported on 01/26/2015)   No current facility-administered medications on file prior to visit.    Review of Systems Per HPI unless specifically indicated in ROS section     Objective:    BP 124/78 mmHg  Pulse 80  Temp(Src) 98.1 F (36.7 C) (Oral)  Wt 213 lb 12 oz (96.956 kg)  SpO2 96%  Wt Readings from Last 3 Encounters:  01/26/15 213 lb 12 oz (96.956 kg)  11/24/14 210 lb (95.255 kg)  10/13/14 210 lb 12.8 oz (95.618 kg)    Physical Exam  Constitutional: He appears well-developed and well-nourished. No distress.  HENT:  Head: Normocephalic and atraumatic.  Right Ear: Hearing, tympanic membrane, external ear and ear canal normal.  Left Ear: Hearing, tympanic membrane, external ear and ear canal normal.  Nose: Mucosal edema and rhinorrhea present. Right sinus exhibits no maxillary sinus tenderness and no frontal sinus tenderness. Left sinus exhibits no maxillary sinus tenderness and no frontal sinus tenderness.  Mouth/Throat: Uvula is midline, oropharynx is clear and moist and mucous membranes are normal. No oropharyngeal exudate, posterior oropharyngeal edema, posterior oropharyngeal erythema or tonsillar abscesses.  Mild septal deviation to left R sided nasal mucosal inflammation/irritation.  Eyes: Conjunctivae and EOM are normal. Pupils are equal, round, and reactive to light. No scleral icterus.  Neck: Normal range of motion. Neck supple.  Cardiovascular: Normal  rate, regular rhythm, normal heart sounds and intact distal pulses.   No murmur heard. Pulmonary/Chest: Effort normal and breath sounds normal. No respiratory distress. He has no wheezes. He has no rales.  Lungs clear, cough present  Musculoskeletal: He exhibits no edema.  No pain midline spine No paraspinous mm tenderness Neg SLR  bilaterally. No pain with int/ext rotation at hip. Neg FABER. No pain at SIJ, GTB or sciatic notch bilaterally.   Lymphadenopathy:    He has no cervical adenopathy.  Skin: Skin is warm and dry. No rash noted.  Nursing note and vitals reviewed.     Assessment & Plan:   Problem List Items Addressed This Visit    Pain of back and right lower extremity    Exam benign today. No red flags. Trial ibuprofen, update if persistent pain. rec return for eval when in acute flare of back pain. Not consistent with intestinal cause.      Anosmia    Consider ENT referral.      Acute bronchitis - Primary    Without evidence of asthma exacerbation today.  Treat with zpack, hycodan, ibuprofen. Further supportive care discussed. Update if not improving with treatment.           Follow up plan: Return if symptoms worsen or fail to improve.

## 2015-01-26 NOTE — Assessment & Plan Note (Signed)
Without evidence of asthma exacerbation today.  Treat with zpack, hycodan, ibuprofen. Further supportive care discussed. Update if not improving with treatment.

## 2015-01-26 NOTE — Progress Notes (Signed)
Pre visit review using our clinic review tool, if applicable. No additional management support is needed unless otherwise documented below in the visit note. 

## 2015-01-26 NOTE — Assessment & Plan Note (Signed)
Exam benign today. No red flags. Trial ibuprofen, update if persistent pain. rec return for eval when in acute flare of back pain. Not consistent with intestinal cause.

## 2015-01-26 NOTE — Patient Instructions (Addendum)
I think you do have bronchitis. Treat with zpack and hycodan cough syrup.  May use ibuprofen 400-600mg  with meals as needed for inflammation.  This might also help the right lower back.  Update us if not improving in 1-2 weeks after treatment.  Ok to continue using other medications as up to now.  Return when pain recurs.

## 2015-01-26 NOTE — Assessment & Plan Note (Signed)
Consider ENT referral

## 2015-02-19 ENCOUNTER — Ambulatory Visit (INDEPENDENT_AMBULATORY_CARE_PROVIDER_SITE_OTHER): Payer: BC Managed Care – PPO | Admitting: Internal Medicine

## 2015-02-19 ENCOUNTER — Encounter: Payer: Self-pay | Admitting: Internal Medicine

## 2015-02-19 VITALS — BP 118/78 | HR 81 | Ht 69.5 in | Wt 214.0 lb

## 2015-02-19 DIAGNOSIS — J45909 Unspecified asthma, uncomplicated: Secondary | ICD-10-CM

## 2015-02-19 DIAGNOSIS — K219 Gastro-esophageal reflux disease without esophagitis: Secondary | ICD-10-CM | POA: Diagnosis not present

## 2015-02-19 DIAGNOSIS — Z91038 Other insect allergy status: Secondary | ICD-10-CM | POA: Diagnosis not present

## 2015-02-19 DIAGNOSIS — Z23 Encounter for immunization: Secondary | ICD-10-CM

## 2015-02-19 DIAGNOSIS — J449 Chronic obstructive pulmonary disease, unspecified: Secondary | ICD-10-CM | POA: Diagnosis not present

## 2015-02-19 MED ORDER — DOXYCYCLINE HYCLATE 100 MG PO TABS: ORAL_TABLET | ORAL | Status: DC

## 2015-02-19 NOTE — Patient Instructions (Signed)
Prescription for doxycycline refillable set to take at own judgment  Consider elevating head of bed about 2 inches by putting block under head legs to reduce possibility of reflux while lying down  Pneumovax  Try using sample Anooro Ellipta instead of Dulera  inhale 1 puff then rinse mouth once daily maintenance  Consider trying Pepcid 10- 20 mg once daily before a meal as your long term acid blocker

## 2015-02-19 NOTE — Progress Notes (Signed)
02/10/11- 60 yoM never smoker followed for asthma, allergic rhinitis, complicated by GERD, history of insect venom allergy LOV-02/11/2010 Nurse is refilling meds as reviewed. He gets flu vaccine at work. Has had a very good year from her breathing and allergy standpoint. Just in the last 3 weeks, had sore throat and head cold with mild bronchitis and some transient wheezing. He uses Advair regularly and only very occasionally needs a rescue inhaler mainly before exercise. He does find he needs to continue daily omeprazole. 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 He has reacted locally to honeybee and to yellow jacket.  08/02/11- 60 yoM never smoker followed for asthma, allergic rhinitis, complicated by GERD, history of insect venom allergy ACUTE VISIT: about 6 wks ago took Zpak, then came back and was given Biaxin and Prednisone. Has completed few days ago-developed growth in mouth(went to see dentist-Thrush)took Nystatin(currently on as well). Gotten worse since abx and pred-coughing-deep in chest, dizziness as well., pressure in ears, heaviness in chest. Feels that he is relapsing. No energy. Deep cough with rattling in chest. Brown mucous plugs. No fever, blood or pain.  02/16/12- 61 yoM never smoker followed for asthma, allergic rhinitis, complicated by GERD, history of insect venom allergy FOLLOWS FOR: states no SOB, wheezing, coughing, or congestion more than normal (if having a cold) He reports a couple of minor episodes of chest tightness only. He took prednisone with antibiotic after his legs were cut with a weed eater.  02/15/13-62 yoM never smoker followed for asthma, allergic rhinitis, complicated by GERD, history of insect venom allergy, peripheral venous insufficiency FOLLOWS FOR:has done pretty well since last visit-no illness or bronchitis flares a1AT MS 119 02/16/12- unusual variant but adequate enzyme protection No acute issues.  It helps him to stay on Advair. He did see a vascular surgeon for mild chronic peripheral venous insufficiency with edema.  02/18/14- 62 yoM never smoker followed for asthma, allergic rhinitis, complicated by GERD, history of insect venom allergy, peripheral venous insufficiency FOLLOWS FOR: Since last wednesday having Upper Resp issues; cough-productive-brown in color, wheezing, SOB. Early fever and chills. Has not had any abx or prednisone. Asks to continue omeprazole which works well for controlling reflux symptoms. Likes Dulera inhaler.  02/19/2015-64 year old male never smoker followed for asthma, allergic rhinitis, complicated by GERD, history of insect venom allergy, peripheral venous insufficiency (son died of Hermansky-Pudlak synd) Follows for: Asthmatic Bronchitis. Pt c/o frequent cough with green mucus that has turned to bronhcitis several times. Pt reports multiple doses of abx in the past several months. Pt states that he gets the abx and gets better for some time and then it comes back. Pt denies any current cough/wheeze/SOB but does have fatigue.  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 Wheeze if he has to run. CXR 10/13/2014 IMPRESSION: No active cardiopulmonary disease. Electronically Signed  By: Harmon PierJeffrey Hu M.D.  On: 10/13/2014 16:12  ROS-see HPI Constitutional:   No-   weight loss, night sweats, fevers, chills, fatigue, lassitude. HEENT:   No-  headaches, difficulty swallowing, tooth/dental problems, sore throat,       No-  sneezing, itching, ear ache, nasal congestion, post nasal drip,  CV:  No-   chest pain, orthopnea, PND, +swelling in lower extremities, anasarca, dizziness, palpitations Resp: No-   shortness of breath with exertion or at rest.              +  productive cough,  No non-productive cough,  No- coughing up of blood.              +  change in color of mucus.  No- wheezing.   Skin: No-   rash or  lesions. GI:  No-   heartburn, indigestion, abdominal pain, nausea, vomiting,  GU: MS:  . Neuro-     nothing unusual Psych:  No- change in mood or affect. No depression or anxiety.  No memory loss.  OBJ General- Alert, Oriented, Affect-appropriate, Distress- none acute Skin- + multicolored brown spot right side of neck. Concerning. Lymphadenopathy- none Head- atraumatic            Eyes- Gross vision intact, PERRLA, conjunctivae clear secretions            Ears- Hearing, canals-normal            Nose- Clear, no-Septal dev, mucus, polyps, erosion, perforation             Throat- Mallampati II, drainage- none, tonsils- atrophic.  Neck- flexible , trachea midline, no stridor , thyroid nl, carotid no bruit Chest - symmetrical excursion , unlabored           Heart/CV- RRR , no murmur , no gallop  , no rub, nl s1 s2                           - JVD- none , edema+/ trace pitting, stasis changes- none, varices- none           Lung- cough +, unlabored , dullness-none, rub- none           Chest wall-  Abd-  Br/ Gen/ Rectal- Not done, not indicated Extrem- cyanosis- none, clubbing, none, atrophy- none, strength- nl.  Neuro- grossly intact to observation

## 2015-02-20 NOTE — Assessment & Plan Note (Signed)
Moderate intermittent Watch for triggers which could be controlled-avoid exposure to virus infections, minimize possibility of reflux Plan-pneumonia vaccine, doxycycline with refills as instructed, sample Anoro for trial, reflux precautions including elevate head of bed

## 2015-02-20 NOTE — Assessment & Plan Note (Signed)
Elevate head of bed, try Pepcid rather than a PPI

## 2015-02-20 NOTE — Assessment & Plan Note (Signed)
He has been careful to avoid exposure situations

## 2015-02-25 ENCOUNTER — Telehealth: Payer: Self-pay | Admitting: Internal Medicine

## 2015-02-25 MED ORDER — UMECLIDINIUM-VILANTEROL 62.5-25 MCG/INH IN AEPB: 1.0000 | INHALATION_SPRAY | Freq: Every day | RESPIRATORY_TRACT | Status: DC

## 2015-02-25 NOTE — Telephone Encounter (Signed)
Spoke with pt. At his last appointment CY changed Dulera to Anoro. Anoro has been working well and he would like a prescription. Rx has been sent in for 90 day supply per his request. Nothing further was needed.

## 2015-02-28 ENCOUNTER — Other Ambulatory Visit: Payer: Self-pay | Admitting: Internal Medicine

## 2015-08-04 ENCOUNTER — Other Ambulatory Visit: Payer: Self-pay | Admitting: Internal Medicine

## 2015-09-04 DIAGNOSIS — H43812 Vitreous degeneration, left eye: Secondary | ICD-10-CM | POA: Insufficient documentation

## 2015-11-22 ENCOUNTER — Other Ambulatory Visit: Payer: Self-pay | Admitting: Family Medicine

## 2015-11-22 DIAGNOSIS — Z125 Encounter for screening for malignant neoplasm of prostate: Secondary | ICD-10-CM

## 2015-11-22 DIAGNOSIS — E785 Hyperlipidemia, unspecified: Secondary | ICD-10-CM

## 2015-11-22 DIAGNOSIS — Z1159 Encounter for screening for other viral diseases: Secondary | ICD-10-CM

## 2015-11-23 ENCOUNTER — Other Ambulatory Visit (INDEPENDENT_AMBULATORY_CARE_PROVIDER_SITE_OTHER): Payer: BC Managed Care – PPO

## 2015-11-23 DIAGNOSIS — Z125 Encounter for screening for malignant neoplasm of prostate: Secondary | ICD-10-CM

## 2015-11-23 DIAGNOSIS — Z1159 Encounter for screening for other viral diseases: Secondary | ICD-10-CM | POA: Diagnosis not present

## 2015-11-23 DIAGNOSIS — E785 Hyperlipidemia, unspecified: Secondary | ICD-10-CM

## 2015-11-23 LAB — COMPREHENSIVE METABOLIC PANEL
ALK PHOS: 44 U/L (ref 39–117)
ALT: 17 U/L (ref 0–53)
AST: 12 U/L (ref 0–37)
Albumin: 3.8 g/dL (ref 3.5–5.2)
BUN: 27 mg/dL — ABNORMAL HIGH (ref 6–23)
CALCIUM: 8.3 mg/dL — AB (ref 8.4–10.5)
CO2: 29 mEq/L (ref 19–32)
Chloride: 105 mEq/L (ref 96–112)
Creatinine, Ser: 1.02 mg/dL (ref 0.40–1.50)
GFR: 78.28 mL/min (ref >60.00)
Glucose, Bld: 97 mg/dL (ref 70–99)
Potassium: 4.1 mEq/L (ref 3.5–5.1)
Sodium: 139 mEq/L (ref 135–145)
TOTAL PROTEIN: 6.2 g/dL (ref 6.0–8.3)
Total Bilirubin: 0.5 mg/dL (ref 0.2–1.2)

## 2015-11-23 LAB — LIPID PANEL
CHOLESTEROL: 132 mg/dL (ref 0–200)
HDL: 38.9 mg/dL — AB (ref >39.00)
LDL Cholesterol: 85 mg/dL (ref 0–99)
NonHDL: 93.1
Total CHOL/HDL Ratio: 3
Triglycerides: 42 mg/dL (ref 0.0–149.0)
VLDL: 8.4 mg/dL (ref 0.0–40.0)

## 2015-11-23 LAB — TSH: TSH: 2.42 u[IU]/mL (ref 0.35–4.50)

## 2015-11-23 LAB — PSA, MEDICARE: PSA: 1.35 ng/mL (ref 0.10–4.00)

## 2015-11-24 LAB — HEPATITIS C ANTIBODY: HCV AB: NEGATIVE

## 2015-11-27 ENCOUNTER — Encounter: Payer: Self-pay | Admitting: Family Medicine

## 2015-11-27 ENCOUNTER — Other Ambulatory Visit: Payer: Self-pay | Admitting: Internal Medicine

## 2015-11-27 ENCOUNTER — Ambulatory Visit (INDEPENDENT_AMBULATORY_CARE_PROVIDER_SITE_OTHER): Payer: BC Managed Care – PPO | Admitting: Family Medicine

## 2015-11-27 VITALS — BP 110/60 | HR 72 | Temp 98.4°F | Ht 69.5 in | Wt 208.5 lb

## 2015-11-27 DIAGNOSIS — J45909 Unspecified asthma, uncomplicated: Secondary | ICD-10-CM

## 2015-11-27 DIAGNOSIS — J449 Chronic obstructive pulmonary disease, unspecified: Secondary | ICD-10-CM

## 2015-11-27 DIAGNOSIS — Z23 Encounter for immunization: Secondary | ICD-10-CM | POA: Diagnosis not present

## 2015-11-27 DIAGNOSIS — Z0000 Encounter for general adult medical examination without abnormal findings: Secondary | ICD-10-CM | POA: Diagnosis not present

## 2015-11-27 DIAGNOSIS — E785 Hyperlipidemia, unspecified: Secondary | ICD-10-CM

## 2015-11-27 DIAGNOSIS — K219 Gastro-esophageal reflux disease without esophagitis: Secondary | ICD-10-CM

## 2015-11-27 NOTE — Addendum Note (Signed)
Addended by: Josph MachoANCE, Hodari Chuba A on: 11/27/2015 12:43 PM   Modules accepted: Orders

## 2015-11-27 NOTE — Progress Notes (Signed)
BP 110/60   Pulse 72   Temp 98.4 F (36.9 C) (Oral)   Ht 5' 9.5" (1.765 m)   Wt 208 lb 8 oz (94.6 kg)   BMI 30.35 kg/m    CC: CPE Subjective:    Patient ID: Terrence Middleton, male    DOB: 1950/10/30, 65 y.o.   MRN: [id number]  HPI: Terrence Middleton is a 66 y.o. male presenting on 11/27/2015 for Annual Exam   Exercise induced asthma/allergic rhinitis - on anoro ellipta and proair by Dr Maple Hudson.   GERD well controlled on famotidine 20mg  daily. PPI stopped because of recurrent infections and thrush.   Working on weight loss. Increased physical labor since retirement - working in the yard. Wears high boots. Noticing some pedal edema.   May consider kidney transplant for a friend.   Preventative: COLONOSCOPY Date: 11/2013 WNL Terrence Middleton) Prostate screening - discussed screening - pt would like to continue screens.  Flu shot - today Tetanus - 07/31/2005. Tdap today Pneumovax - 02/2015 Shingles shot - 11/2012 Sunscreen use discussed. Has seen dermatologist. Would like scalp lesions evaluated. Regularly wears hat.  Seat belt use discussed Never smoker Alcohol - 2 light beers/day   Lives with wife, dog Manufacturing engineer). Grown children. 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 Does walk at work and stays active at home. Diet: good water, fruits/vegetables daily  Relevant past medical, surgical, family and social history reviewed and updated as indicated. Interim medical history since our last visit reviewed. Allergies and medications reviewed and updated. Current Outpatient Prescriptions on File Prior to Visit  Medication Sig  . ANORO ELLIPTA 62.5-25 MCG/INH AEPB INHALE 1 PUFF INTO THE LUNGS DAILY.  Marland Kitchen PROAIR HFA 108 (90 BASE) MCG/ACT inhaler INHALE 2 PUFFS INTO THE LUNGS EVERY 4 (FOUR) HOURS AS NEEDED FOR WHEEZING OR SHORTNESS OF BREATH.  Marland Kitchen Sodium Chloride-Sodium  Bicarb (AYR SALINE NASAL RINSE) 1.57 G PACK Place into the nose. Uses every morning  . EPINEPHrine 0.3 mg/0.3 mL IJ SOAJ injection Inject into thigh for severe allergic reaction (Patient not taking: Reported on 11/27/2015)   No current facility-administered medications on file prior to visit.     Review of Systems  Constitutional: Negative for activity change, appetite change, chills, fatigue, fever and unexpected weight change.  HENT: Negative for hearing loss.   Eyes: Negative for visual disturbance.  Respiratory: Negative for cough, chest tightness, shortness of breath and wheezing.   Cardiovascular: Positive for leg swelling (mild). Negative for chest pain and palpitations.  Gastrointestinal: Negative for abdominal distention, abdominal pain, blood in stool, constipation, diarrhea, nausea and vomiting.  Genitourinary: Negative for difficulty urinating and hematuria.  Musculoskeletal: Negative for arthralgias, myalgias and neck pain.  Skin: Negative for rash.  Neurological: Negative for dizziness, seizures, syncope and headaches.  Hematological: Negative for adenopathy. Does not bruise/bleed easily.  Psychiatric/Behavioral: Negative for dysphoric mood. The patient is not nervous/anxious.    Per HPI unless specifically indicated in ROS section     Objective:    BP 110/60   Pulse 72   Temp 98.4 F (36.9 C) (Oral)   Ht 5' 9.5" (1.765 m)   Wt 208 lb 8 oz (94.6 kg)   BMI 30.35 kg/m   Wt Readings from Last 3 Encounters:  11/27/15 208 lb 8 oz (94.6 kg)  02/19/15 214 lb (97.1 kg)  01/26/15 213 lb 12 oz (97 kg)  Physical Exam  Constitutional: He is oriented to person, place, and time. He appears well-developed and well-nourished. No distress.  HENT:  Head: Normocephalic and atraumatic.  Right Ear: Hearing, tympanic membrane, external ear and ear canal normal.  Left Ear: Hearing, tympanic membrane, external ear and ear canal normal.  Nose: Nose normal.  Mouth/Throat: Uvula is  midline, oropharynx is clear and moist and mucous membranes are normal. No oropharyngeal exudate, posterior oropharyngeal edema or posterior oropharyngeal erythema.  Eyes: Conjunctivae and EOM are normal. Pupils are equal, round, and reactive to light. No scleral icterus.  Neck: Normal range of motion. Neck supple. No thyromegaly present.  Cardiovascular: Normal rate, regular rhythm, normal heart sounds and intact distal pulses.   No murmur heard. Pulses:      Radial pulses are 2+ on the right side, and 2+ on the left side.  Pulmonary/Chest: Effort normal and breath sounds normal. No respiratory distress. He has no wheezes. He has no rales.  Abdominal: Soft. Bowel sounds are normal. He exhibits no distension and no mass. There is no tenderness. There is no rebound and no guarding.  Genitourinary: Rectum normal and prostate normal. Rectal exam shows no external hemorrhoid, no internal hemorrhoid, no fissure, no mass, no tenderness and anal tone normal. Prostate is not enlarged (15gm) and not tender.  Musculoskeletal: Normal range of motion. He exhibits no edema.  Lymphadenopathy:    He has no cervical adenopathy.  Neurological: He is alert and oriented to person, place, and time.  CN grossly intact, station and gait intact  Skin: Skin is warm and dry. No rash noted.  Psychiatric: He has a normal mood and affect. His behavior is normal. Judgment and thought content normal.  Nursing note and vitals reviewed.  Results for orders placed or performed in visit on 11/23/15  Hepatitis C antibody  Result Value Ref Range   HCV Ab NEGATIVE NEGATIVE  Lipid panel  Result Value Ref Range   Cholesterol 132 0 - 200 mg/dL   Triglycerides 16.1 0.0 - 149.0 mg/dL   HDL 09.60 (L) >45.40 mg/dL   VLDL 8.4 0.0 - 98.1 mg/dL   LDL Cholesterol 85 0 - 99 mg/dL   Total CHOL/HDL Ratio 3    NonHDL 93.10   TSH  Result Value Ref Range   TSH 2.42 0.35 - 4.50 uIU/mL  Comprehensive metabolic panel  Result Value Ref  Range   Sodium 139 135 - 145 mEq/L   Potassium 4.1 3.5 - 5.1 mEq/L   Chloride 105 96 - 112 mEq/L   CO2 29 19 - 32 mEq/L   Glucose, Bld 97 70 - 99 mg/dL   BUN 27 (H) 6 - 23 mg/dL   Creatinine, Ser 1.91 0.40 - 1.50 mg/dL   Total Bilirubin 0.5 0.2 - 1.2 mg/dL   Alkaline Phosphatase 44 39 - 117 U/L   AST 12 0 - 37 U/L   ALT 17 0 - 53 U/L   Total Protein 6.2 6.0 - 8.3 g/dL   Albumin 3.8 3.5 - 5.2 g/dL   Calcium 8.3 (L) 8.4 - 10.5 mg/dL   GFR 47.82 >95.62 mL/min  PSA, Medicare  Result Value Ref Range   PSA 1.35 0.10 - 4.00 ng/ml      Assessment & Plan:   Problem List Items Addressed This Visit    Asthmatic bronchitis , chronic (HCC)    Chronic, stable on anoro ellipta and PRN proair.      Dyslipidemia    Chronic, stable off meds.  GERD    PPI changed to antihistamine - now stable on pepcid.       Relevant Medications   famotidine (PEPCID) 20 MG tablet   Healthcare maintenance - Primary    Preventative protocols reviewed and updated unless pt declined. Discussed healthy diet and lifestyle.        Other Visit Diagnoses    Need for influenza vaccination       Relevant Orders   Flu Vaccine QUAD 36+ mos PF IM (Fluarix & Fluzone Quad PF) (Completed)       Follow up plan: No Follow-up on file.  Eustaquio BoydenJavier Tyann Niehaus, MD

## 2015-11-27 NOTE — Assessment & Plan Note (Signed)
PPI changed to antihistamine - now stable on pepcid.

## 2015-11-27 NOTE — Assessment & Plan Note (Signed)
Chronic, stable off meds.  

## 2015-11-27 NOTE — Progress Notes (Signed)
Pre visit review using our clinic review tool, if applicable. No additional management support is needed unless otherwise documented below in the visit note. 

## 2015-11-27 NOTE — Assessment & Plan Note (Signed)
Chronic, stable on anoro ellipta and PRN proair.

## 2015-11-27 NOTE — Patient Instructions (Addendum)
Flu shot today, Tdap today.  You are doing well today. Return as needed or in 1 year for next physical.  Health Maintenance, Male A healthy lifestyle and preventative care can promote health and wellness.  Maintain regular health, dental, and eye exams.  Eat a healthy diet. Foods like vegetables, fruits, whole grains, low-fat dairy products, and lean protein foods contain the nutrients you need and are low in calories. Decrease your intake of foods high in solid fats, added sugars, and salt. Get information about a proper diet from your health care provider, if necessary.  Regular physical exercise is one of the most important things you can do for your health. Most adults should get at least 150 minutes of moderate-intensity exercise (any activity that increases your heart rate and causes you to sweat) each week. In addition, most adults need muscle-strengthening exercises on 2 or more days a week.   Maintain a healthy weight. The body mass index (BMI) is a screening tool to identify possible weight problems. It provides an estimate of body fat based on height and weight. Your health care provider can find your BMI and can help you achieve or maintain a healthy weight. For males 20 years and older:  A BMI below 18.5 is considered underweight.  A BMI of 18.5 to 24.9 is normal.  A BMI of 25 to 29.9 is considered overweight.  A BMI of 30 and above is considered obese.  Maintain normal blood lipids and cholesterol by exercising and minimizing your intake of saturated fat. Eat a balanced diet with plenty of fruits and vegetables. Blood tests for lipids and cholesterol should begin at age 63 and be repeated every 5 years. If your lipid or cholesterol levels are high, you are over age 63, or you are at high risk for heart disease, you may need your cholesterol levels checked more frequently.Ongoing high lipid and cholesterol levels should be treated with medicines if diet and exercise are not  working.  If you smoke, find out from your health care provider how to quit. If you do not use tobacco, do not start.  Lung cancer screening is recommended for adults aged 55-80 years who are at high risk for developing lung cancer because of a history of smoking. A yearly low-dose CT scan of the lungs is recommended for people who have at least a 30-pack-year history of smoking and are current smokers or have quit within the past 15 years. A pack year of smoking is smoking an average of 1 pack of cigarettes a day for 1 year (for example, a 30-pack-year history of smoking could mean smoking 1 pack a day for 30 years or 2 packs a day for 15 years). Yearly screening should continue until the smoker has stopped smoking for at least 15 years. Yearly screening should be stopped for people who develop a health problem that would prevent them from having lung cancer treatment.  If you choose to drink alcohol, do not have more than 2 drinks per day. One drink is considered to be 12 oz (360 mL) of beer, 5 oz (150 mL) of wine, or 1.5 oz (45 mL) of liquor.  Avoid the use of street drugs. Do not share needles with anyone. Ask for help if you need support or instructions about stopping the use of drugs.  High blood pressure causes heart disease and increases the risk of stroke. High blood pressure is more likely to develop in:  People who have blood pressure in the  end of the normal range (100-139/85-89 mm Hg).  People who are overweight or obese.  People who are African American.  If you are 70-73 years of age, have your blood pressure checked every 3-5 years. If you are 37 years of age or older, have your blood pressure checked every year. You should have your blood pressure measured twice--once when you are at a hospital or clinic, and once when you are not at a hospital or clinic. Record the average of the two measurements. To check your blood pressure when you are not at a hospital or clinic, you can  use:  An automated blood pressure machine at a pharmacy.  A home blood pressure monitor.  If you are 9-5 years old, ask your health care provider if you should take aspirin to prevent heart disease.  Diabetes screening involves taking a blood sample to check your fasting blood sugar level. This should be done once every 3 years after age 82 if you are at a normal weight and without risk factors for diabetes. Testing should be considered at a younger age or be carried out more frequently if you are overweight and have at least 1 risk factor for diabetes.  Colorectal cancer can be detected and often prevented. Most routine colorectal cancer screening begins at the age of 9 and continues through age 58. However, your health care provider may recommend screening at an earlier age if you have risk factors for colon cancer. On a yearly basis, your health care provider may provide home test kits to check for hidden blood in the stool. A small camera at the end of a tube may be used to directly examine the colon (sigmoidoscopy or colonoscopy) to detect the earliest forms of colorectal cancer. Talk to your health care provider about this at age 27 when routine screening begins. A direct exam of the colon should be repeated every 5-10 years through age 41, unless early forms of precancerous polyps or small growths are found.  People who are at an increased risk for hepatitis B should be screened for this virus. You are considered at high risk for hepatitis B if:  You were born in a country where hepatitis B occurs often. Talk with your health care provider about which countries are considered high risk.  Your parents were born in a high-risk country and you have not received a shot to protect against hepatitis B (hepatitis B vaccine).  You have HIV or AIDS.  You use needles to inject street drugs.  You live with, or have sex with, someone who has hepatitis B.  You are a man who has sex with other  men (MSM).  You get hemodialysis treatment.  You take certain medicines for conditions like cancer, organ transplantation, and autoimmune conditions.  Hepatitis C blood testing is recommended for all people born from [birth year] through 1963 and any individual with known risk factors for hepatitis C.  Healthy men should no longer receive prostate-specific antigen (PSA) blood tests as part of routine cancer screening. Talk to your health care provider about prostate cancer screening.  Testicular cancer screening is not recommended for adolescents or adult males who have no symptoms. Screening includes self-exam, a health care provider exam, and other screening tests. Consult with your health care provider about any symptoms you have or any concerns you have about testicular cancer.  Practice safe sex. Use condoms and avoid high-risk sexual practices to reduce the spread of sexually transmitted infections (STIs).  You should  be screened for STIs, including gonorrhea and chlamydia if:  You are sexually active and are younger than 24 years.  You are older than 24 years, and your health care provider tells you that you are at risk for this type of infection.  Your sexual activity has changed since you were last screened, and you are at an increased risk for chlamydia or gonorrhea. Ask your health care provider if you are at risk.  If you are at risk of being infected with HIV, it is recommended that you take a prescription medicine daily to prevent HIV infection. This is called pre-exposure prophylaxis (PrEP). You are considered at risk if:  You are a man who has sex with other men (MSM).  You are a heterosexual man who is sexually active with multiple partners.  You take drugs by injection.  You are sexually active with a partner who has HIV.  Talk with your health care provider about whether you are at high risk of being infected with HIV. If you choose to begin PrEP, you should first be tested  for HIV. You should then be tested every 3 months for as long as you are taking PrEP.  Use sunscreen. Apply sunscreen liberally and repeatedly throughout the day. You should seek shade when your shadow is shorter than you. Protect yourself by wearing long sleeves, pants, a wide-brimmed hat, and sunglasses year round whenever you are outdoors.  Tell your health care provider of new moles or changes in moles, especially if there is a change in shape or color. Also, tell your health care provider if a mole is larger than the size of a pencil eraser.  A one-time screening for abdominal aortic aneurysm (AAA) and surgical repair of large AAAs by ultrasound is recommended for men aged 19-75 years who are current or former smokers.  Stay current with your vaccines (immunizations).   This information is not intended to replace advice given to you by your health care provider. Make sure you discuss any questions you have with your health care provider.   Document Released: 08/20/2007 Document Revised: 03/14/2014 Document Reviewed: 07/19/2010 Elsevier Interactive Patient Education [employer].

## 2015-11-27 NOTE — Assessment & Plan Note (Signed)
Preventative protocols reviewed and updated unless pt declined. Discussed healthy diet and lifestyle.  

## 2016-02-22 ENCOUNTER — Encounter: Payer: Self-pay | Admitting: Internal Medicine

## 2016-02-22 ENCOUNTER — Ambulatory Visit: Payer: BC Managed Care – PPO | Admitting: Internal Medicine

## 2016-02-22 ENCOUNTER — Ambulatory Visit (INDEPENDENT_AMBULATORY_CARE_PROVIDER_SITE_OTHER): Payer: BC Managed Care – PPO | Admitting: Internal Medicine

## 2016-02-22 VITALS — BP 124/72 | HR 66 | Ht 69.5 in | Wt 220.4 lb

## 2016-02-22 DIAGNOSIS — Z23 Encounter for immunization: Secondary | ICD-10-CM | POA: Diagnosis not present

## 2016-02-22 DIAGNOSIS — J449 Chronic obstructive pulmonary disease, unspecified: Secondary | ICD-10-CM

## 2016-02-22 DIAGNOSIS — Z91038 Other insect allergy status: Secondary | ICD-10-CM | POA: Diagnosis not present

## 2016-02-22 MED ORDER — EPINEPHRINE 0.3 MG/0.3ML IJ SOAJ: INTRAMUSCULAR | 99 refills | Status: DC

## 2016-02-22 MED ORDER — UMECLIDINIUM-VILANTEROL 62.5-25 MCG/INH IN AEPB: INHALATION_SPRAY | RESPIRATORY_TRACT | 3 refills | Status: DC

## 2016-02-22 NOTE — Patient Instructions (Addendum)
Prevnar 13 pneumococcal pneumonia vaccine  Suggest repeat Pneumovax 23 vaccine once more around age 63  Script sent refilling Anoro  Script sent for Epipen with coupon  Call for refills or if we can help  Order- schedule PFT

## 2016-02-22 NOTE — Progress Notes (Signed)
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 Pt will need refill for Anoro inhaler-works well for him. He has not needed much antibiotic over the last year once a stretch of recurrent bronchitis ended. Anoro works well. Still notices some dyspnea on exertion running without progression. Little routine cough and no wheeze or sleep disturbance. We discussed pneumonia vaccine strategies. He keeps EpiPen available because of his bee- keeping hobby  ROS-see HPI Constitutional:   No-   weight loss, night sweats, fevers, chills, fatigue, lassitude. HEENT:   No-  headaches, difficulty swallowing, tooth/dental problems, sore throat,       No-  sneezing, itching, ear ache, nasal congestion, post nasal drip,  CV:  No-   chest pain, orthopnea, PND, swelling in lower extremities, anasarca, dizziness, palpitations Resp: +shortness of breath with exertion or at rest.                productive cough,  No non-productive cough,  No- coughing up of blood.               change in color of mucus.  No- wheezing.   Skin: No-   rash or lesions. GI:  No-   heartburn, indigestion, abdominal pain, nausea, vomiting,  GU: MS:  . Neuro-     nothing unusual Psych:  No- change in mood or affect. No depression or anxiety.  No memory loss.  OBJ General- Alert, Oriented, Affect-appropriate, Distress- none acute Skin- + multicolored brown spot right side of neck.  Concerning. Lymphadenopathy- none Head- atraumatic            Eyes- Gross vision intact, PERRLA, conjunctivae clear secretions            Ears- Hearing, canals-normal            Nose- Clear, no-Septal dev, mucus, polyps, erosion, perforation             Throat- Mallampati II, drainage- none, tonsils- atrophic.  Neck- flexible , trachea midline, no stridor , thyroid nl, carotid no bruit Chest - symmetrical excursion , unlabored           Heart/CV- RRR , no murmur , no gallop  , no rub, nl s1 s2                           - JVD- none , edema-none, stasis changes- none, varices- none           Lung- cough-none, unlabored , dullness-none, rub- none           Chest wall-  Abd-  Br/ Gen/ Rectal- Not done, not indicated Extrem- cyanosis- none, clubbing, none, atrophy- none, strength- nl.  Neuro- grossly intact to observation

## 2016-02-23 ENCOUNTER — Telehealth: Payer: Self-pay | Admitting: Internal Medicine

## 2016-02-23 NOTE — Telephone Encounter (Signed)
lmtcb for pt.  

## 2016-02-23 NOTE — Telephone Encounter (Signed)
[facility] the [facility]  [facility] spoke to [place] pt aware

## 2016-03-04 ENCOUNTER — Ambulatory Visit (HOSPITAL_COMMUNITY)
Admission: RE | Admit: 2016-03-04 | Discharge: 2016-03-04 | Disposition: A | Discharge status: Home / Self Care | Payer: BC Managed Care – PPO | Source: Ambulatory Visit | Attending: Internal Medicine | Admitting: Internal Medicine

## 2016-03-04 DIAGNOSIS — R942 Abnormal results of pulmonary function studies: Secondary | ICD-10-CM | POA: Diagnosis not present

## 2016-03-04 DIAGNOSIS — J449 Chronic obstructive pulmonary disease, unspecified: Secondary | ICD-10-CM | POA: Diagnosis present

## 2016-03-04 LAB — PULMONARY FUNCTION TEST
DL/VA % PRED: 89 %
DL/VA: 4.08 ml/min/mmHg/L
DLCO UNC: 29.28 ml/min/mmHg
DLCO unc % pred: 92 %
FEF 25-75 POST: 2.34 L/s
FEF 25-75 Pre: 1.95 L/sec
FEF2575-%Change-Post: 20 %
FEF2575-%PRED-POST: 85 %
FEF2575-%Pred-Pre: 71 %
FEV1-%Change-Post: -2 %
FEV1-%Pred-Post: 89 %
FEV1-%Pred-Pre: 92 %
FEV1-Post: 3.07 L
FEV1-Pre: 3.15 L
FEV1FVC-%Change-Post: -3 %
FEV1FVC-%PRED-PRE: 91 %
FEV6-%Change-Post: 3 %
FEV6-%PRED-POST: 105 %
FEV6-%Pred-Pre: 101 %
FEV6-Post: 4.58 L
FEV6-Pre: 4.42 L
FEV6FVC-%CHANGE-POST: 2 %
FEV6FVC-%Pred-Post: 103 %
FEV6FVC-%Pred-Pre: 101 %
FVC-%Change-Post: 1 %
FVC-%PRED-POST: 101 %
FVC-%PRED-PRE: 100 %
FVC-PRE: 4.58 L
FVC-Post: 4.64 L
POST FEV1/FVC RATIO: 66 %
Post FEV6/FVC ratio: 99 %
Pre FEV1/FVC ratio: 69 %
Pre FEV6/FVC Ratio: 97 %
RV % pred: 79 %
RV: 1.81 L
TLC % pred: 96 %
TLC: 6.69 L

## 2016-03-04 MED ORDER — ALBUTEROL SULFATE (2.5 MG/3ML) 0.083% IN NEBU
2.5000 mg | INHALATION_SOLUTION | Freq: Once | RESPIRATORY_TRACT | Status: AC
  Administered 2016-03-04: 2.5 mg via RESPIRATORY_TRACT

## 2016-03-13 NOTE — Assessment & Plan Note (Addendum)
He is careful.   plan-refill EpiPen with discussion

## 2016-03-13 NOTE — Assessment & Plan Note (Signed)
Better control he attributes to [employer] Corningnoro Ellipta inhaler. I suggested Prevnar vaccine now and Pneumovax-20 3 repeat at age 66

## 2016-04-15 ENCOUNTER — Encounter: Payer: Self-pay | Admitting: Family Medicine

## 2016-04-15 ENCOUNTER — Ambulatory Visit (INDEPENDENT_AMBULATORY_CARE_PROVIDER_SITE_OTHER): Payer: BC Managed Care – PPO | Admitting: Family Medicine

## 2016-04-15 VITALS — BP 126/72 | HR 66 | Temp 98.2°F | Wt 221.5 lb

## 2016-04-15 DIAGNOSIS — J029 Acute pharyngitis, unspecified: Secondary | ICD-10-CM

## 2016-04-15 LAB — POCT RAPID STREP A (OFFICE): Rapid Strep A Screen: NEGATIVE

## 2016-04-15 NOTE — Patient Instructions (Addendum)
Strep test negative. Sore throat should continue to improve each day. Possibly from drainage.  May take ibuprofen 400mg  with meals over next few days.  Good to see you today.

## 2016-04-15 NOTE — Progress Notes (Signed)
Pre visit review using our clinic review tool, if applicable. No additional management support is needed unless otherwise documented below in the visit note. 

## 2016-04-15 NOTE — Assessment & Plan Note (Signed)
Anticipate residual from PNDrainage after recent bronchitis he treated with doxy. Strep test negative today. Supportive care discussed.

## 2016-04-15 NOTE — Progress Notes (Signed)
BP 126/72   Pulse 66   Temp 98.2 F (36.8 C) (Oral)   Wt 221 lb 8 oz (100.5 kg)   SpO2 98%   BMI 32.24 kg/m    CC: ST Subjective:    Patient ID: Terrence Middleton, male    DOB: 05-19-1950, 66 y.o.   MRN: [id number]  HPI: [name] is a 66 y.o. male presenting on 04/15/2016 for Sore Throat (x12 days)   12d h/o cold sxs, chest congestion, sore throat with PNdrainage, green mucous coming up. Started doxy Rx he had leftover from Dr Maple Hudson. Some persistent sore throat. Irritative cough present. Feverish at night. No swollen glands or abd pain.   Taking guaifenesin and dextromethorphan.   H/o strep pharyngitis and wanted to be checked for this.   Using anoro for asthmatic bronchitis.  DIL with norovirus GI bug recently.  Grand daughter stays in daycare.   GERD - stable on pepcid 20mg  daily.   Relevant past medical, surgical, family and social history reviewed and updated as indicated. Interim medical history since our last visit reviewed. Allergies and medications reviewed and updated. Current Outpatient Prescriptions on File Prior to Visit  Medication Sig  . EPINEPHrine 0.3 mg/0.3 mL IJ SOAJ injection Inject into thigh for severe allergic reaction  . famotidine (PEPCID) 20 MG tablet Take 20 mg by mouth daily.  Marland Kitchen PROAIR HFA 108 (90 BASE) MCG/ACT inhaler INHALE 2 PUFFS INTO THE LUNGS EVERY 4 (FOUR) HOURS AS NEEDED FOR WHEEZING OR SHORTNESS OF BREATH.  Marland Kitchen Sodium Chloride-Sodium Bicarb (AYR SALINE NASAL RINSE) 1.57 G PACK Place into the nose. Uses every morning  . umeclidinium-vilanterol (ANORO ELLIPTA) 62.5-25 MCG/INH AEPB INHALE 1 PUFF INTO THE LUNGS DAILY.   No current facility-administered medications on file prior to visit.     Review of Systems Per HPI unless specifically indicated in ROS section     Objective:    BP 126/72   Pulse 66   Temp 98.2 F (36.8 C) (Oral)   Wt 221 lb 8 oz (100.5 kg)   SpO2 98%   BMI 32.24 kg/m   Wt Readings from Last 3 Encounters:    04/15/16 221 lb 8 oz (100.5 kg)  02/22/16 220 lb 6.4 oz (100 kg)  11/27/15 208 lb 8 oz (94.6 kg)    Physical Exam  Constitutional: He appears well-developed and well-nourished. No distress.  HENT:  Head: Normocephalic and atraumatic.  Right Ear: Hearing, tympanic membrane, external ear and ear canal normal.  Left Ear: Hearing, tympanic membrane, external ear and ear canal normal.  Nose: Mucosal edema (nasal mucosal inflammation L>R) present. No rhinorrhea. Right sinus exhibits no maxillary sinus tenderness and no frontal sinus tenderness. Left sinus exhibits no maxillary sinus tenderness and no frontal sinus tenderness.  Mouth/Throat: Uvula is midline and mucous membranes are normal. Posterior oropharyngeal erythema (mild) present. No oropharyngeal exudate, posterior oropharyngeal edema or tonsillar abscesses.  Eyes: Conjunctivae and EOM are normal. Pupils are equal, round, and reactive to light. No scleral icterus.  Neck: Normal range of motion. Neck supple.  Cardiovascular: Normal rate, regular rhythm, normal heart sounds and intact distal pulses.   No murmur heard. Pulmonary/Chest: Effort normal and breath sounds normal. No respiratory distress. He has no wheezes. He has no rales.  Lungs clear  Lymphadenopathy:    He has no cervical adenopathy.  Skin: Skin is warm and dry. No rash noted.  Nursing note and vitals reviewed.   Results for orders placed or performed in visit  on 04/15/16  POCT rapid strep A  Result Value Ref Range   Rapid Strep A Screen Negative Negative      Assessment & Plan:   Problem List Items Addressed This Visit    Sore throat - Primary    Anticipate residual from PNDrainage after recent bronchitis he treated with doxy. Strep test negative today. Supportive care discussed.       Relevant Orders   POCT rapid strep A (Completed)       Follow up plan: Return if symptoms worsen or fail to improve.  Terrence BoydenJavier Charline Hoskinson, MD

## 2016-06-01 ENCOUNTER — Ambulatory Visit (INDEPENDENT_AMBULATORY_CARE_PROVIDER_SITE_OTHER): Payer: BC Managed Care – PPO | Admitting: Primary Care

## 2016-06-01 ENCOUNTER — Encounter: Payer: Self-pay | Admitting: Primary Care

## 2016-06-01 VITALS — BP 118/70 | HR 85 | Temp 98.5°F | Ht 69.5 in | Wt 221.1 lb

## 2016-06-01 DIAGNOSIS — N390 Urinary tract infection, site not specified: Secondary | ICD-10-CM

## 2016-06-01 MED ORDER — AZITHROMYCIN 250 MG PO TABS: ORAL_TABLET | ORAL | 0 refills | Status: DC

## 2016-06-01 MED ORDER — HYDROCOD POLST-CPM POLST ER 10-8 MG/5ML PO SUER: 5.0000 mL | Freq: Two times a day (BID) | ORAL | 0 refills | Status: DC | PRN

## 2016-06-01 NOTE — Progress Notes (Signed)
Pre visit review using our clinic review tool, if applicable. No additional management support is needed unless otherwise documented below in the visit note. 

## 2016-06-01 NOTE — Progress Notes (Signed)
Subjective:    Patient ID: Terrence Middleton, male    DOB: 1950/09/27, 66 y.o.   MRN: [id number]  HPI  Terrence Middleton is a 66 year old male with a history of allergic rhinitis, asthma, and GERD who presents today with a chief complaint of cough. He also reports chest congestion, chills, nights sweats, nasal congestion, sinus pressure. His symptoms have been present for the past two weeks. He felt better one week ago but then Friday last week started feeling much worse. He's been taking Mucinex, antihistamines, OTC cough suppressants with temporary improvement. His cough is productive with yellow sputum. Overall he's feeling much worse.    Review of Systems  Constitutional: Positive for chills and fatigue.  HENT: Positive for congestion, ear pain, postnasal drip and sinus pressure.   Respiratory: Positive for cough and chest tightness. Negative for shortness of breath and wheezing.   Cardiovascular: Negative for chest pain.       Past Medical History:  Diagnosis Date  . ALLERGIC RHINITIS 02/14/2008   Young MD, Joni Fears D   . Allergy to insect stings 02/10/2011   Hymenoptera sting   . Asthma    exercise induced per patient Terrence Middleton)  . GERD (gastroesophageal reflux disease)      Social History   Social History  . Marital status: Married    Spouse name: N/A  . Number of children: 1  . Years of education: N/A   Occupational History  . Art gallery manager for [employer]    Social History Main Topics  . Smoking status: Never Smoker  . Smokeless tobacco: Never Used  . Alcohol use 3.0 - 3.6 oz/week    5 - 6 Cans of beer per week     Comment: occasionally beer  . Drug use: No  . Sexual activity: Not on file   Other Topics Concern  . Not on file   Social History Narrative   Lives with wife, dog Manufacturing engineer).  Grown children.   33yo son [name] passed away Jul 18, 2014 (pulm fibrosis - Hermanske-Pudlike syndrome)   Occupation: Technical sales engineer at [employer]   Activity: no regular  exercise.  Does walk at work and stays active at home.   Diet: good water, fruits/vegetables daily    Past Surgical History:  Procedure Laterality Date  . cervical fracture  18-Jul-2006   fractured c3 after fall  . COLONOSCOPY  05/2006   colon polyps Juanda Chance)  . COLONOSCOPY  11/2013   WNL Juanda Chance)    Family History  Problem Relation Age of Onset  . Asthma Mother   . COPD Mother   . Emphysema Father   . Lymphoma Father 87  . Crohn's disease Son     Colonectomy  . Colon cancer Neg Hx   . Pulmonary disease Son 22    passed away    Allergies  Allergen Reactions  . Sulfonamide Derivatives Other (See Comments)    Pt states he has stomach pain with these drugs    Current Outpatient Prescriptions on File Prior to Visit  Medication Sig Dispense Refill  . famotidine (PEPCID) 20 MG tablet Take 20 mg by mouth daily.    Marland Kitchen PROAIR HFA 108 (90 BASE) MCG/ACT inhaler INHALE 2 PUFFS INTO THE LUNGS EVERY 4 (FOUR) HOURS AS NEEDED FOR WHEEZING OR SHORTNESS OF BREATH. 8.5 Inhaler 3  . Sodium Chloride-Sodium Bicarb (AYR SALINE NASAL RINSE) 1.57 G PACK Place into the nose. Uses every morning    . umeclidinium-vilanterol (ANORO ELLIPTA) 62.5-25 MCG/INH  AEPB INHALE 1 PUFF INTO THE LUNGS DAILY. 180 each 3  . EPINEPHrine 0.3 mg/0.3 mL IJ SOAJ injection Inject into thigh for severe allergic reaction (Patient not taking: Reported on 06/01/2016) 1 Device prn   No current facility-administered medications on file prior to visit.     BP 118/70   Pulse 85   Temp 98.5 F (36.9 C) (Oral)   Ht 5' 9.5" (1.765 m)   Wt 221 lb 1.9 oz (100.3 kg)   SpO2 95%   BMI 32.19 kg/m    Objective:   Physical Exam  Constitutional: He appears well-nourished. He does not appear ill.  HENT:  Right Ear: Tympanic membrane and ear canal normal.  Left Ear: Tympanic membrane and ear canal normal.  Nose: No mucosal edema. Right sinus exhibits no maxillary sinus tenderness and no frontal sinus tenderness. Left sinus exhibits no  maxillary sinus tenderness and no frontal sinus tenderness.  Mouth/Throat: Oropharynx is clear and moist.  Eyes: Conjunctivae are normal.  Neck: Neck supple.  Cardiovascular: Normal rate and regular rhythm.   Pulmonary/Chest: Effort normal. He has no decreased breath sounds. He has wheezes in the right upper field and the left upper field. He has no rhonchi. He has no rales.  Skin: Skin is warm and dry.          Assessment & Plan:  URI:  Cough, congestion, fatigue x 2 weeks. Overall no no improvement with OTC treatment. Exam today with mild wheezing, otherwise clear. He does appear acutely ill. Given duration of symptoms coupled with presentation, will treat. Rx for Zpak sent to pharmacy. Continue guaifenesin. Rx for Tussionex printed for HS cough. Fluids, rest, follow up PRN.  Morrie Sheldonlark,Cynia Abruzzo Kendal, NP

## 2016-06-01 NOTE — Patient Instructions (Signed)
Start Azithromycin antibiotics. Take 2 tablets by mouth today, then 1 tablet daily for 4 additional days.  You may take the Tussionex cough suppressant twice daily as needed for cough and rest. Caution this medication contains codeine and will make you feel drowsy.  Continue guaifenesin for chest congestion.   Ensure you are staying hydrated with water and rest.  It was a pleasure meeting you!

## 2016-08-18 ENCOUNTER — Other Ambulatory Visit: Payer: Self-pay | Admitting: Internal Medicine

## 2016-12-18 ENCOUNTER — Other Ambulatory Visit: Payer: Self-pay | Admitting: Family Medicine

## 2016-12-18 ENCOUNTER — Encounter: Payer: Self-pay | Admitting: Family Medicine

## 2016-12-18 DIAGNOSIS — K219 Gastro-esophageal reflux disease without esophagitis: Secondary | ICD-10-CM

## 2016-12-18 DIAGNOSIS — E785 Hyperlipidemia, unspecified: Secondary | ICD-10-CM

## 2016-12-19 ENCOUNTER — Other Ambulatory Visit (INDEPENDENT_AMBULATORY_CARE_PROVIDER_SITE_OTHER): Payer: BC Managed Care – PPO

## 2016-12-19 DIAGNOSIS — E785 Hyperlipidemia, unspecified: Secondary | ICD-10-CM | POA: Diagnosis not present

## 2016-12-19 DIAGNOSIS — K219 Gastro-esophageal reflux disease without esophagitis: Secondary | ICD-10-CM

## 2016-12-19 LAB — CBC WITH DIFFERENTIAL/PLATELET
Basophils Absolute: 0 10*3/uL (ref 0.0–0.1)
Basophils Relative: 1.1 % (ref 0.0–3.0)
EOS ABS: 0.2 10*3/uL (ref 0.0–0.7)
EOS PCT: 4.1 % (ref 0.0–5.0)
HEMATOCRIT: 43.2 % (ref 39.0–52.0)
HEMOGLOBIN: 14.4 g/dL (ref 13.0–17.0)
Lymphocytes Relative: 36.3 % (ref 12.0–46.0)
Lymphs Abs: 1.5 10*3/uL (ref 0.7–4.0)
MCHC: 33.4 g/dL (ref 30.0–36.0)
MCV: 80.8 fl (ref 78.0–100.0)
MONO ABS: 0.5 10*3/uL (ref 0.1–1.0)
Monocytes Relative: 11.5 % (ref 3.0–12.0)
NEUTROS ABS: 1.9 10*3/uL (ref 1.4–7.7)
Neutrophils Relative %: 47 % (ref 43.0–77.0)
PLATELETS: 223 10*3/uL (ref 150.0–400.0)
RBC: 5.35 Mil/uL (ref 4.22–5.81)
RDW: 13.6 % (ref 11.5–15.5)
WBC: 4 10*3/uL (ref 4.0–10.5)

## 2016-12-19 LAB — BASIC METABOLIC PANEL
BUN: 18 mg/dL (ref 6–23)
CALCIUM: 8.6 mg/dL (ref 8.4–10.5)
CHLORIDE: 106 meq/L (ref 96–112)
CO2: 26 meq/L (ref 19–32)
CREATININE: 0.92 mg/dL (ref 0.40–1.50)
GFR: 87.88 mL/min (ref >60.00)
GLUCOSE: 102 mg/dL — AB (ref 70–99)
Potassium: 4 mEq/L (ref 3.5–5.1)
Sodium: 139 mEq/L (ref 135–145)

## 2016-12-19 LAB — LIPID PANEL
CHOLESTEROL: 132 mg/dL (ref 0–200)
HDL: 36.1 mg/dL — AB (ref >39.00)
LDL Cholesterol: 85 mg/dL (ref 0–99)
NONHDL: 95.93
TRIGLYCERIDES: 57 mg/dL (ref 0.0–149.0)
Total CHOL/HDL Ratio: 4
VLDL: 11.4 mg/dL (ref 0.0–40.0)

## 2016-12-19 LAB — TSH: TSH: 2.32 u[IU]/mL (ref 0.35–4.50)

## 2016-12-20 ENCOUNTER — Other Ambulatory Visit: Payer: BC Managed Care – PPO

## 2016-12-27 ENCOUNTER — Encounter: Payer: Self-pay | Admitting: Family Medicine

## 2016-12-27 ENCOUNTER — Ambulatory Visit (INDEPENDENT_AMBULATORY_CARE_PROVIDER_SITE_OTHER): Payer: BC Managed Care – PPO | Admitting: Family Medicine

## 2016-12-27 VITALS — BP 118/60 | Temp 97.9°F | Ht 69.5 in | Wt 199.8 lb

## 2016-12-27 DIAGNOSIS — H43812 Vitreous degeneration, left eye: Secondary | ICD-10-CM

## 2016-12-27 DIAGNOSIS — Z125 Encounter for screening for malignant neoplasm of prostate: Secondary | ICD-10-CM | POA: Diagnosis not present

## 2016-12-27 DIAGNOSIS — Z23 Encounter for immunization: Secondary | ICD-10-CM | POA: Diagnosis not present

## 2016-12-27 DIAGNOSIS — E785 Hyperlipidemia, unspecified: Secondary | ICD-10-CM

## 2016-12-27 DIAGNOSIS — Z0000 Encounter for general adult medical examination without abnormal findings: Secondary | ICD-10-CM

## 2016-12-27 LAB — PSA: PSA: 1.36 ng/mL (ref 0.10–4.00)

## 2016-12-27 NOTE — Assessment & Plan Note (Addendum)
Preventative protocols reviewed and updated unless pt declined. Discussed healthy diet and lifestyle.  Congratulated on weight loss to date.  

## 2016-12-27 NOTE — Progress Notes (Signed)
BP 118/60 (BP Location: Left Arm, Patient Position: Sitting, Cuff Size: Normal)   Temp 97.9 F (36.6 C) (Oral)   Ht 5' 9.5" (1.765 m)   Wt 199 lb 12 oz (90.6 kg)   SpO2 96%   BMI 29.08 kg/m    CC: CPE Subjective:    Patient ID: Terrence Middleton, male    DOB: Jun 05, 1950, 66 y.o.   MRN: [id number]  HPI: Terrence Middleton is a 66 y.o. male presenting on 12/27/2016 for Annual Exam (Wants to discuss vitamins)   Exercise induced asthma - he stopped anoro 09/2016 - has not had any asthma exacerbations. Has f/u with pulm Dr Maple HudsonYoung 02/2017. Has not needed albuterol.   22 lb weight loss in last year - healthier diet and more active lifestyle.   Saw ophthalmologist - started on lutein 10mg  daily for L posterior vitreous detachment. Developing cataracts. Sees yearly.   Preventative: COLONOSCOPY Date: 11/2013 WNL Juanda Chance(B[place]ie) Prostate screening - pt would like to continue screens.  Flu shot yearly Tetanus - 2007. Tdap 2017 Pneumovax - 02/2015, prevnar 2017 zostavax - 11/2012 shingrix - discussed  Sunscreen use discussed. Wants mole on head checked.  Seat belt use discussed.  Never smoker Alcohol - 2 light beers/day   Lives with wife, dog Manufacturing engineer(Beagle). Grown children. 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 Does walk at work and stays active at home. Diet: good water, fruits/vegetables daily  Relevant past medical, surgical, family and social history reviewed and updated as indicated. Interim medical history since our last visit reviewed. Allergies and medications reviewed and updated. Outpatient Medications Prior to Visit  Medication Sig Dispense Refill  . EPINEPHrine 0.3 mg/0.3 mL IJ SOAJ injection Inject into thigh for severe allergic reaction 1 Device prn  . famotidine (PEPCID) 20 MG tablet Take 20 mg by mouth daily.    Marland Kitchen. PROAIR HFA 108 (90 BASE) MCG/ACT  inhaler INHALE 2 PUFFS INTO THE LUNGS EVERY 4 (FOUR) HOURS AS NEEDED FOR WHEEZING OR SHORTNESS OF BREATH. 8.5 Inhaler 3  . Sodium Chloride-Sodium Bicarb (AYR SALINE NASAL RINSE) 1.57 G PACK Place into the nose. Uses every morning    . azithromycin (ZITHROMAX) 250 MG tablet Take 2 tablets by mouth today, then 1 tablet daily for 4 additional days. 6 tablet 0  . chlorpheniramine-HYDROcodone (TUSSIONEX PENNKINETIC ER) 10-8 MG/5ML SUER Take 5 mLs by mouth every 12 (twelve) hours as needed for cough. 50 mL 0  . ANORO ELLIPTA 62.5-25 MCG/INH AEPB INHALE 1 PUFF INTO THE LUNGS DAILY. (Patient not taking: Reported on 12/27/2016) 180 each 1   No facility-administered medications prior to visit.      Per HPI unless specifically indicated in ROS section below Review of Systems     Objective:    BP 118/60 (BP Location: Left Arm, Patient Position: Sitting, Cuff Size: Normal)   Temp 97.9 F (36.6 C) (Oral)   Ht 5' 9.5" (1.765 m)   Wt 199 lb 12 oz (90.6 kg)   SpO2 96%   BMI 29.08 kg/m   Wt Readings from Last 3 Encounters:  12/27/16 199 lb 12 oz (90.6 kg)  06/01/16 221 lb 1.9 oz (100.3 kg)  04/15/16 221 lb 8 oz (100.5 kg)    Physical Exam  Constitutional: He is oriented to person, place, and time. He appears well-developed and well-nourished. No distress.  HENT:  Head: Normocephalic and atraumatic.  Right Ear: Hearing,  tympanic membrane, external ear and ear canal normal.  Left Ear: Hearing, tympanic membrane, external ear and ear canal normal.  Nose: Nose normal.  Mouth/Throat: Uvula is midline, oropharynx is clear and moist and mucous membranes are normal. No oropharyngeal exudate, posterior oropharyngeal edema or posterior oropharyngeal erythema.  Eyes: Pupils are equal, round, and reactive to light. Conjunctivae and EOM are normal. No scleral icterus.  Neck: Normal range of motion. Neck supple. No thyromegaly present.  Cardiovascular: Normal rate, regular rhythm, normal heart sounds and  intact distal pulses.   No murmur heard. Pulses:      Radial pulses are 2+ on the right side, and 2+ on the left side.  Pulmonary/Chest: Effort normal and breath sounds normal. No respiratory distress. He has no wheezes. He has no rales.  Abdominal: Soft. Bowel sounds are normal. He exhibits no distension and no mass. There is no tenderness. There is no rebound and no guarding.  Genitourinary: Rectum normal and prostate normal. Rectal exam shows no external hemorrhoid, no internal hemorrhoid, no fissure, no mass, no tenderness and anal tone normal. Prostate is not enlarged (20gm) and not tender.  Musculoskeletal: Normal range of motion. He exhibits no edema.  Lymphadenopathy:    He has no cervical adenopathy.  Neurological: He is alert and oriented to person, place, and time.  CN grossly intact, station and gait intact  Skin: Skin is warm and dry. No rash noted.  Psychiatric: He has a normal mood and affect. His behavior is normal. Judgment and thought content normal.  Nursing note and vitals reviewed.  Results for orders placed or performed in visit on 12/19/16  Lipid panel  Result Value Ref Range   Cholesterol 132 0 - 200 mg/dL   Triglycerides 16.1 0.0 - 149.0 mg/dL   HDL 09.60 (L) >45.40 mg/dL   VLDL 98.1 0.0 - 19.1 mg/dL   LDL Cholesterol 85 0 - 99 mg/dL   Total CHOL/HDL Ratio 4    NonHDL 95.93   Basic metabolic panel  Result Value Ref Range   Sodium 139 135 - 145 mEq/L   Potassium 4.0 3.5 - 5.1 mEq/L   Chloride 106 96 - 112 mEq/L   CO2 26 19 - 32 mEq/L   Glucose, Bld 102 (H) 70 - 99 mg/dL   BUN 18 6 - 23 mg/dL   Creatinine, Ser 4.78 0.40 - 1.50 mg/dL   Calcium 8.6 8.4 - 29.5 mg/dL   GFR 62.13 >08.65 mL/min  TSH  Result Value Ref Range   TSH 2.32 0.35 - 4.50 uIU/mL  CBC with Differential/Platelet  Result Value Ref Range   WBC 4.0 4.0 - 10.5 K/uL   RBC 5.35 4.22 - 5.81 Mil/uL   Hemoglobin 14.4 13.0 - 17.0 g/dL   HCT 78.4 69.6 - 29.5 %   MCV 80.8 78.0 - 100.0 fl    MCHC 33.4 30.0 - 36.0 g/dL   RDW 28.4 13.2 - 44.0 %   Platelets 223.0 150.0 - 400.0 K/uL   Neutrophils Relative % 47.0 43.0 - 77.0 %   Lymphocytes Relative 36.3 12.0 - 46.0 %   Monocytes Relative 11.5 3.0 - 12.0 %   Eosinophils Relative 4.1 0.0 - 5.0 %   Basophils Relative 1.1 0.0 - 3.0 %   Neutro Abs 1.9 1.4 - 7.7 K/uL   Lymphs Abs 1.5 0.7 - 4.0 K/uL   Monocytes Absolute 0.5 0.1 - 1.0 K/uL   Eosinophils Absolute 0.2 0.0 - 0.7 K/uL   Basophils Absolute 0.0 0.0 -  0.1 K/uL      Assessment & Plan:   Problem List Items Addressed This Visit    Dyslipidemia    Chronic, stable off meds. The 10-year ASCVD risk score Denman George DC Montez Hageman., et al., 2013) is: 9.2%   Values used to calculate the score:     Age: 24 years     Sex: Male     Is Non-Hispanic African American: No     Diabetic: No     Tobacco smoker: No     Systolic Blood Pressure: 118 mmHg     Is BP treated: No     HDL Cholesterol: 36.1 mg/dL     Total Cholesterol: 132 mg/dL       Healthcare maintenance - Primary    Preventative protocols reviewed and updated unless pt declined. Discussed healthy diet and lifestyle.  Congratulated on weight loss to date.       PVD (posterior vitreous detachment), left    Followed by ophtho. Reviewed MVI dosing.        Other Visit Diagnoses    Need for influenza vaccination       Relevant Orders   Flu Vaccine QUAD 6+ mos PF IM (Fluarix Quad PF) (Completed)   Special screening for malignant neoplasm of prostate       Relevant Orders   PSA       Follow up plan: Return in about 1 year (around 12/27/2017) for annual exam, prior fasting for blood work.  Eustaquio Boyden, MD

## 2016-12-27 NOTE — Assessment & Plan Note (Signed)
Followed by ophtho. Reviewed MVI dosing.

## 2016-12-27 NOTE — Assessment & Plan Note (Signed)
Chronic, stable off meds. The 10-year ASCVD risk score Terrence Middleton(Terrence DC Montez HagemanJr., Terrence al., Terrence Middleton) is: 9.2%   Values used to calculate the score:     Age: [age] years     Sex: Male     Is Non-Hispanic African American: No     Diabetic: No     Tobacco smoker: No     Systolic Blood Pressure: 118 mmHg     Is BP treated: No     HDL Cholesterol: 36.1 mg/dL     Total Cholesterol: 132 mg/dL

## 2016-12-27 NOTE — Patient Instructions (Addendum)
Flu shot today.  Labs today for PSA.  Good to see you today, call us with questions.  Return as needed or in 1 year for next physical.   Health Maintenance, Male A healthy lifestyle and preventive care is important for your health and wellness. Ask your health care provider about what schedule of regular examinations is right for you. What should I know about weight and diet? Eat a Healthy Diet  Eat plenty of vegetables, fruits, whole grains, low-fat dairy products, and lean protein.  Do not eat a lot of foods high in solid fats, added sugars, or salt.  Maintain a Healthy Weight Regular exercise can help you achieve or maintain a healthy weight. You should:  Do at least 150 minutes of exercise each week. The exercise should increase your heart rate and make you sweat (moderate-intensity exercise).  Do strength-training exercises at least twice a week.  Watch Your Levels of Cholesterol and Blood Lipids  Have your blood tested for lipids and cholesterol every 5 years starting at 64 years of age. If you are at high risk for heart disease, you should start having your blood tested when you are 64 years old. You may need to have your cholesterol levels checked more often if: ? Your lipid or cholesterol levels are high. ? You are older than 64 years of age. ? You are at high risk for heart disease.  What should I know about cancer screening? Many types of cancers can be detected early and may often be prevented. Lung Cancer  You should be screened every year for lung cancer if: ? You are a current smoker who has smoked for at least 30 years. ? You are a former smoker who has quit within the past 15 years.  Talk to your health care provider about your screening options, when you should start screening, and how often you should be screened.  Colorectal Cancer  Routine colorectal cancer screening usually begins at 64 years of age and should be repeated every 5-10 years until you are 64  years old. You may need to be screened more often if early forms of precancerous polyps or small growths are found. Your health care provider may recommend screening at an earlier age if you have risk factors for colon cancer.  Your health care provider may recommend using home test kits to check for hidden blood in the stool.  A small camera at the end of a tube can be used to examine your colon (sigmoidoscopy or colonoscopy). This checks for the earliest forms of colorectal cancer.  Prostate and Testicular Cancer  Depending on your age and overall health, your health care provider may do certain tests to screen for prostate and testicular cancer.  Talk to your health care provider about any symptoms or concerns you have about testicular or prostate cancer.  Skin Cancer  Check your skin from head to toe regularly.  Tell your health care provider about any new moles or changes in moles, especially if: ? There is a change in a mole's size, shape, or color. ? You have a mole that is larger than a pencil eraser.  Always use sunscreen. Apply sunscreen liberally and repeat throughout the day.  Protect yourself by wearing long sleeves, pants, a wide-brimmed hat, and sunglasses when outside.  What should I know about heart disease, diabetes, and high blood pressure?  If you are 6818-64 years of age, have your blood pressure checked every 3-5 years. If you are 40  years of age or older, have your blood pressure checked every year. You should have your blood pressure measured twice-once when you are at a hospital or clinic, and once when you are not at a hospital or clinic. Record the average of the two measurements. To check your blood pressure when you are not at a hospital or clinic, you can use: ? An automated blood pressure machine at a pharmacy. ? A home blood pressure monitor.  Talk to your health care provider about your target blood pressure.  If you are between 18-5 years old, ask your  health care provider if you should take aspirin to prevent heart disease.  Have regular diabetes screenings by checking your fasting blood sugar level. ? If you are at a normal weight and have a low risk for diabetes, have this test once every three years after the age of 32. ? If you are overweight and have a high risk for diabetes, consider being tested at a younger age or more often.  A one-time screening for abdominal aortic aneurysm (AAA) by ultrasound is recommended for men aged 86-75 years who are current or former smokers. What should I know about preventing infection? Hepatitis B If you have a higher risk for hepatitis B, you should be screened for this virus. Talk with your health care provider to find out if you are at risk for hepatitis B infection. Hepatitis C Blood testing is recommended for:  Everyone born from [birth year] through 1963.  Anyone with known risk factors for hepatitis C.  Sexually Transmitted Diseases (STDs)  You should be screened each year for STDs including gonorrhea and chlamydia if: ? You are sexually active and are younger than 64 years of age. ? You are older than 64 years of age and your health care provider tells you that you are at risk for this type of infection. ? Your sexual activity has changed since you were last screened and you are at an increased risk for chlamydia or gonorrhea. Ask your health care provider if you are at risk.  Talk with your health care provider about whether you are at high risk of being infected with HIV. Your health care provider may recommend a prescription medicine to help prevent HIV infection.  What else can I do?  Schedule regular health, dental, and eye exams.  Stay current with your vaccines (immunizations).  Do not use any tobacco products, such as cigarettes, chewing tobacco, and e-cigarettes. If you need help quitting, ask your health care provider.  Limit alcohol intake to no more than 2 drinks per day. One  drink equals 12 ounces of beer, 5 ounces of wine, or 1 ounces of hard liquor.  Do not use street drugs.  Do not share needles.  Ask your health care provider for help if you need support or information about quitting drugs.  Tell your health care provider if you often feel depressed.  Tell your health care provider if you have ever been abused or do not feel safe at home. This information is not intended to replace advice given to you by your health care provider. Make sure you discuss any questions you have with your health care provider. Document Released: 08/20/2007 Document Revised: 10/21/2015 Document Reviewed: 11/25/2014 Elsevier Interactive Patient Education  [employer].

## 2017-02-21 ENCOUNTER — Encounter: Payer: Self-pay | Admitting: Internal Medicine

## 2017-02-21 ENCOUNTER — Ambulatory Visit: Payer: BC Managed Care – PPO | Admitting: Internal Medicine

## 2017-02-21 DIAGNOSIS — Z91038 Other insect allergy status: Secondary | ICD-10-CM | POA: Diagnosis not present

## 2017-02-21 DIAGNOSIS — J449 Chronic obstructive pulmonary disease, unspecified: Secondary | ICD-10-CM | POA: Diagnosis not present

## 2017-02-21 MED ORDER — EPINEPHRINE 0.3 MG/0.3ML IJ SOAJ: INTRAMUSCULAR | 99 refills | Status: DC

## 2017-02-21 MED ORDER — ALBUTEROL SULFATE HFA 108 (90 BASE) MCG/ACT IN AERS: INHALATION_SPRAY | RESPIRATORY_TRACT | 12 refills | Status: DC

## 2017-02-21 MED ORDER — UMECLIDINIUM-VILANTEROL 62.5-25 MCG/INH IN AEPB: INHALATION_SPRAY | RESPIRATORY_TRACT | 1 refills | Status: DC

## 2017-02-21 NOTE — Patient Instructions (Signed)
Refill scripts sent for albuterol rescue inhaler and epi-pen  Script held in computer for Anoro  Please cal if we can help

## 2017-02-21 NOTE — Assessment & Plan Note (Signed)
Current status is mild, intermittent, well controlled, with excellent PFT scores.  He is only needing rescue inhaler very occasionally and has not needed his maintenance controller.  We discussed these medications.

## 2017-02-21 NOTE — Progress Notes (Signed)
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 ------------------------------------------------  02/22/2016-66 year old male never smoker followed for asthma, allergic rhinitis, complicated by GERD, history of insect venom allergy, peripheral venous insufficiency (son died of Hermansky- Pudlak syndrome) FOLLOWS FOR: Pt states his breathing is doing well overall. Pt will need refill for Anoro inhaler-works well for him. He has not needed much antibiotic over the last year once a stretch of recurrent bronchitis ended. Anoro works well. Still notices some dyspnea on exertion running without progression. Little routine cough and no wheeze or sleep disturbance. We discussed pneumonia vaccine strategies. He keeps EpiPen available because of his bee- keeping hobby  02/21/17- 66 year old male never smoker followed for asthma, allergic rhinitis, complicated by GERD, history of insect venom allergy, peripheral venous insufficiency (son died of Hermansky- Pudlak syndrome) ---follow up for asthma, wants to review PFT from last year and treatment Anoro, Pro-air He has lost weight this summer with a lot of physical activity-building a deck.  Had bronchitis episodes in February and March treated by PCP, then another in November.  Feels well today.  Stopped Anoro this summer to see how he would do.  Previously Advair associated with thrush and he wanted to avoid steroid exposure.  Has only needed rescue inhaler 3 times in 6 months.  Denies routine wheeze or any sleep disturbance. We reviewed his PFT PFT 03/04/16-FVC 4.64/101%, FEV1 3.07/89%, ratio 0.66, FEF 25-75%  2.34/85%, TLC 96%, DLCO 92%, insignificant response to dilator.  ROS-see HPI + = positive Constitutional:   No-   weight loss, night sweats, fevers, chills, fatigue, lassitude. HEENT:   No-  headaches, difficulty swallowing, tooth/dental problems, sore throat,       No-  sneezing, itching, ear ache, nasal congestion, post nasal drip,  CV:  No-   chest pain, orthopnea, PND, swelling in lower extremities, anasarca, dizziness, palpitations Resp: +shortness of breath with exertion or at rest.                productive cough,  No non-productive cough,  No- coughing up of blood.               change in color of mucus.  No- wheezing.   Skin: No-   rash or lesions. GI:  No-   heartburn, indigestion, abdominal pain, nausea, vomiting,  GU: MS:  . Neuro-     nothing unusual Psych:  No- change in mood or affect. No depression or anxiety.  No memory loss.  OBJ General- Alert, Oriented, Affect-appropriate, Distress- none acute Skin- + multicolored brown spot right side of neck. Concerning. Lymphadenopathy- none Head- atraumatic            Eyes- Gross vision intact, PERRLA, conjunctivae clear secretions            Ears- Hearing, canals-normal            Nose- Clear, no-Septal dev, mucus, polyps, erosion, perforation             Throat- Mallampati II, drainage- none, tonsils- atrophic.  Neck- flexible , trachea midline, no stridor , thyroid nl, carotid no bruit Chest - symmetrical excursion , unlabored           Heart/CV- RRR , no murmur ,  no gallop  , no rub, nl s1 s2                           - JVD- none , edema-none, stasis changes- none, varices- none           Lung- cough-none, unlabored , dullness-none, rub- none           Chest wall-  Abd-  Br/ Gen/ Rectal- Not done, not indicated Extrem- cyanosis- none, clubbing, none, atrophy- none, strength- nl.  Neuro- grossly intact to observation

## 2017-02-21 NOTE — Assessment & Plan Note (Signed)
He keeps an EpiPen available but has not had no events

## 2017-06-01 ENCOUNTER — Ambulatory Visit: Payer: Medicare Other | Admitting: Internal Medicine

## 2017-06-01 ENCOUNTER — Encounter: Payer: Self-pay | Admitting: Internal Medicine

## 2017-06-01 VITALS — BP 128/82 | HR 60 | Temp 98.3°F | Wt 216.2 lb

## 2017-06-01 DIAGNOSIS — J069 Acute upper respiratory infection, unspecified: Secondary | ICD-10-CM

## 2017-06-01 MED ORDER — HYDROCODONE-HOMATROPINE 5-1.5 MG/5ML PO SYRP: 5.0000 mL | ORAL_SOLUTION | Freq: Three times a day (TID) | ORAL | 0 refills | Status: DC | PRN

## 2017-06-01 MED ORDER — AZITHROMYCIN 250 MG PO TABS: ORAL_TABLET | ORAL | 0 refills | Status: DC

## 2017-06-01 NOTE — Progress Notes (Signed)
HPI  Pt presents to the clinic today with c/o nasal congestion, sore throat and cough. This started 5 days ago. He is not able to blow anything out of his nose. He denies difficulty swallowing. The cough is productive of green mucous. He is wheezing but not short of breath. He denies fever, chills or body aches. He has tried Mucinex DM with some relief. He has had sick contacts. He has a history of allergies and asthma, follows with Dr. Maple Hudson. He takes Anoro Ellipta and Albuterol as prescribed.   Review of Systems      Past Medical History:  Diagnosis Date  . ALLERGIC RHINITIS 02/14/2008   Young MD, Joni Fears D   . Allergy to insect stings 02/10/2011   Hymenoptera sting   . Asthma    exercise induced per patient Maple Hudson)  . GERD (gastroesophageal reflux disease)   . PVD (posterior vitreous detachment), left     Family History  Problem Relation Age of Onset  . Asthma Mother   . COPD Mother   . Emphysema Father   . Lymphoma Father 70  . Crohn's disease Son        Colonectomy  . Pulmonary disease Son 20       passed away  . Colon cancer Neg Hx     Social History   Socioeconomic History  . Marital status: Married    Spouse name: Not on file  . Number of children: 1  . Years of education: Not on file  . Highest education level: Not on file  Occupational History  . Occupation: Art gallery manager for [employer] Needs  . Financial resource strain: Not on file  . Food insecurity:    Worry: Not on file    Inability: Not on file  . Transportation needs:    Medical: Not on file    Non-medical: Not on file  Tobacco Use  . Smoking status: Never Smoker  . Smokeless tobacco: Never Used  Substance and Sexual Activity  . Alcohol use: Yes    Alcohol/week: 3.0 - 3.6 oz    Types: 5 - 6 Cans of beer per week    Comment: occasionally beer  . Drug use: No  . Sexual activity: Not on file  Lifestyle  . Physical activity:    Days per week: Not on file    Minutes per session: Not on file   . Stress: Not on file  Relationships  . Social connections:    Talks on phone: Not on file    Gets together: Not on file    Attends religious service: Not on file    Active member of club or organization: Not on file    Attends meetings of clubs or organizations: Not on file    Relationship status: Not on file  . Intimate partner violence:    Fear of current or ex partner: Not on file    Emotionally abused: Not on file    Physically abused: Not on file    Forced sexual activity: Not on file  Other Topics Concern  . Not on file  Social History Narrative   Lives with wife, dog Manufacturing engineer).  Grown children.   65yo son [name] passed away 07/23/2014 (pulm fibrosis - Hermanske-Pudlike syndrome)   Occupation: Technical sales engineer at [employer]   Activity: no regular exercise.  Does walk at work and stays active at home.   Diet: good water, fruits/vegetables daily    Allergies  Allergen Reactions  .  Sulfonamide Derivatives Other (See Comments)    Pt states he has stomach pain with these drugs     Constitutional:  Denies headache, fatigue, fever or abrupt weight changes.  HEENT:  Positive nasal congestion, sore throat. Denies eye redness, eye pain, pressure behind the eyes, facial pain, ear pain, ringing in the ears, wax buildup, runny nose or bloody nose. Respiratory: Positive cough. Denies difficulty breathing or shortness of breath.  Cardiovascular: Denies chest pain, chest tightness, palpitations or swelling in the hands or feet.   No other specific complaints in a complete review of systems (except as listed in HPI above).  Objective:   BP 128/82 (BP Location: Right Arm, Patient Position: Sitting, Cuff Size: Large)   Pulse 60   Temp 98.3 F (36.8 C) (Oral)   Wt 216 lb 4 oz (98.1 kg)   SpO2 96%   BMI 30.59 kg/m   Wt Readings from Last 3 Encounters:  06/01/17 216 lb 4 oz (98.1 kg)  02/21/17 201 lb 6.4 oz (91.4 kg)  12/27/16 199 lb 12 oz (90.6 kg)     General:  Appears his stated age, in NAD. HEENT: Head: normal shape and size, no sinus tenderness noted; Ears: Tm's gray and intact, normal light reflex; Nose: mucosa pink and moist, septum midline; Throat/Mouth: + PND. Teeth present, mucosa erythematous and moist, no exudate noted, no lesions or ulcerations noted.  Neck: No cervical lymphadenopathy.  Cardiovascular: Normal rate and rhythm. S1,S2 noted.  No murmur, rubs or gallops noted.  Pulmonary/Chest: Normal effort and positive vesicular breath sounds. No respiratory distress. No wheezes, rales or ronchi noted.       Assessment & Plan:   Upper Respiratory Infection:  Get some rest and drink plenty of water Do salt water gargles for the sore throat eRx for Azithromax x 5 days eRx for Hycodan cough syrup  RTC as needed or if symptoms persist.   Nicki ReaperBAITY, Jahan Friedlander, NP

## 2017-06-01 NOTE — Patient Instructions (Signed)
Upper Respiratory Infection, Adult Most upper respiratory infections (URIs) are caused by a virus. A URI affects the nose, throat, and upper air passages. The most common type of URI is often called "the common cold." Follow these instructions at home:  Take medicines only as told by your doctor.  Gargle warm saltwater or take cough drops to comfort your throat as told by your doctor.  Use a warm mist humidifier or inhale steam from a shower to increase air moisture. This may make it easier to breathe.  Drink enough fluid to keep your pee (urine) clear or pale yellow.  Eat soups and other clear broths.  Have a healthy diet.  Rest as needed.  Go back to work when your fever is gone or your doctor says it is okay. ? You may need to stay home longer to avoid giving your URI to others. ? You can also wear a face mask and wash your hands often to prevent spread of the virus.  Use your inhaler more if you have asthma.  Do not use any tobacco products, including cigarettes, chewing tobacco, or electronic cigarettes. If you need help quitting, ask your doctor. Contact a doctor if:  You are getting worse, not better.  Your symptoms are not helped by medicine.  You have chills.  You are getting more short of breath.  You have brown or red mucus.  You have yellow or brown discharge from your nose.  You have pain in your face, especially when you bend forward.  You have a fever.  You have puffy (swollen) neck glands.  You have pain while swallowing.  You have white areas in the back of your throat. Get help right away if:  You have very bad or constant: ? Headache. ? Ear pain. ? Pain in your forehead, behind your eyes, and over your cheekbones (sinus pain). ? Chest pain.  You have long-lasting (chronic) lung disease and any of the following: ? Wheezing. ? Long-lasting cough. ? Coughing up blood. ? A change in your usual mucus.  You have a stiff neck.  You have  changes in your: ? Vision. ? Hearing. ? Thinking. ? Mood. This information is not intended to replace advice given to you by your health care provider. Make sure you discuss any questions you have with your health care provider. Document Released: 08/10/2007 Document Revised: 10/25/2015 Document Reviewed: 05/29/2013 Elsevier Interactive Patient Education  2018 [employer]  

## 2017-12-24 ENCOUNTER — Other Ambulatory Visit: Payer: Self-pay | Admitting: Family Medicine

## 2017-12-24 DIAGNOSIS — Z125 Encounter for screening for malignant neoplasm of prostate: Secondary | ICD-10-CM

## 2017-12-24 DIAGNOSIS — E785 Hyperlipidemia, unspecified: Secondary | ICD-10-CM

## 2017-12-25 ENCOUNTER — Other Ambulatory Visit (INDEPENDENT_AMBULATORY_CARE_PROVIDER_SITE_OTHER): Payer: Medicare Other

## 2017-12-25 DIAGNOSIS — Z125 Encounter for screening for malignant neoplasm of prostate: Secondary | ICD-10-CM | POA: Diagnosis not present

## 2017-12-25 DIAGNOSIS — E785 Hyperlipidemia, unspecified: Secondary | ICD-10-CM | POA: Diagnosis not present

## 2017-12-25 LAB — COMPREHENSIVE METABOLIC PANEL
ALBUMIN: 3.9 g/dL (ref 3.5–5.2)
ALT: 12 U/L (ref 0–53)
AST: 9 U/L (ref 0–37)
Alkaline Phosphatase: 39 U/L (ref 39–117)
BUN: 21 mg/dL (ref 6–23)
CALCIUM: 8.7 mg/dL (ref 8.4–10.5)
CHLORIDE: 105 meq/L (ref 96–112)
CO2: 27 mEq/L (ref 19–32)
CREATININE: 1.02 mg/dL (ref 0.40–1.50)
GFR: 77.77 mL/min (ref >60.00)
Glucose, Bld: 108 mg/dL — ABNORMAL HIGH (ref 70–99)
Potassium: 4.1 mEq/L (ref 3.5–5.1)
Sodium: 139 mEq/L (ref 135–145)
Total Bilirubin: 0.3 mg/dL (ref 0.2–1.2)
Total Protein: 6.3 g/dL (ref 6.0–8.3)

## 2017-12-25 LAB — LIPID PANEL
CHOLESTEROL: 143 mg/dL (ref 0–200)
HDL: 36.6 mg/dL — AB (ref >39.00)
LDL Cholesterol: 95 mg/dL (ref 0–99)
NonHDL: 106.77
TRIGLYCERIDES: 59 mg/dL (ref 0.0–149.0)
Total CHOL/HDL Ratio: 4
VLDL: 11.8 mg/dL (ref 0.0–40.0)

## 2017-12-25 LAB — PSA: PSA: 1.44 ng/mL (ref 0.10–4.00)

## 2017-12-25 LAB — TSH: TSH: 2.72 u[IU]/mL (ref 0.35–4.50)

## 2018-01-01 ENCOUNTER — Encounter: Payer: BC Managed Care – PPO | Admitting: Family Medicine

## 2018-01-18 NOTE — Assessment & Plan Note (Addendum)

## 2018-01-18 NOTE — Progress Notes (Addendum)
BP 118/70 (BP Location: Left Arm, Patient Position: Sitting, Cuff Size: Normal)   Pulse 67   Temp 98.4 F (36.9 C) (Oral)   Ht 5' 9.5" (1.765 m)   Wt 203 lb (92.1 kg)   SpO2 96%   BMI 29.55 kg/m    CC: CPE Subjective:    Patient ID: Terrence Middleton, male    DOB: [birth year], 67 y.o.   MRN: [id number]  HPI: Terrence Middleton is a 67 y.o. male presenting on 01/19/2018 for Welcome to Medicare   Asthmatic bronchitis seen by Dr Maple Hudson - on Anoro and albuterol PRN. Has not recently needed Anoro - asks for me to refill this medication.    Hearing Screening   125Hz  250Hz  500Hz  1000Hz  2000Hz  3000Hz  4000Hz  6000Hz  8000Hz   Right ear:   20 20 20  20     Left ear:   20 20 20  20       Visual Acuity Screening   Right eye Left eye Both eyes  Without correction:     With correction: 20/20 20/30 20/15   Comments: Has cataract in left eye Passes fall and depression screens  Preventative: COLONOSCOPY Date: 11/2013 WNL good for 10 yrs Juanda Chance) Prostate screening - pt would like to continue DRE/PSA Flu shot yearly Tetanus Aug 01, 2005. Tdap 08/02/2015 Pneumovax - 02/2015, prevnar 08-02-2015 zostavax - 11/2012 shingrix - discussed  Advanced directives discussion: has all this at home. Wife is HCPOA.  Seat belt use discussed.  Sunscreen use discussed. No changing moles on skin. Sees Dr Yetta Barre had BCC removed.  Ex smoker  Alcohol - 1 light beer/day  Dentist q6 mo Eye exam yearly  Lives with wife, dog Manufacturing engineer). Grown children. 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 Does walk at work and stays active at home.  Diet: good water, fruits/vegetables daily  Relevant past medical, surgical, family and social history reviewed and updated as indicated. Interim medical history since our last visit reviewed. Allergies and medications reviewed and updated. Outpatient Medications  Prior to Visit  Medication Sig Dispense Refill  . albuterol (PROAIR HFA) 108 (90 Base) MCG/ACT inhaler INHALE 2 PUFFS INTO THE LUNGS EVERY 4 (FOUR) HOURS AS NEEDED FOR WHEEZING OR SHORTNESS OF BREATH. 8.5 Inhaler 12  . EPINEPHrine 0.3 mg/0.3 mL IJ SOAJ injection Inject into thigh for severe allergic reaction 1 Device prn  . famotidine (PEPCID) 20 MG tablet Take 20 mg by mouth daily.    . Multiple Vitamins-Minerals (MULTIVITAMIN MEN PO) Take 1 tablet by mouth daily.    . Multiple Vitamins-Minerals (VISION PLUS PO) Take 2 tablets by mouth daily.    . Omega-3 Fatty Acids (OMEGA 3 PO) Take 2 tablets by mouth daily.    . Sodium Chloride-Sodium Bicarb (AYR SALINE NASAL RINSE) 1.57 G PACK Place into the nose. Uses every morning    . umeclidinium-vilanterol (ANORO ELLIPTA) 62.5-25 MCG/INH AEPB INHALE 1 PUFF INTO THE LUNGS DAILY. 180 each 1  . azithromycin (ZITHROMAX) 250 MG tablet Take 2 tabs today, then 1 tab daily x 4 days 6 tablet 0  . HYDROcodone-homatropine (HYCODAN) 5-1.5 MG/5ML syrup Take 5 mLs by mouth every 8 (eight) hours as needed for cough. 120 mL 0   No facility-administered medications prior to visit.      Per HPI unless specifically indicated in ROS section below Review of Systems     Objective:    BP 118/70 (BP  Location: Left Arm, Patient Position: Sitting, Cuff Size: Normal)   Pulse 67   Temp 98.4 F (36.9 C) (Oral)   Ht 5' 9.5" (1.765 m)   Wt 203 lb (92.1 kg)   SpO2 96%   BMI 29.55 kg/m   Wt Readings from Last 3 Encounters:  01/19/18 203 lb (92.1 kg)  06/01/17 216 lb 4 oz (98.1 kg)  02/21/17 201 lb 6.4 oz (91.4 kg)    Physical Exam  Constitutional: He is oriented to person, place, and time. He appears well-developed and well-nourished. No distress.  HENT:  Head: Normocephalic and atraumatic.  Right Ear: Hearing, tympanic membrane, external ear and ear canal normal.  Left Ear: Hearing, tympanic membrane, external ear and ear canal normal.  Nose: Nose normal.    Mouth/Throat: Uvula is midline, oropharynx is clear and moist and mucous membranes are normal. No oropharyngeal exudate, posterior oropharyngeal edema or posterior oropharyngeal erythema.  Eyes: Pupils are equal, round, and reactive to light. Conjunctivae and EOM are normal. No scleral icterus.  Neck: Normal range of motion. Neck supple. Carotid bruit is not present. No thyromegaly present.  Cardiovascular: Normal rate, regular rhythm, normal heart sounds and intact distal pulses.  No murmur heard. Pulses:      Radial pulses are 2+ on the right side, and 2+ on the left side.  Pulmonary/Chest: Effort normal and breath sounds normal. No respiratory distress. He has no wheezes. He has no rales.  Abdominal: Soft. Bowel sounds are normal. He exhibits no distension and no mass. There is no tenderness. There is no rebound and no guarding.  Genitourinary: Rectum normal and prostate normal. Rectal exam shows no external hemorrhoid, no internal hemorrhoid, no fissure, no mass and no tenderness. Prostate is not enlarged (20gm) and not tender.  Musculoskeletal: Normal range of motion. He exhibits no edema.  Lymphadenopathy:    He has no cervical adenopathy.  Neurological: He is alert and oriented to person, place, and time.  CN grossly intact, station and gait intact  Skin: Skin is warm and dry. No rash noted.  Psychiatric: He has a normal mood and affect. His behavior is normal. Judgment and thought content normal.  Nursing note and vitals reviewed.  Results for orders placed or performed in visit on 01/19/18  POCT glycosylated hemoglobin (Hb A1C)  Result Value Ref Range   Hemoglobin A1C 5.4 4.0 - 5.6 %   HbA1c POC (<> result, manual entry)     HbA1c, POC (prediabetic range)     HbA1c, POC (controlled diabetic range)     EKG NSR rate 60, normal axis, intervals, no acute ST/T changes    Assessment & Plan:   Problem List Items Addressed This Visit    Welcome to Medicare preventive visit -  Primary    I have personally reviewed the Medicare Annual Wellness questionnaire and have noted 1. The patient's medical and social history 2. Their use of alcohol, tobacco or illicit drugs 3. Their current medications and supplements 4. The patient's functional ability including ADL's, fall risks, home safety risks and hearing or visual impairment. Cognitive function has been assessed and addressed as indicated.  5. Diet and physical activity 6. Evidence for depression or mood disorders The patients weight, height, BMI have been recorded in the chart. I have made referrals, counseling and provided education to the patient based on review of the above and I have provided the pt with a written personalized care plan for preventive services. Provider list updated.. See scanned questionairre as  needed for further documentation. Reviewed preventative protocols and updated unless pt declined.       Relevant Orders   EKG 12-Lead (Completed)   Dyslipidemia    Chronic, off medications. He does take fish oil supplement daily.  The 10-year ASCVD risk score Denman George DC Montez Hageman., et al., 2013) is: 10.5%   Values used to calculate the score:     Age: 73 years     Sex: Male     Is Non-Hispanic African American: No     Diabetic: No     Tobacco smoker: No     Systolic Blood Pressure: 118 mmHg     Is BP treated: No     HDL Cholesterol: 36.6 mg/dL     Total Cholesterol: 143 mg/dL       Asthmatic bronchitis , chronic (HCC)   Advanced care planning/counseling discussion    Advanced directives discussion: has all this at home. Wife is HCPOA.        Other Visit Diagnoses    Need for influenza vaccination       Relevant Orders   Flu Vaccine QUAD 36+ mos IM (Completed)   Hyperglycemia       Relevant Orders   POCT glycosylated hemoglobin (Hb A1C) (Completed)       No orders of the defined types were placed in this encounter.  Orders Placed This Encounter  Procedures  . Flu Vaccine QUAD 36+ mos IM   . POCT glycosylated hemoglobin (Hb A1C)  . EKG 12-Lead    Follow up plan: Return in about 1 year (around 01/20/2019) for annual exam, prior fasting for blood work, medicare wellness visit.  Eustaquio Boyden, MD

## 2018-01-19 ENCOUNTER — Encounter: Payer: Self-pay | Admitting: Family Medicine

## 2018-01-19 ENCOUNTER — Ambulatory Visit (INDEPENDENT_AMBULATORY_CARE_PROVIDER_SITE_OTHER): Payer: Medicare Other | Admitting: Family Medicine

## 2018-01-19 VITALS — BP 118/70 | HR 67 | Temp 98.4°F | Ht 69.5 in | Wt 203.0 lb

## 2018-01-19 DIAGNOSIS — R7309 Other abnormal glucose: Secondary | ICD-10-CM

## 2018-01-19 DIAGNOSIS — J449 Chronic obstructive pulmonary disease, unspecified: Secondary | ICD-10-CM

## 2018-01-19 DIAGNOSIS — Z0000 Encounter for general adult medical examination without abnormal findings: Secondary | ICD-10-CM

## 2018-01-19 DIAGNOSIS — Z7189 Other specified counseling: Secondary | ICD-10-CM | POA: Insufficient documentation

## 2018-01-19 DIAGNOSIS — R739 Hyperglycemia, unspecified: Secondary | ICD-10-CM

## 2018-01-19 DIAGNOSIS — E785 Hyperlipidemia, unspecified: Secondary | ICD-10-CM

## 2018-01-19 DIAGNOSIS — Z23 Encounter for immunization: Secondary | ICD-10-CM

## 2018-01-19 LAB — POCT GLYCOSYLATED HEMOGLOBIN (HGB A1C): HEMOGLOBIN A1C: 5.4 % (ref 4.0–5.6)

## 2018-01-19 NOTE — Patient Instructions (Addendum)
Flu shot today If interested, check with pharmacy about new 2 shot shingles series (shingrix).  Check A1c today.  You are doing well today  Return as needed or in 1 year for next wellness visit  Health Maintenance, Male A healthy lifestyle and preventive care is important for your health and wellness. Ask your health care provider about what schedule of regular examinations is right for you. What should I know about weight and diet? Eat a Healthy Diet  Eat plenty of vegetables, fruits, whole grains, low-fat dairy products, and lean protein.  Do not eat a lot of foods high in solid fats, added sugars, or salt.  Maintain a Healthy Weight Regular exercise can help you achieve or maintain a healthy weight. You should:  Do at least 150 minutes of exercise each week. The exercise should increase your heart rate and make you sweat (moderate-intensity exercise).  Do strength-training exercises at least twice a week.  Watch Your Levels of Cholesterol and Blood Lipids  Have your blood tested for lipids and cholesterol every 5 years starting at 65 years of age. If you are at high risk for heart disease, you should start having your blood tested when you are 65 years old. You may need to have your cholesterol levels checked more often if: ? Your lipid or cholesterol levels are high. ? You are older than 65 years of age. ? You are at high risk for heart disease.  What should I know about cancer screening? Many types of cancers can be detected early and may often be prevented. Lung Cancer  You should be screened every year for lung cancer if: ? You are a current smoker who has smoked for at least 30 years. ? You are a former smoker who has quit within the past 15 years.  Talk to your health care provider about your screening options, when you should start screening, and how often you should be screened.  Colorectal Cancer  Routine colorectal cancer screening usually begins at 65 years of  age and should be repeated every 5-10 years until you are 65 years old. You may need to be screened more often if early forms of precancerous polyps or small growths are found. Your health care provider may recommend screening at an earlier age if you have risk factors for colon cancer.  Your health care provider may recommend using home test kits to check for hidden blood in the stool.  A small camera at the end of a tube can be used to examine your colon (sigmoidoscopy or colonoscopy). This checks for the earliest forms of colorectal cancer.  Prostate and Testicular Cancer  Depending on your age and overall health, your health care provider may do certain tests to screen for prostate and testicular cancer.  Talk to your health care provider about any symptoms or concerns you have about testicular or prostate cancer.  Skin Cancer  Check your skin from head to toe regularly.  Tell your health care provider about any new moles or changes in moles, especially if: ? There is a change in a mole's size, shape, or color. ? You have a mole that is larger than a pencil eraser.  Always use sunscreen. Apply sunscreen liberally and repeat throughout the day.  Protect yourself by wearing long sleeves, pants, a wide-brimmed hat, and sunglasses when outside.  What should I know about heart disease, diabetes, and high blood pressure?  If you are 6218-65 years of age, have your blood pressure checked  every 3-5 years. If you are 39 years of age or older, have your blood pressure checked every year. You should have your blood pressure measured twice-once when you are at a hospital or clinic, and once when you are not at a hospital or clinic. Record the average of the two measurements. To check your blood pressure when you are not at a hospital or clinic, you can use: ? An automated blood pressure machine at a pharmacy. ? A home blood pressure monitor.  Talk to your health care provider about your target  blood pressure.  If you are between 60-12 years old, ask your health care provider if you should take aspirin to prevent heart disease.  Have regular diabetes screenings by checking your fasting blood sugar level. ? If you are at a normal weight and have a low risk for diabetes, have this test once every three years after the age of 70. ? If you are overweight and have a high risk for diabetes, consider being tested at a younger age or more often.  A one-time screening for abdominal aortic aneurysm (AAA) by ultrasound is recommended for men aged 75-75 years who are current or former smokers. What should I know about preventing infection? Hepatitis B If you have a higher risk for hepatitis B, you should be screened for this virus. Talk with your health care provider to find out if you are at risk for hepatitis B infection. Hepatitis C Blood testing is recommended for:  Everyone born from [birth year] through 1963.  Anyone with known risk factors for hepatitis C.  Sexually Transmitted Diseases (STDs)  You should be screened each year for STDs including gonorrhea and chlamydia if: ? You are sexually active and are younger than 66 years of age. ? You are older than 65 years of age and your health care provider tells you that you are at risk for this type of infection. ? Your sexual activity has changed since you were last screened and you are at an increased risk for chlamydia or gonorrhea. Ask your health care provider if you are at risk.  Talk with your health care provider about whether you are at high risk of being infected with HIV. Your health care provider may recommend a prescription medicine to help prevent HIV infection.  What else can I do?  Schedule regular health, dental, and eye exams.  Stay current with your vaccines (immunizations).  Do not use any tobacco products, such as cigarettes, chewing tobacco, and e-cigarettes. If you need help quitting, ask your health care  provider.  Limit alcohol intake to no more than 2 drinks per day. One drink equals 12 ounces of beer, 5 ounces of wine, or 1 ounces of hard liquor.  Do not use street drugs.  Do not share needles.  Ask your health care provider for help if you need support or information about quitting drugs.  Tell your health care provider if you often feel depressed.  Tell your health care provider if you have ever been abused or do not feel safe at home. This information is not intended to replace advice given to you by your health care provider. Make sure you discuss any questions you have with your health care provider. Document Released: 08/20/2007 Document Revised: 10/21/2015 Document Reviewed: 11/25/2014 Elsevier Interactive Patient Education  [employer].

## 2018-01-19 NOTE — Assessment & Plan Note (Signed)
Chronic, off medications. He does take fish oil supplement daily.  The 10-year ASCVD risk score Denman George(Goff DC Montez HagemanJr., et al., 2013) is: 10.5%   Values used to calculate the score:     Age: [age] years     Sex: Male     Is Non-Hispanic African American: No     Diabetic: No     Tobacco smoker: No     Systolic Blood Pressure: 118 mmHg     Is BP treated: No     HDL Cholesterol: 36.6 mg/dL     Total Cholesterol: 143 mg/dL

## 2018-01-19 NOTE — Addendum Note (Signed)
Addended by: Alvina ChouWALSH, Riven Mabile J on: 01/19/2018 03:37 PM   Modules accepted: Orders

## 2018-01-19 NOTE — Assessment & Plan Note (Signed)
Advanced directives discussion: has all this at home. Wife is HCPOA.

## 2018-02-21 ENCOUNTER — Encounter: Payer: Self-pay | Admitting: Internal Medicine

## 2018-02-21 ENCOUNTER — Ambulatory Visit: Payer: Medicare Other | Admitting: Internal Medicine

## 2018-02-21 ENCOUNTER — Telehealth: Payer: Self-pay | Admitting: Internal Medicine

## 2018-02-21 VITALS — BP 112/72 | HR 62 | Ht 70.5 in | Wt 212.0 lb

## 2018-02-21 DIAGNOSIS — J449 Chronic obstructive pulmonary disease, unspecified: Secondary | ICD-10-CM

## 2018-02-21 DIAGNOSIS — Z91038 Other insect allergy status: Secondary | ICD-10-CM | POA: Diagnosis not present

## 2018-02-21 MED ORDER — EPINEPHRINE 0.3 MG/0.3ML IJ SOAJ: INTRAMUSCULAR | 99 refills | Status: DC

## 2018-02-21 MED ORDER — UMECLIDINIUM-VILANTEROL 62.5-25 MCG/INH IN AEPB: INHALATION_SPRAY | RESPIRATORY_TRACT | 12 refills | Status: DC

## 2018-02-21 MED ORDER — ALBUTEROL SULFATE HFA 108 (90 BASE) MCG/ACT IN AERS: INHALATION_SPRAY | RESPIRATORY_TRACT | 12 refills | Status: DC

## 2018-02-21 NOTE — Telephone Encounter (Signed)
Left message informing patient that we cannot email his office visit notes but we would mail them. Notes printed and placed upfront for outgoing mail. Nothing further is needed at this time.

## 2018-02-21 NOTE — Progress Notes (Signed)
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 ------------------------------------------------  02/21/17- [age] year old male never smoker followed for asthma, allergic rhinitis, complicated by GERD, history of insect venom allergy, peripheral venous insufficiency (son died of Hermansky- Pudlak syndrome) ---follow up for asthma, wants to review PFT from last year and treatment Anoro, Pro-air He has lost weight this summer with a lot of physical activity-building a deck.  Had bronchitis episodes in February and March treated by PCP, then another in November.  Feels well today.  Stopped Anoro this summer to see how he would do.  Previously Advair associated with thrush and he wanted to avoid steroid exposure.  Has only needed rescue inhaler 3 times in 6 months.  Denies routine wheeze or any sleep disturbance. We reviewed his PFT PFT 03/04/16-FVC 4.64/101%, FEV1 3.07/89%, ratio 0.66, FEF 25-75% 2.34/85%, TLC 96%, DLCO 92%, insignificant response to dilator.  02/21/2018- 67 year old male never smoker followed for asthma, allergic rhinitis, complicated by GERD, history of insect venom allergy, peripheral venous insufficiency (son died of Hermansky- Pudlak syndrome) -----Doing well at this time he has had a URI at the start of the year but nothing since. Proair hfa, EpiPen (beekeeper), Anoro To mild bronchitic flareups in the last year were treated by his primary physician successfully.  Otherwise he says he is had a very good year with rare need for rescue inhaler.  He uses Anoro for intervals when he thinks he needs it, but usually feels clear.  ROS-see HPI  + = positive Constitutional:   No-   weight loss, night sweats, fevers, chills, fatigue, lassitude. HEENT:   No-  headaches, difficulty swallowing, tooth/dental problems, sore throat,       No-  sneezing, itching, ear ache, nasal congestion, post nasal drip,  CV:  No-   chest pain, orthopnea, PND, swelling in lower extremities, anasarca, dizziness, palpitations Resp: +shortness of breath with exertion or at rest.                productive cough,  No non-productive cough,  No- coughing up of blood.               change in color of mucus.  No- wheezing.   Skin: No-   rash or lesions. GI:  No-   heartburn, indigestion, abdominal pain, nausea, vomiting,  GU: MS:  . Neuro-     nothing unusual Psych:  No- change in mood or affect. No depression or anxiety.  No memory loss.  OBJ General- Alert, Oriented, Affect-appropriate, Distress- none acute Skin-  Lymphadenopathy- none Head- atraumatic            Eyes- Gross vision intact, PERRLA, conjunctivae clear secretions            Ears- Hearing, canals-normal            Nose- Clear, no-Septal dev, mucus, polyps, erosion, perforation             Throat- Mallampati II, drainage- none, tonsils- atrophic.  Neck- flexible , trachea midline, no stridor , thyroid nl, carotid no bruit Chest - symmetrical excursion , unlabored           Heart/CV- RRR , no murmur , no gallop  , no rub, nl  s1 s2                           - JVD- none , edema-none, stasis changes- none, varices- none           Lung- cough-none, unlabored , dullness-none, rub- none           Chest wall-  Abd-  Br/ Gen/ Rectal- Not done, not indicated Extrem- cyanosis- none, clubbing, none, atrophy- none, strength- nl.  Neuro- grossly intact to observation

## 2018-02-21 NOTE — Patient Instructions (Signed)
We printed refill scripts  Please call if we can help

## 2018-03-07 NOTE — Assessment & Plan Note (Signed)
Moderate persistent, uncomplicated and well controlled usually.  2 breakthrough episodes of acute bronchitis were described.  Anoro has been a successful maintenance controller.

## 2018-03-07 NOTE — Assessment & Plan Note (Signed)
He keeps an EpiPen which he has not needed to use.  No concerning episodes.

## 2018-04-25 ENCOUNTER — Encounter: Payer: Self-pay | Admitting: Family Medicine

## 2018-05-22 ENCOUNTER — Other Ambulatory Visit: Payer: Self-pay

## 2018-05-22 ENCOUNTER — Ambulatory Visit: Payer: Medicare Other | Admitting: Family Medicine

## 2018-05-22 ENCOUNTER — Encounter: Payer: Self-pay | Admitting: Family Medicine

## 2018-05-22 ENCOUNTER — Telehealth: Payer: Self-pay

## 2018-05-22 VITALS — BP 118/70 | HR 79 | Temp 98.6°F | Ht 69.5 in | Wt 204.0 lb

## 2018-05-22 DIAGNOSIS — M545 Low back pain, unspecified: Secondary | ICD-10-CM

## 2018-05-22 DIAGNOSIS — J019 Acute sinusitis, unspecified: Secondary | ICD-10-CM | POA: Diagnosis not present

## 2018-05-22 DIAGNOSIS — R509 Fever, unspecified: Secondary | ICD-10-CM

## 2018-05-22 LAB — POC INFLUENZA A&B (BINAX/QUICKVUE)
Influenza A, POC: NEGATIVE
Influenza B, POC: NEGATIVE

## 2018-05-22 MED ORDER — AMOXICILLIN-POT CLAVULANATE 875-125 MG PO TABS: 1.0000 | ORAL_TABLET | Freq: Two times a day (BID) | ORAL | 0 refills | Status: AC

## 2018-05-22 MED ORDER — HYDROCODONE-HOMATROPINE 5-1.5 MG/5ML PO SYRP: 5.0000 mL | ORAL_SOLUTION | Freq: Three times a day (TID) | ORAL | 0 refills | Status: DC | PRN

## 2018-05-22 NOTE — Progress Notes (Addendum)
BP 118/70 (BP Location: Left Arm, Patient Position: Sitting, Cuff Size: Normal)   Pulse 79   Temp 98.6 F (37 C) (Oral)   Ht 5' 9.5" (1.765 m)   Wt 204 lb (92.5 kg)   SpO2 96%   BMI 29.69 kg/m    CC: cough, fever Subjective:    Patient ID: Terrence Middleton, male    DOB: 1951-02-23, 68 y.o.   MRN: [id number]  HPI: Terrence Middleton is a 68 y.o. male presenting on 05/22/2018 for Cough (C/o cough, wheezing, diarrhea, fever-max 103. Sxs started 05/14/18. Tried ibuprofen, guaifenesin-dextromorphan and Anoro Ellipta. )   9d h/o fever Tmax 103, mildly productive cough of white mucous, HA, no appetite. Intermittently persistent fever. Watery diarrhea last week that has since improved. Persistent nagging cough and fatigue, fever returned last night to 100.5. Diarrhea has returned this morning as well. Some initial body aches, wheezing and ST now better. Overall he is improving but symptoms and fatigue linger. Some PNdrainage ?allergies.   Treating at home with ibuprofen. Continues guaifenesin- dextromethorphan use without benefit. Has been using hycodan cough syrup at night time. Tried some antihistamine with some benefit. Continues nasal saline rinse.  Has also tried anoro ellipta, albuterol inh.   No nausea, vomiting, ear or tooth pain, HA, sinus pressure, dyspnea.   Grand daughter sick recently - with sinusitis treated with amoxicillin.  H/o exercise induced asthma. Known allergic rhinitis.   Non smoker  Ongoing R lower back pain that can wake him up - improved with passing gas. Appendix remains. Has had some constipation recently. Off and on for several months. Has been told has scoliosis.   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      Relevant past medical, surgical, family and social history reviewed and updated as indicated. Interim medical history since our last visit reviewed. Allergies and medications reviewed and updated. Outpatient Medications  Prior to Visit  Medication Sig Dispense Refill  . EPINEPHrine 0.3 mg/0.3 mL IJ SOAJ injection Inject into thigh for severe allergic reaction 1 Device prn  . famotidine (PEPCID) 20 MG tablet Take 20 mg by mouth daily.    . Multiple Vitamins-Minerals (MULTIVITAMIN MEN PO) Take 1 tablet by mouth daily.    . Multiple Vitamins-Minerals (VISION PLUS PO) Take 2 tablets by mouth daily.    . Omega-3 Fatty Acids (OMEGA 3 PO) Take 2 tablets by mouth daily.    . Sodium Chloride-Sodium Bicarb (AYR SALINE NASAL RINSE) 1.57 G PACK Place into the nose. Uses every morning    . umeclidinium-vilanterol (ANORO ELLIPTA) 62.5-25 MCG/INH AEPB INHALE 1 PUFF INTO THE LUNGS DAILY. 180 each 12  . albuterol (PROAIR HFA) 108 (90 Base) MCG/ACT inhaler INHALE 2 PUFFS INTO THE LUNGS EVERY 4 (FOUR) HOURS AS NEEDED FOR WHEEZING OR SHORTNESS OF BREATH. (Patient not taking: Reported on 05/22/2018) 8.5 Inhaler 12   No facility-administered medications prior to visit.      Per HPI unless specifically indicated in ROS section below Review of Systems Objective:    BP 118/70 (BP Location: Left Arm, Patient Position: Sitting, Cuff Size: Normal)   Pulse 79   Temp 98.6 F (37 C) (Oral)   Ht 5' 9.5" (1.765 m)   Wt 204 lb (92.5 kg)   SpO2 96%   BMI 29.69 kg/m   Wt Readings from Last 3 Encounters:  05/22/18 204 lb (92.5 kg)  02/21/18 212 lb (96.2 kg)  01/19/18 203 lb (92.1 kg)  Physical Exam Vitals signs and nursing note reviewed.  Constitutional:      General: He is not in acute distress.    Appearance: Normal appearance. He is well-developed.  HENT:     Head: Normocephalic and atraumatic.     Right Ear: Hearing, tympanic membrane, ear canal and external ear normal.     Left Ear: Hearing, tympanic membrane, ear canal and external ear normal.     Nose: Mucosal edema (and erythema) and congestion present. No rhinorrhea.     Right Sinus: No maxillary sinus tenderness or frontal sinus tenderness.     Left Sinus: No  maxillary sinus tenderness or frontal sinus tenderness.     Mouth/Throat:     Mouth: Mucous membranes are moist.     Pharynx: Uvula midline. No oropharyngeal exudate or posterior oropharyngeal erythema.     Tonsils: No tonsillar abscesses.  Eyes:     General: No scleral icterus.    Conjunctiva/sclera: Conjunctivae normal.     Pupils: Pupils are equal, round, and reactive to light.  Neck:     Musculoskeletal: Normal range of motion and neck supple.  Cardiovascular:     Rate and Rhythm: Normal rate and regular rhythm.     Pulses: Normal pulses.     Heart sounds: Normal heart sounds. No murmur.  Pulmonary:     Effort: Pulmonary effort is normal. No respiratory distress.     Breath sounds: Normal breath sounds. No wheezing, rhonchi or rales.  Musculoskeletal: Normal range of motion.        General: Tenderness present.     Comments: No pain midline spine No paraspinous mm tenderness Neg SLR bilaterally. No pain with int/ext rotation at hip. Neg FABER. No pain at GTB or sciatic notch bilaterally.  Tender to palpation at R SIJ  Lymphadenopathy:     Cervical: No cervical adenopathy.  Skin:    General: Skin is warm and dry.     Findings: No rash.  Neurological:     Mental Status: He is alert.       Results for orders placed or performed in visit on 05/22/18  POC Influenza A&B(BINAX/QUICKVUE)  Result Value Ref Range   Influenza A, POC Negative Negative   Influenza B, POC Negative Negative   Assessment & Plan:   Problem List Items Addressed This Visit    Right low back pain    Suspicious for sacroiliitis. rec naprosyn PRN. Update if worsening.       Acute sinusitis - Primary    Anticipate bacterial sinusitis given duration and progression of symptoms. Lungs clear. Supportive care reviewed as per instructions. Update if not improving with treatment. Pt agrees with plan.  Diarrhea may have been caused by guaifenesin in extended mucinex he's been taking scheduled - suggested he  back off this.       Relevant Medications   amoxicillin-clavulanate (AUGMENTIN) 875-125 MG tablet   HYDROcodone-homatropine (HYCODAN) 5-1.5 MG/5ML syrup    Other Visit Diagnoses    Fever, unspecified fever cause       Relevant Orders   POC Influenza A&B(BINAX/QUICKVUE) (Completed)       Meds ordered this encounter  Medications  . amoxicillin-clavulanate (AUGMENTIN) 875-125 MG tablet    Sig: Take 1 tablet by mouth 2 (two) times daily for 10 days.    Dispense:  20 tablet    Refill:  0  . HYDROcodone-homatropine (HYCODAN) 5-1.5 MG/5ML syrup    Sig: Take 5 mLs by mouth 3 (three) times daily as needed  for cough (sedation precautions).    Dispense:  100 mL    Refill:  0   Orders Placed This Encounter  Procedures  . POC Influenza A&B(BINAX/QUICKVUE)    Patient Instructions  I am suspicious for sinus infection. Given duration of symptoms, reasonable to treat with antibiotic - augmentin sent to pharmacy.  Back off guaifenesin to see if diarrhea symptoms improve.  May use naprosyn 220mg  for lower back pain - possible sacroiliac joint inflammation.  Push fluids and rest.  Let us know if not improving with treatment.    Follow up plan: Return if symptoms worsen or fail to improve.  Eustaquio Boyden, MD

## 2018-05-22 NOTE — Telephone Encounter (Signed)
Seen today. 

## 2018-05-22 NOTE — Patient Instructions (Addendum)
I am suspicious for sinus infection. Given duration of symptoms, reasonable to treat with antibiotic - augmentin sent to pharmacy.  Back off guaifenesin to see if diarrhea symptoms improve.  May use naprosyn 220mg  for lower back pain - possible sacroiliac joint inflammation.  Push fluids and rest.  Let us know if not improving with treatment.

## 2018-05-22 NOTE — Assessment & Plan Note (Addendum)
Anticipate bacterial sinusitis given duration and progression of symptoms. Lungs clear. Supportive care reviewed as per instructions. Update if not improving with treatment. Pt agrees with plan.  Diarrhea may have been caused by guaifenesin in extended mucinex he's been taking scheduled - suggested he back off this.

## 2018-05-22 NOTE — Assessment & Plan Note (Signed)
Suspicious for sacroiliitis. rec naprosyn PRN. Update if worsening.

## 2018-05-22 NOTE — Telephone Encounter (Signed)
Pt has been sick for 10 days; started with fever and dry cough; pt's highest fever was 103.2 on 05/16/18. Has managed fever with ibuprofen; last night temp 100.3 and took ibuprofen; now temp is 98, no ibuprofen since last night.pt has dry cough but crackling sound in chest; no respiratory distress;ptis tired and no appetite but drinking 6-8 eight oz glasses of liquid daily.pt taking mucinex DM.pt having watery mucus diarrhea on and off. Last time was last night. Pt said rt lower abd pain at rt hip prior to diarrhea. Pt granddaughter has been dx with sinus infection and on abx. Pt has not traveled out of state but did go to funeral in [place] 2 wks ago and shook hands with a lot of people (some people he did not know) but to pts knowledge has not come in contact with anyone with positive corona or flu. Pt scheduled appt with Dr Reece Agar 05/22/18 at 3:45.ED precautions given. FYI to Dr Reece Agar.

## 2019-01-21 ENCOUNTER — Other Ambulatory Visit: Payer: Self-pay | Admitting: Family Medicine

## 2019-01-21 DIAGNOSIS — Z125 Encounter for screening for malignant neoplasm of prostate: Secondary | ICD-10-CM

## 2019-01-21 DIAGNOSIS — E538 Deficiency of other specified B group vitamins: Secondary | ICD-10-CM | POA: Insufficient documentation

## 2019-01-21 DIAGNOSIS — E785 Hyperlipidemia, unspecified: Secondary | ICD-10-CM

## 2019-01-22 ENCOUNTER — Other Ambulatory Visit: Payer: Self-pay

## 2019-01-22 ENCOUNTER — Other Ambulatory Visit (INDEPENDENT_AMBULATORY_CARE_PROVIDER_SITE_OTHER): Payer: Medicare Other

## 2019-01-22 ENCOUNTER — Ambulatory Visit (INDEPENDENT_AMBULATORY_CARE_PROVIDER_SITE_OTHER): Payer: Medicare Other

## 2019-01-22 DIAGNOSIS — E785 Hyperlipidemia, unspecified: Secondary | ICD-10-CM

## 2019-01-22 DIAGNOSIS — E538 Deficiency of other specified B group vitamins: Secondary | ICD-10-CM

## 2019-01-22 DIAGNOSIS — Z125 Encounter for screening for malignant neoplasm of prostate: Secondary | ICD-10-CM | POA: Diagnosis not present

## 2019-01-22 DIAGNOSIS — Z23 Encounter for immunization: Secondary | ICD-10-CM | POA: Diagnosis not present

## 2019-01-22 LAB — PSA: PSA: 1.46 ng/mL (ref 0.10–4.00)

## 2019-01-22 LAB — COMPREHENSIVE METABOLIC PANEL
ALT: 17 U/L (ref 0–53)
AST: 15 U/L (ref 0–37)
Albumin: 4.2 g/dL (ref 3.5–5.2)
Alkaline Phosphatase: 45 U/L (ref 39–117)
BUN: 15 mg/dL (ref 6–23)
CO2: 28 mEq/L (ref 19–32)
Calcium: 8.8 mg/dL (ref 8.4–10.5)
Chloride: 105 mEq/L (ref 96–112)
Creatinine, Ser: 0.94 mg/dL (ref 0.40–1.50)
GFR: 80.14 mL/min (ref >60.00)
Glucose, Bld: 109 mg/dL — ABNORMAL HIGH (ref 70–99)
Potassium: 4.5 mEq/L (ref 3.5–5.1)
Sodium: 139 mEq/L (ref 135–145)
Total Bilirubin: 0.6 mg/dL (ref 0.2–1.2)
Total Protein: 6.6 g/dL (ref 6.0–8.3)

## 2019-01-22 LAB — LIPID PANEL
Cholesterol: 151 mg/dL (ref 0–200)
HDL: 40.4 mg/dL (ref >39.00)
LDL Cholesterol: 96 mg/dL (ref 0–99)
NonHDL: 110.54
Total CHOL/HDL Ratio: 4
Triglycerides: 71 mg/dL (ref 0.0–149.0)
VLDL: 14.2 mg/dL (ref 0.0–40.0)

## 2019-01-22 LAB — VITAMIN B12: Vitamin B-12: 369 pg/mL (ref 211–911)

## 2019-01-25 ENCOUNTER — Ambulatory Visit: Payer: Medicare Other

## 2019-01-25 ENCOUNTER — Telehealth: Payer: Self-pay

## 2019-01-25 NOTE — Telephone Encounter (Signed)
Called patient and he refused to complete his Medicare Wellness visit. He said he did not have time and he was about to get on his tractor. Advised patient that provider will complete this at his upcoming physical. Appointment was cancelled.

## 2019-01-29 ENCOUNTER — Ambulatory Visit: Payer: Medicare Other

## 2019-01-29 ENCOUNTER — Other Ambulatory Visit: Payer: Self-pay

## 2019-01-29 ENCOUNTER — Encounter: Payer: Self-pay | Admitting: Family Medicine

## 2019-01-29 ENCOUNTER — Ambulatory Visit (INDEPENDENT_AMBULATORY_CARE_PROVIDER_SITE_OTHER): Payer: Medicare Other | Admitting: Family Medicine

## 2019-01-29 VITALS — BP 128/74 | HR 60 | Temp 97.6°F | Ht 69.5 in | Wt 203.0 lb

## 2019-01-29 DIAGNOSIS — J449 Chronic obstructive pulmonary disease, unspecified: Secondary | ICD-10-CM

## 2019-01-29 DIAGNOSIS — E538 Deficiency of other specified B group vitamins: Secondary | ICD-10-CM

## 2019-01-29 DIAGNOSIS — E785 Hyperlipidemia, unspecified: Secondary | ICD-10-CM

## 2019-01-29 DIAGNOSIS — Z0000 Encounter for general adult medical examination without abnormal findings: Secondary | ICD-10-CM | POA: Diagnosis not present

## 2019-01-29 DIAGNOSIS — K219 Gastro-esophageal reflux disease without esophagitis: Secondary | ICD-10-CM

## 2019-01-29 DIAGNOSIS — Z7189 Other specified counseling: Secondary | ICD-10-CM

## 2019-01-29 MED ORDER — DICYCLOMINE HCL 10 MG PO CAPS: 10.0000 mg | ORAL_CAPSULE | Freq: Two times a day (BID) | ORAL | 3 refills | Status: DC | PRN

## 2019-01-29 NOTE — Assessment & Plan Note (Signed)
Followed by pulm. On anoro (uses intermittently) and albuterol.

## 2019-01-29 NOTE — Assessment & Plan Note (Addendum)
Advanced directives discussion: has all this at home. Wife is HCPOA. Asked to bring us copy.  

## 2019-01-29 NOTE — Assessment & Plan Note (Signed)

## 2019-01-29 NOTE — Patient Instructions (Addendum)
If interested, check with pharmacy about new 2 shot shingles series (shingrix).  Bring [place] copy of living will at your convenience.  I've refilled dicyclomine.  You are doing well today Return as needed or in 1 year for next wellness visit.   Health Maintenance After Age 66 After age 64, you are at a higher risk for certain long-term diseases and infections as well as injuries from falls. Falls are a major cause of broken bones and head injuries in people who are older than age 7. Getting regular preventive care can help to keep you healthy and well. Preventive care includes getting regular testing and making lifestyle changes as recommended by your health care provider. Talk with your health care provider about:  Which screenings and tests you should have. A screening is a test that checks for a disease when you have no symptoms.  A diet and exercise plan that is right for you. What should I know about screenings and tests to prevent falls? Screening and testing are the best ways to find a health problem early. Early diagnosis and treatment give you the best chance of managing medical conditions that are common after age 32. Certain conditions and lifestyle choices may make you more likely to have a fall. Your health care provider may recommend:  Regular vision checks. Poor vision and conditions such as cataracts can make you more likely to have a fall. If you wear glasses, make sure to get your prescription updated if your vision changes.  Medicine review. Work with your health care provider to regularly review all of the medicines you are taking, including over-the-counter medicines. Ask your health care provider about any side effects that may make you more likely to have a fall. Tell your health care provider if any medicines that you take make you feel dizzy or sleepy.  Osteoporosis screening. Osteoporosis is a condition that causes the bones to get weaker. This can make the bones weak and  cause them to break more easily.  Blood pressure screening. Blood pressure changes and medicines to control blood pressure can make you feel dizzy.  Strength and balance checks. Your health care provider may recommend certain tests to check your strength and balance while standing, walking, or changing positions.  Foot health exam. Foot pain and numbness, as well as not wearing proper footwear, can make you more likely to have a fall.  Depression screening. You may be more likely to have a fall if you have a fear of falling, feel emotionally low, or feel unable to do activities that you used to do.  Alcohol use screening. Using too much alcohol can affect your balance and may make you more likely to have a fall. What actions can I take to lower my risk of falls? General instructions  Talk with your health care provider about your risks for falling. Tell your health care provider if: ? You fall. Be sure to tell your health care provider about all falls, even ones that seem minor. ? You feel dizzy, sleepy, or off-balance.  Take over-the-counter and prescription medicines only as told by your health care provider. These include any supplements.  Eat a healthy diet and maintain a healthy weight. A healthy diet includes low-fat dairy products, low-fat (lean) meats, and fiber from whole grains, beans, and lots of fruits and vegetables. Home safety  Remove any tripping hazards, such as rugs, cords, and clutter.  Install safety equipment such as grab bars in bathrooms and safety rails on  stairs.  Keep rooms and walkways well-lit. Activity   Follow a regular exercise program to stay fit. This will help you maintain your balance. Ask your health care provider what types of exercise are appropriate for you.  If you need a cane or walker, use it as recommended by your health care provider.  Wear supportive shoes that have nonskid soles. Lifestyle  Do not drink alcohol if your health care  provider tells you not to drink.  If you drink alcohol, limit how much you have: ? 0-1 drink a day for women. ? 0-2 drinks a day for men.  Be aware of how much alcohol is in your drink. In the [place] one drink equals one typical bottle of beer (12 oz), one-half glass of wine (5 oz), or one shot of hard liquor (1 oz).  Do not use any products that contain nicotine or tobacco, such as cigarettes and e-cigarettes. If you need help quitting, ask your health care provider. Summary  Having a healthy lifestyle and getting preventive care can help to protect your health and wellness after age 33.  Screening and testing are the best way to find a health problem early and help you avoid having a fall. Early diagnosis and treatment give you the best chance for managing medical conditions that are more common for people who are older than age 86.  Falls are a major cause of broken bones and head injuries in people who are older than age 65. Take precautions to prevent a fall at home.  Work with your health care provider to learn what changes you can make to improve your health and wellness and to prevent falls. This information is not intended to replace advice given to you by your health care provider. Make sure you discuss any questions you have with your health care provider. Document Released: 01/04/2017 Document Revised: 06/14/2018 Document Reviewed: 01/04/2017 Elsevier Patient Education  2020 [employer].

## 2019-01-29 NOTE — Assessment & Plan Note (Signed)
Chronic, stable off meds. The 10-year ASCVD risk score Mikey Bussing DC Brooke Bonito., et al., 2013) is: 12.8%   Values used to calculate the score:     Age: 68 years     Sex: Male     Is Non-Hispanic African American: No     Diabetic: No     Tobacco smoker: No     Systolic Blood Pressure: 072 mmHg     Is BP treated: No     HDL Cholesterol: 40.4 mg/dL     Total Cholesterol: 151 mg/dL

## 2019-01-29 NOTE — Assessment & Plan Note (Signed)
Preventative protocols reviewed and updated unless pt declined. Discussed healthy diet and lifestyle.  

## 2019-01-29 NOTE — Assessment & Plan Note (Signed)
Continue MVI 

## 2019-01-29 NOTE — Assessment & Plan Note (Signed)
Stable on pepcid

## 2019-01-29 NOTE — Progress Notes (Signed)
This visit was conducted in person.  BP 128/74 (BP Location: Left Arm, Patient Position: Sitting, Cuff Size: Normal)   Pulse 60   Temp 97.6 F (36.4 C) (Temporal)   Ht 5' 9.5" (1.765 m)   Wt 203 lb (92.1 kg)   SpO2 97%   BMI 29.55 kg/m    CC: CPE/AMW Subjective:    Patient ID: Terrence Middleton, male    DOB: 03-24-1950, 68 y.o.   MRN: [id number]  HPI: Terrence Middleton is a 68 y.o. male presenting on 01/29/2019 for Medicare Wellness   Spoke with health advisor last week for medicare wellness visit but he declined to complete over the phone.    Hearing Screening   125Hz  250Hz  500Hz  1000Hz  2000Hz  3000Hz  4000Hz  6000Hz  8000Hz   Right ear:   20 20 20  20     Left ear:   20 20 20   0      Visual Acuity Screening   Right eye Left eye Both eyes  Without correction:     With correction: 20/15 20/20 20/13       Office Visit from 01/29/2019 in [facility] at [facility] Total Score  0      Fall Risk  01/29/2019 01/19/2018  Falls in the past year? 0 0     Asthmatic bronchitis seen by Dr - on Anoro and albuterol PRN. Has not recently needed Anoro.   Uses dicyclomine for GI spasms  Preventative: COLONOSCOPY Date: 11/2013 WNL good for 10 yrs ) Prostate screening - pt would like to continue DRE/PSA. Nocturia x1.  Flu shotyearly Td 2005/07/20. Pneumovax - 02/2015, prevnar 07/21/15 zostavax- 11/2012 shingrix - discussed Advanced directives discussion: has all this at home. Wife is HCPOA. Asked to bring [place] copy.  Seat belt use discussed.  Sunscreen use discussed.No changing moles on skin. [facility]es [facility] had BCC removed.  Ex smoker  Alcohol - 1 light beer/day  Dentist q6 mo Eye exam yearly Bowel - no constipation  Bladder - no incontinence   Lives with wife, dog 01/31/2019).  Grown children. 33yo 01/21/2018 son passed away 21-Jul-2014 (pulm fibrosis - Hermanske-Pudlike syndrome)  Occupation: retired - was 12/2013 at [employer]  Activity: no  regular exercise. Does walk at work and stays active at home.  Diet: good water, fruits/vegetables daily     Relevant past medical, surgical, family and social history reviewed and updated as indicated. Interim medical history since our last visit reviewed. Allergies and medications reviewed and updated. Outpatient Medications Prior to Visit  Medication Sig Dispense Refill  . albuterol (PROAIR HFA) 108 (90 Base) MCG/ACT inhaler INHALE 2 PUFFS INTO THE LUNGS EVERY 4 (FOUR) HOURS AS NEEDED FOR WHEEZING OR SHORTNESS OF BREATH. 8.5 Inhaler 12  . EPINEPHrine 0.3 mg/0.3 mL IJ SOAJ injection Inject into thigh for severe allergic reaction 1 Device prn  . famotidine (PEPCID) 20 MG tablet Take 20 mg by mouth daily.    2008 HYDROcodone-homatropine (HYCODAN) 5-1.5 MG/5ML syrup Take 5 mLs by mouth 3 (three) times daily as needed for cough (sedation precautions). 100 mL 0  . Multiple Vitamins-Minerals (MULTIVITAMIN MEN PO) Take 1 tablet by mouth daily.    . Omega-3 Fatty Acids (OMEGA 3 PO) Take 2 tablets by mouth daily.    . Sodium Chloride-Sodium Bicarb (AYR SALINE NASAL RINSE) 1.57 G PACK Place into the nose. Uses every morning    . umeclidinium-vilanterol (ANORO ELLIPTA) 62.5-25 MCG/INH AEPB INHALE 1 PUFF INTO THE LUNGS DAILY. (Patient taking  differently: INHALE 1 PUFF INTO THE LUNGS DAILY. As needed) 180 each 12  . Multiple Vitamins-Minerals (VISION PLUS PO) Take 2 tablets by mouth daily.     No facility-administered medications prior to visit.      Per HPI unless specifically indicated in ROS section below Review of Systems  Constitutional: Negative for activity change, appetite change, chills, fatigue, fever and unexpected weight change.  HENT: Negative for hearing loss.   Eyes: Negative for visual disturbance.  Respiratory: Negative for cough, chest tightness, shortness of breath and wheezing.   Cardiovascular: Negative for chest pain, palpitations and leg swelling.  Gastrointestinal: Negative for  abdominal distention, abdominal pain, blood in stool, constipation, diarrhea, nausea and vomiting.  Genitourinary: Negative for difficulty urinating and hematuria.  Musculoskeletal: Negative for arthralgias, myalgias and neck pain.  Skin: Negative for rash.  Neurological: Negative for dizziness, seizures, syncope and headaches.  Hematological: Negative for adenopathy. Does not bruise/bleed easily.  Psychiatric/Behavioral: Negative for dysphoric mood. The patient is not nervous/anxious.    Objective:    BP 128/74 (BP Location: Left Arm, Patient Position: Sitting, Cuff Size: Normal)   Pulse 60   Temp 97.6 F (36.4 C) (Temporal)   Ht 5' 9.5" (1.765 m)   Wt 203 lb (92.1 kg)   SpO2 97%   BMI 29.55 kg/m   Wt Readings from Last 3 Encounters:  01/29/19 203 lb (92.1 kg)  05/22/18 204 lb (92.5 kg)  02/21/18 212 lb (96.2 kg)    Physical Exam Vitals signs and nursing note reviewed.  Constitutional:      General: He is not in acute distress.    Appearance: Normal appearance. He is well-developed. He is not ill-appearing.  HENT:     Head: Normocephalic and atraumatic.     Right Ear: Hearing, tympanic membrane, ear canal and external ear normal.     Left Ear: Hearing, tympanic membrane, ear canal and external ear normal.     Nose: Nose normal.     Mouth/Throat:     Mouth: Mucous membranes are moist.     Pharynx: Oropharynx is clear. Uvula midline. No posterior oropharyngeal erythema.  Eyes:     General: No scleral icterus.    Extraocular Movements: Extraocular movements intact.     Conjunctiva/sclera: Conjunctivae normal.     Pupils: Pupils are equal, round, and reactive to light.  Neck:     Musculoskeletal: Normal range of motion and neck supple.     Vascular: No carotid bruit.  Cardiovascular:     Rate and Rhythm: Normal rate and regular rhythm.     Pulses: Normal pulses.          Radial pulses are 2+ on the right side and 2+ on the left side.     Heart sounds: Normal heart  sounds. No murmur.  Pulmonary:     Effort: Pulmonary effort is normal. No respiratory distress.     Breath sounds: Normal breath sounds. No wheezing, rhonchi or rales.  Abdominal:     General: Abdomen is flat. Bowel sounds are normal. There is no distension.     Palpations: Abdomen is soft. There is no mass.     Tenderness: There is no abdominal tenderness. There is no guarding or rebound.     Hernia: A hernia is present. Hernia is present in the umbilical area.  Musculoskeletal: Normal range of motion.     Right lower leg: No edema.     Left lower leg: No edema.  Lymphadenopathy:  Cervical: No cervical adenopathy.  Skin:    General: Skin is warm and dry.     Findings: No rash.  Neurological:     General: No focal deficit present.     Mental Status: He is alert and oriented to person, place, and time.     Comments:  CN grossly intact, station and gait intact Recall 3/3 Calculation 5/5 DLROW  Psychiatric:        Mood and Affect: Mood normal.        Behavior: Behavior normal.        Thought Content: Thought content normal.        Judgment: Judgment normal.       Results for orders placed or performed in visit on 01/22/19  PSA  Result Value Ref Range   PSA 1.46 0.10 - 4.00 ng/mL  Vitamin B12  Result Value Ref Range   Vitamin B-12 369 211 - 911 pg/mL  Comprehensive metabolic panel  Result Value Ref Range   Sodium 139 135 - 145 mEq/L   Potassium 4.5 3.5 - 5.1 mEq/L   Chloride 105 96 - 112 mEq/L   CO2 28 19 - 32 mEq/L   Glucose, Bld 109 (H) 70 - 99 mg/dL   BUN 15 6 - 23 mg/dL   Creatinine, Ser 6.57 0.40 - 1.50 mg/dL   Total Bilirubin 0.6 0.2 - 1.2 mg/dL   Alkaline Phosphatase 45 39 - 117 U/L   AST 15 0 - 37 U/L   ALT 17 0 - 53 U/L   Total Protein 6.6 6.0 - 8.3 g/dL   Albumin 4.2 3.5 - 5.2 g/dL   GFR 84.69 >62.95 mL/min   Calcium 8.8 8.4 - 10.5 mg/dL  Lipid panel  Result Value Ref Range   Cholesterol 151 0 - 200 mg/dL   Triglycerides 28.4 0.0 - 149.0 mg/dL    HDL 13.24 >40.10 mg/dL   VLDL 27.2 0.0 - 53.6 mg/dL   LDL Cholesterol 96 0 - 99 mg/dL   Total CHOL/HDL Ratio 4    NonHDL 110.54    Assessment & Plan:  This visit occurred during the SARS-CoV-2 public health emergency.  Safety protocols were in place, including screening questions prior to the visit, additional usage of staff PPE, and extensive cleaning of exam room while observing appropriate contact time as indicated for disinfecting solutions.   Problem List Items Addressed This Visit    Medicare annual wellness visit, initial    I have personally reviewed the Medicare Annual Wellness questionnaire and have noted 1. The patient's medical and social history 2. Their use of alcohol, tobacco or illicit drugs 3. Their current medications and supplements 4. The patient's functional ability including ADL's, fall risks, home safety risks and hearing or visual impairment. Cognitive function has been assessed and addressed as indicated.  5. Diet and physical activity 6. Evidence for depression or mood disorders The patients weight, height, BMI have been recorded in the chart. I have made referrals, counseling and provided education to the patient based on review of the above and I have provided the pt with a written personalized care plan for preventive services. Provider list updated.. See scanned questionairre as needed for further documentation. Reviewed preventative protocols and updated unless pt declined.       Low serum vitamin B12    Continue MVI      Health maintenance examination    Preventative protocols reviewed and updated unless pt declined. Discussed healthy diet and lifestyle.  GERD    Stable on pepcid.      Relevant Medications   dicyclomine (BENTYL) 10 MG capsule   Dyslipidemia    Chronic, stable off meds. The 10-year ASCVD risk score Mikey Bussing DC Brooke Bonito., et al., 2013) is: 12.8%   Values used to calculate the score:     Age: 67 years     Sex: Male     Is  Non-Hispanic African American: No     Diabetic: No     Tobacco smoker: No     Systolic Blood Pressure: 413 mmHg     Is BP treated: No     HDL Cholesterol: 40.4 mg/dL     Total Cholesterol: 151 mg/dL       Asthmatic bronchitis , chronic (HCC)    Followed by pulm. On anoro (uses intermittently) and albuterol.       Advanced care planning/counseling discussion    Advanced directives discussion: has all this at home. Wife is HCPOA. Asked to bring [place] copy.           Meds ordered this encounter  Medications  . dicyclomine (BENTYL) 10 MG capsule    Sig: Take 1 capsule (10 mg total) by mouth 2 (two) times daily as needed for spasms.    Dispense:  30 capsule    Refill:  3   No orders of the defined types were placed in this encounter.  Patient instructions: If interested, check with pharmacy about new 2 shot shingles series (shingrix).  Bring [place] copy of living will at your convenience.  I've refilled dicyclomine.  You are doing well today Return as needed or in 1 year for next wellness visit.   Follow up plan: Return in about 1 year (around 01/29/2020) for annual exam, prior fasting for blood work, medicare wellness visit.  Ria Bush, MD

## 2019-02-11 ENCOUNTER — Other Ambulatory Visit: Payer: Self-pay | Admitting: Family Medicine

## 2019-02-13 NOTE — Telephone Encounter (Signed)
Our records show rx for #30/3 sent 01/29/19.  [facility] states pt's ins co is requesting 90-day rx.

## 2019-02-19 ENCOUNTER — Telehealth: Payer: Self-pay | Admitting: Internal Medicine

## 2019-02-19 NOTE — Telephone Encounter (Signed)
Ok to change to televisit 

## 2019-02-19 NOTE — Telephone Encounter (Signed)
Spoke with pt, wants to know if he can move his appt on Friday to a televisit.  This is a 1 year follow-up, pt states he is feeling well, mostly wants to discuss his medications and shingles/covid vaccines.  CY please advise if ok to switch to televisit, or if he needs to come in since not being seen X1 year.    (please don't call back patient with recs- pt is requesting that we MyChart him to update him.  Thanks!)

## 2019-02-19 NOTE — Telephone Encounter (Signed)
lmtcb for pt.  

## 2019-02-19 NOTE — Telephone Encounter (Signed)
Patient is returning phone call.  Patient phone number is [phone].

## 2019-02-20 NOTE — Telephone Encounter (Signed)
appt changed to televisit in chart.  MyChart message sent to pt at his request to make aware.  Nothing further needed at this time- will close encounter.

## 2019-02-22 ENCOUNTER — Ambulatory Visit (INDEPENDENT_AMBULATORY_CARE_PROVIDER_SITE_OTHER): Payer: Medicare Other | Admitting: Internal Medicine

## 2019-02-22 ENCOUNTER — Other Ambulatory Visit: Payer: Self-pay

## 2019-02-22 DIAGNOSIS — R0609 Other forms of dyspnea: Secondary | ICD-10-CM

## 2019-02-22 DIAGNOSIS — J4520 Mild intermittent asthma, uncomplicated: Secondary | ICD-10-CM | POA: Diagnosis not present

## 2019-02-22 DIAGNOSIS — Z91038 Other insect allergy status: Secondary | ICD-10-CM | POA: Diagnosis not present

## 2019-02-22 DIAGNOSIS — R0600 Dyspnea, unspecified: Secondary | ICD-10-CM

## 2019-02-22 DIAGNOSIS — J449 Chronic obstructive pulmonary disease, unspecified: Secondary | ICD-10-CM

## 2019-02-22 MED ORDER — ANORO ELLIPTA 62.5-25 MCG/INH IN AEPB: INHALATION_SPRAY | RESPIRATORY_TRACT | 12 refills | Status: DC

## 2019-02-22 MED ORDER — ALBUTEROL SULFATE HFA 108 (90 BASE) MCG/ACT IN AERS: INHALATION_SPRAY | RESPIRATORY_TRACT | 12 refills | Status: DC

## 2019-02-22 MED ORDER — EPINEPHRINE 0.3 MG/0.3ML IJ SOAJ: INTRAMUSCULAR | 12 refills | Status: DC

## 2019-02-22 NOTE — Progress Notes (Signed)
HPI male never smoker followed for asthma, allergic rhinitis, complicated by GERD, history of insect venom allergy/ Doctor, general practice, peripheral venous insufficiency (son died of Hermansky- Pudlak syndrome) a1AT MS 119 02/16/12- unusual variant but adequate enzyme protection PFT 03/04/16-FVC 4.64/101%, FEV1 3.07/89%, ratio 0.66, FEF 25-75% 2.34/85%, TLC 96%, DLCO 92%, insignificant response to dilator. ------------------------------------------------   02/21/2018- 68 year old male never smoker followed for asthma, allergic rhinitis, complicated by GERD, history of insect venom allergy, peripheral venous insufficiency (son died of Hermansky- Pudlak syndrome) -----Doing well at this time he has had a URI at the start of the year but nothing since. Proair hfa, EpiPen (beekeeper), Anoro To mild bronchitic flareups in the last year were treated by his primary physician successfully.  Otherwise he says he is had a very good year with rare need for rescue inhaler.  He uses Anoro for intervals when he thinks he needs it, but usually feels clear.   02/22/2019- Virtual Visit via Telephone Note  I connected with Duaine Dredge on 02/22/19 at 10:30 AM EST by telephone and verified that I am speaking with the correct person using two identifiers.  Location: Patient: H Provider: O   I discussed the limitations, risks, security and privacy concerns of performing an evaluation and management service by telephone and the availability of in person appointments. I also discussed with the patient that there may be a patient responsible charge related to this service. The patient expressed understanding and agreed to proceed.   History of Present Illness: 68 year old male never smoker followed for asthma, allergic rhinitis, complicated by GERD, history of insect venom allergy, peripheral venous insufficiency (son died of Hermansky- Pudlak syndrome) Proair hfa, EpiPen (beekeeper), Anoro No major issues. Not  needing Anoro. Not much wheeze. Does note dyspnea and heart pounds more than he expects if running. 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 He isn't interested in Covid antibody test now to see if was covid.    Observations/Objective:   Assessment and Plan: Asthma mild intermittent- not needing maintenance inhaler now Dyspnea on exertion  Follow Up Instructions:    I discussed the assessment and treatment plan with the patient. The patient was provided an opportunity to ask questions and all were answered. The patient agreed with the plan and demonstrated an understanding of the instructions.   The patient was advised to call back or seek an in-person evaluation if the symptoms worsen or if the condition fails to improve as anticipated.  I provided 22 minutes of non-face-to-face time during this encounter.   Baird Lyons, MD    ROS-see HPI + = positive Constitutional:   No-   weight loss, night sweats, fevers, chills, fatigue, lassitude. HEENT:   No-  headaches, difficulty swallowing, tooth/dental problems, sore throat,       No-  sneezing, itching, ear ache, nasal congestion, post nasal drip,  CV:  No-   chest pain, orthopnea, PND, swelling in lower extremities, anasarca, dizziness, palpitations Resp: +shortness of breath with exertion or at rest.                productive cough,  No non-productive cough,  No- coughing up of blood.               change in color of mucus.  No- wheezing.   Skin: No-   rash or lesions. GI:  No-   heartburn, indigestion, abdominal pain, nausea, vomiting,  GU: MS:  .  Neuro-     nothing unusual Psych:  No- change in mood or affect. No depression or anxiety.  No memory loss.  OBJ General- Alert, Oriented, Affect-appropriate, Distress- none acute Skin-  Lymphadenopathy- none Head- atraumatic            Eyes- Gross vision intact, PERRLA, conjunctivae clear secretions             Ears- Hearing, canals-normal            Nose- Clear, no-Septal dev, mucus, polyps, erosion, perforation             Throat- Mallampati II, drainage- none, tonsils- atrophic.  Neck- flexible , trachea midline, no stridor , thyroid nl, carotid no bruit Chest - symmetrical excursion , unlabored           Heart/CV- RRR , no murmur , no gallop  , no rub, nl s1 s2                           - JVD- none , edema-none, stasis changes- none, varices- none           Lung- cough-none, unlabored , dullness-none, rub- none           Chest wall-  Abd-  Br/ Gen/ Rectal- Not done, not indicated Extrem- cyanosis- none, clubbing, none, atrophy- none, strength- nl.  Neuro- grossly intact to observation

## 2019-02-22 NOTE — Patient Instructions (Signed)
As discussed, if you remain concerned about shortness of breath and heart pounding with less exercise tolerance than you feel you should have, I would want to make sure your heart is ok, probably with an exercise stress test through cardiology.   Refills e-sent for your breathing meds  Please call if we can help

## 2019-04-08 ENCOUNTER — Encounter: Payer: Self-pay | Admitting: Internal Medicine

## 2019-04-08 DIAGNOSIS — R0609 Other forms of dyspnea: Secondary | ICD-10-CM | POA: Insufficient documentation

## 2019-04-08 NOTE — Assessment & Plan Note (Signed)
We will refill Epipen, but he reports no problems.

## 2019-04-08 NOTE — Assessment & Plan Note (Signed)
He describes pounding heart and dyspnea when he runs. No chest pain, palpitation or edema. 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 Son died with pulmonary fibrosis related to inherited Hermansky-Pudlac syndrome. I discussed availability of stress test through cardiology- he [facility] for now.

## 2019-04-08 NOTE — Assessment & Plan Note (Signed)
He feels comfortable with occasional rescue inhaler Plan- refill rescue inhaler and keep Anoro available to him in case status changes.

## 2019-04-14 ENCOUNTER — Ambulatory Visit: Payer: Medicare Other

## 2019-04-29 ENCOUNTER — Ambulatory Visit: Payer: Medicare Other

## 2019-06-05 ENCOUNTER — Telehealth: Payer: Self-pay | Admitting: Internal Medicine

## 2019-06-05 NOTE — Telephone Encounter (Signed)
PA request was received from (pharmacy): yes  [facility] Phone:[phone] Fax:  Medication name and strength: Anoro Ordering Provider: Dr.Young  Was PA started with CMM?: Yes If yes, please enter KEY: [id number] Medication tried and failed: Dulera 100-5MCG and Advair Diskus 250-50MCG Covered Alternatives:   PA sent to plan, time frame for approval / denial:Denied  Patient needs to try and fail  Stiolto Respimat and [employer]. Patient was notified.  Northing else further needed.

## 2019-06-05 NOTE — Telephone Encounter (Signed)
PA [facility] was received from [facility]pharm[facility] [place] Phone: [phone] Fax:  Medication name and strength: Anoro Ordering Provider: Zack Seal  Was PA started with CMM?: Yes If yes, please enter KEY: [id number] Medication tried and failed: Dulera 100-82mcg and Advair Diskus 250-2mcg Covered Alternatives:   PA sent to plan, time frame for approval / denial: 3-7 days Routing to [place] for follow-up

## 2019-06-10 MED ORDER — STIOLTO RESPIMAT 2.5-2.5 MCG/ACT IN AERS: 2.0000 | INHALATION_SPRAY | Freq: Every day | RESPIRATORY_TRACT | 5 refills | Status: DC

## 2019-06-10 NOTE — Addendum Note (Signed)
Addended by: Jaynee Eagles C on: 06/10/2019 01:19 PM   Modules accepted: Orders

## 2019-06-10 NOTE — Telephone Encounter (Signed)
Reopening PA because it looks like CY was never made aware of recs, and no alternative med was called in.  CY please advise if you prefer to switch pt to [employer] or Bevespi (to replace Anoro)  per insurance requirements.  Thanks!

## 2019-06-10 NOTE — Telephone Encounter (Signed)
Suggest he try Stiolto  # 1, inhale 2 puffs,once daily, Ref x 5 instead of Anoro, since insurance won't cover Anoro.

## 2019-06-11 ENCOUNTER — Telehealth: Payer: Self-pay | Admitting: Internal Medicine

## 2019-06-11 NOTE — Telephone Encounter (Signed)
See telephone encounter (06/05/2019), this has already been addressed. Pt has been made aware. Nothing further needed.

## 2019-06-27 ENCOUNTER — Encounter: Payer: Self-pay | Admitting: Emergency Medicine

## 2019-06-27 ENCOUNTER — Other Ambulatory Visit: Payer: Self-pay

## 2019-06-27 ENCOUNTER — Emergency Department: Payer: Medicare PPO

## 2019-06-27 ENCOUNTER — Emergency Department
Admission: EM | Admit: 2019-06-27 | Discharge: 2019-06-27 | Disposition: A | Discharge status: Home / Self Care | Payer: Medicare PPO | Attending: Student in an Organized Health Care Education/Training Program | Admitting: Student in an Organized Health Care Education/Training Program

## 2019-06-27 DIAGNOSIS — M542 Cervicalgia: Secondary | ICD-10-CM | POA: Insufficient documentation

## 2019-06-27 DIAGNOSIS — Y9389 Activity, other specified: Secondary | ICD-10-CM | POA: Diagnosis not present

## 2019-06-27 DIAGNOSIS — Y92008 Other place in unspecified non-institutional (private) residence as the place of occurrence of the external cause: Secondary | ICD-10-CM | POA: Insufficient documentation

## 2019-06-27 DIAGNOSIS — S0990XA Unspecified injury of head, initial encounter: Secondary | ICD-10-CM | POA: Diagnosis present

## 2019-06-27 DIAGNOSIS — Z79899 Other long term (current) drug therapy: Secondary | ICD-10-CM | POA: Insufficient documentation

## 2019-06-27 DIAGNOSIS — Y999 Unspecified external cause status: Secondary | ICD-10-CM | POA: Insufficient documentation

## 2019-06-27 DIAGNOSIS — S0003XA Contusion of scalp, initial encounter: Secondary | ICD-10-CM | POA: Diagnosis not present

## 2019-06-27 DIAGNOSIS — W01198A Fall on same level from slipping, tripping and stumbling with subsequent striking against other object, initial encounter: Secondary | ICD-10-CM | POA: Diagnosis not present

## 2019-06-27 DIAGNOSIS — Z23 Encounter for immunization: Secondary | ICD-10-CM | POA: Insufficient documentation

## 2019-06-27 DIAGNOSIS — J45909 Unspecified asthma, uncomplicated: Secondary | ICD-10-CM | POA: Insufficient documentation

## 2019-06-27 DIAGNOSIS — R410 Disorientation, unspecified: Secondary | ICD-10-CM | POA: Insufficient documentation

## 2019-06-27 MED ORDER — TETANUS-DIPHTH-ACELL PERTUSSIS 5-2.5-18.5 LF-MCG/0.5 IM SUSP
0.5000 mL | Freq: Once | INTRAMUSCULAR | Status: AC
  Administered 2019-06-27: 0.5 mL via INTRAMUSCULAR
  Filled 2019-06-27: qty 0.5

## 2019-06-27 MED ORDER — BACITRACIN ZINC 500 UNIT/GM EX OINT
TOPICAL_OINTMENT | Freq: Once | CUTANEOUS | Status: AC
  Administered 2019-06-27: 1 via TOPICAL
  Filled 2019-06-27: qty 0.9

## 2019-06-27 MED ORDER — CYCLOBENZAPRINE HCL 5 MG PO TABS: 5.0000 mg | ORAL_TABLET | Freq: Every evening | ORAL | 0 refills | Status: DC | PRN

## 2019-06-27 MED ORDER — BUTALBITAL-APAP-CAFFEINE 50-325-40 MG PO TABS: 1.0000 | ORAL_TABLET | Freq: Four times a day (QID) | ORAL | 0 refills | Status: DC | PRN

## 2019-06-27 NOTE — ED Triage Notes (Addendum)
Pt reports he was moving metal scaffolding Tuesday and part of the material slid down and hit him on L side of head. Laceration noted to side of L eye and hematoma to L side of head. Pt reports he was moving same material today and tripped, fell backward and hit back of head. Denies LOC for either incident. C/o 5/10 headache and "fogginess." Pt states he has has concussion before and feels similar. Hx C3 fracture with previous incident, no surgery. Tetanus up to date. Denies blood thinner use. C-spine not tender to palpation. Refused C-collar.

## 2019-06-27 NOTE — ED Provider Notes (Signed)
[facility] Emergency Department Provider Note    First MD Initiated Contact with Patient 06/27/19 1513     (approximate)  I have reviewed the triage vital signs and the nursing notes.   HISTORY  Chief Complaint Head Injury    HPI Terrence Middleton is a 69 y.o. male   presents the ER for evaluation of headache some confusion and neck discomfort after the patient had mechanical fall today.  Patient actually fell hitting his head over the weekend as he was trying to move some scaffolding that is in his shed.  One of the pieces fell striking his head.  There was no prolonged LOC.  Did hit the left side of his head.  He did have some bleeding related small laceration had Steri-Strips placed at home.  Had some headache yesterday but was otherwise feeling well this morning so he went back out of the shed to start working and unfortunately scaffolding again fell pushing on the back where he fell hitting the ground striking the back of his head.  Again there is no LOC.  States he did feel dazed.  Is complaining of some neck pain.  Is not any blood thinners.   Past Medical History:  Diagnosis Date  . ALLERGIC RHINITIS 02/14/2008   Young MD, Tarri Fuller D   . Allergy to insect stings 02/10/2011   Hymenoptera sting   . Asthma    exercise induced per patient Annamaria Boots)  . GERD (gastroesophageal reflux disease)   . PVD (posterior vitreous detachment), left    Family History  Problem Relation Age of Onset  . Asthma Mother   . COPD Mother   . Emphysema Father   . Lymphoma Father 27  . Crohn's disease Son        Colonectomy  . Pulmonary disease Son 27       passed away - Hermansky-Pudlak syndrome  . Colon cancer Neg Hx    Past Surgical History:  Procedure Laterality Date  . cervical fracture  2008   fractured c3 after fall  . COLONOSCOPY  05/2006   colon polyps Olevia Perches)  . COLONOSCOPY  11/2013   WNL Olevia Perches)   Patient Active Problem List   Diagnosis Date Noted  .  Dyspnea on exertion 04/08/2019  . Health maintenance examination 01/29/2019  . Low serum vitamin B12 01/21/2019  . Advanced care planning/counseling discussion 01/19/2018  . PVD (posterior vitreous detachment), left 09/04/2015  . Anosmia 01/26/2015  . Right low back pain 01/26/2015  . Asthmatic bronchitis , chronic ([place]) 03/09/2013  . Dyslipidemia 11/12/2012  . Medicare annual wellness visit, initial 11/04/2011  . Allergy to insect stings 02/10/2011  . COLONIC POLYPS, HX OF 06/23/2009  . ALLERGIC RHINITIS 02/14/2008  . GERD 02/14/2008      Prior to Admission medications   Medication Sig Start Date End Date Taking? Authorizing Provider  albuterol (PROAIR HFA) 108 (90 Base) MCG/ACT inhaler INHALE 2 PUFFS INTO THE LUNGS EVERY 4 (FOUR) HOURS AS NEEDED FOR WHEEZING OR SHORTNESS OF BREATH. 02/22/19   Deneise Lever, MD  butalbital-acetaminophen-caffeine (FIORICET) [phone] MG tablet Take 1 tablet by mouth every 6 (six) hours as needed for headache. 06/27/19 06/26/20  Merlyn Lot, MD  cyclobenzaprine (FLEXERIL) 5 MG tablet Take 1 tablet (5 mg total) by mouth at bedtime as needed for muscle spasms. 06/27/19   Merlyn Lot, MD  dicyclomine (BENTYL) 10 MG capsule TAKE 1 CAPSULE (10 MG TOTAL) BY MOUTH 2 (TWO) TIMES DAILY AS NEEDED FOR  SPASMS. 02/13/19   Eustaquio Boyden, MD  EPINEPHrine 0.3 mg/0.3 mL IJ SOAJ injection Inject into thigh for severe allergic reaction 02/22/19   Jetty Duhamel D, MD  famotidine (PEPCID) 20 MG tablet Take 20 mg by mouth daily.    [provider]  Multiple Vitamins-Minerals (MULTIVITAMIN MEN PO) Take 1 tablet by mouth daily.    [provider]  Omega-3 Fatty Acids (OMEGA 3 PO) Take 2 tablets by mouth daily.    [provider]  Sodium Chloride-Sodium Bicarb (AYR SALINE NASAL RINSE) 1.57 G PACK Place into the nose. Uses every morning    [provider]  Tiotropium Bromide-Olodaterol (STIOLTO RESPIMAT) 2.5-2.5 MCG/ACT AERS  Inhale 2 puffs into the lungs daily. 06/10/19   Young, Joni Fears D, MD  umeclidinium-vilanterol (ANORO ELLIPTA) 62.5-25 MCG/INH AEPB INHALE 1 PUFF INTO THE LUNGS DAILY. 02/22/19   Waymon Budge, MD    Allergies Sulfonamide derivatives    Social History Social History   Tobacco Use  . Smoking status: Never Smoker  . Smokeless tobacco: Never Used  Substance Use Topics  . Alcohol use: Yes    Alcohol/week: 5.0 - 6.0 standard drinks    Types: 5 - 6 Cans of beer per week    Comment: occasionally beer  . Drug use: No    Review of Systems Patient denies headaches, rhinorrhea, blurry vision, numbness, shortness of breath, chest pain, edema, cough, abdominal pain, nausea, vomiting, diarrhea, dysuria, fevers, rashes or hallucinations unless otherwise stated above in HPI. ____________________________________________   PHYSICAL EXAM:  VITAL SIGNS: Vitals:   06/27/19 1258  BP: 138/73  Pulse: 71  Resp: 16  Temp: 97.9 F (36.6 C)  SpO2: 99%    Constitutional: Alert and oriented.  Eyes: Conjunctivae are normal. EOMI, no proptosis, small amount of ecchymosis around the left globe Head: Small contusion to left side of his scalp.  No basilar hematoma.  No hemotympanum.  1 cm superficial laceration that is well approximated and appears to be well healing.  No purulence or surrounding cellulitis Nose: No congestion/rhinnorhea. Mouth/Throat: Mucous membranes are moist.   Neck: No stridor. Painless ROM.  Cardiovascular: Normal rate, regular rhythm. Grossly normal heart sounds.  Good peripheral circulation. Respiratory: Normal respiratory effort.  No retractions. Lungs CTAB. Gastrointestinal: Soft and nontender. No distention. No abdominal bruits. No CVA tenderness. Genitourinary:  Musculoskeletal: No lower extremity tenderness nor edema.  No joint effusions. Neurologic:  CN- intact.  No facial droop, Normal FNF.  Normal heel to shin.  Sensation intact bilaterally. Normal speech and  language. No gross focal neurologic deficits are appreciated. No gait instability. Skin:  Skin is warm, dry and intact. No rash noted. Psychiatric: Mood and affect are normal. Speech and behavior are normal.  ____________________________________________   LABS (all labs ordered are listed, but only abnormal results are displayed)  No results found for this or any previous visit (from the past 24 hour(s)). ____________________________________________ ____________________________________________  RADIOLOGY  I personally reviewed all radiographic images ordered to evaluate for the above acute complaints and reviewed radiology reports and findings.  These findings were personally discussed with the patient.  Please see medical record for radiology report.  ____________________________________________   PROCEDURES  Procedure(s) performed:  Procedures    Critical Care performed: no ____________________________________________   INITIAL IMPRESSION / ASSESSMENT AND PLAN / ED COURSE  Pertinent labs & imaging results that were available during my care of the patient were reviewed by me and considered in my medical decision making (see chart for details).  DDX: Concussion, contusion, SDH, SAH, IPH, fracture  Terrence Middleton is a 69 y.o. who presents to the ED with presentation as described above.  CT imaging is fortunately reassuring.  His neuro exam is nonfocal and intact.  GCS 15.  Basic wound care provided.  We discussed conservative treatment and concussion precautions.  Do not feel that further diagnostic testing clinically warranted at this time.  Discussed outpatient follow-up.  Have discussed with the patient and available family all diagnostics and treatments performed thus far and all questions were answered to the best of my ability. The patient demonstrates understanding and agreement with plan.      The patient was evaluated in Emergency Department today for the  symptoms described in the history of present illness. He/she was evaluated in the context of the global COVID-19 pandemic, which necessitated consideration that the patient might be at risk for infection with the SARS-CoV-2 virus that causes COVID-19. Institutional protocols and algorithms that pertain to the evaluation of patients at risk for COVID-19 are in a state of rapid change based on information released by regulatory bodies including the CDC and federal and state organizations. These policies and algorithms were followed during the patient's care in the ED.  As part of my medical decision making, I reviewed the following data within the electronic MEDICAL RECORD NUMBER Nursing notes reviewed and incorporated, Labs reviewed, notes from prior ED visits and [place] Controlled Substance Database   ____________________________________________   FINAL CLINICAL IMPRESSION(S) / ED DIAGNOSES  Final diagnoses:  Injury of head, initial encounter      NEW MEDICATIONS STARTED DURING THIS VISIT:  New Prescriptions   BUTALBITAL-ACETAMINOPHEN-CAFFEINE (FIORICET) [phone] MG TABLET    Take 1 tablet by mouth every 6 (six) hours as needed for headache.   CYCLOBENZAPRINE (FLEXERIL) 5 MG TABLET    Take 1 tablet (5 mg total) by mouth at bedtime as needed for muscle spasms.     Note:  This document was prepared using Dragon voice recognition software and may include unintentional dictation errors.    Willy Eddy, MD 06/27/19 1544

## 2019-06-27 NOTE — Discharge Instructions (Signed)

## 2019-06-28 ENCOUNTER — Encounter: Payer: Self-pay | Admitting: Family Medicine

## 2019-06-28 DIAGNOSIS — S060X0A Concussion without loss of consciousness, initial encounter: Secondary | ICD-10-CM

## 2019-06-28 NOTE — Telephone Encounter (Signed)
Updated pt's chart.  Notified pt via MyChart.  

## 2019-06-28 NOTE — Telephone Encounter (Signed)
Neuro referral placed - he wants [facility] but I know it takes a while to get in with them.  Does sports medicine at [facility] have a concussion clinic? Otherwise may want to get him in with Dr Patsy Lager.Marland KitchenMarland Kitchen

## 2019-07-02 NOTE — Progress Notes (Signed)
Terrence Saldivar T. Christabella Alvira, MD, CAQ Sports Medicine  Primary Care and Sports Medicine [facility] [address] [place], [address]  Phone: [phone]  FAX: [phone]  Terrence Middleton - 69 y.o. male  MRN [id number]  Date of Birth: [birth year]  Date: 07/03/2019  PCP: Eustaquio Boyden, MD  Referral: Eustaquio Boyden, MD  Chief Complaint  Patient presents with  . Concussion    This visit occurred during the SARS-CoV-2 public health emergency.  Safety protocols were in place, including screening questions prior to the visit, additional usage of staff PPE, and extensive cleaning of exam room while observing appropriate contact time as indicated for disinfecting solutions.   Subjective:   Dear Dr. Sharen Hones,  Thank you for having me see Terrence Middleton in consultation today at [facility] for his problem with concussion after 2 head strikes and 36 hours..  As you may recall, he is a 69 y.o. year old male without a history of neurological or psychiatric prior problems.  The patient presents with acute concussion status post head strike x2 over the weekend.  He had 2 distinct head injuries in a 36-hour timeframe.  On chart review, initially while in his shed some scaffolding fell and hit his head.  He hit the lateral side of his head on the left and did have some small degree of bleeding but did not have loss of consciousness.  Was feeling moderately okay but he otherwise did have a headache and he felt like he could do some more work.  Unfortunately his scaffolding fell on him again and his head fell and struck the ground.  He did have a CT of the head and a CT of the cervical spine.  I reviewed the patient's CT of his head without contrast myself and the patient has no signs of intracranial hemorrhage. Electronically Signed  By: Hannah Beat, MD On: 07/03/2019 10:40 AM EDT   He was placed on concussion precautions and  my partner Dr. Sharen Hones recommended that he follow-up with me.  He has been able to drive without difficulty.  He has been able to use screens without any difficulty as well.  He does not feel entirely himself and he is experiencing some significant fogginess and slowed down sensation.  He is sleeping okay.  Complicating features: 2007, had a fractured vertebrae and significant concussion 1 1/2 to start recovery - ? Unsure about his recovery.  He thinks that he recovered in approximately 2 weeks. He denies neurological, psychiatric, or prior learning disabilities.  Retired Art gallery manager.  Scat 5: Symptom evaluation: Number of symptoms: 19 out of 22 Symptom severity score: 48 out of 132 Subjectively 80% better and slowed by headaches and in a fog  Cognitive screening: Orientation 5/5  Immediate memory: 15/15  Concentration: 4 out of 5  Cranial nerves II through XII are intact Near point convergence is normal. Saccades and pursuits causes no induction of symptoms.  BESS testing: Feet together: 0 Standing on 1 foot: 2 Tandem: 3 Total score: 5    1 foot, f 2 Tandem, fell -    Patient Active Problem List   Diagnosis Date Noted  . Dyspnea on exertion 04/08/2019  . Health maintenance examination 01/29/2019  . Low serum vitamin B12 01/21/2019  . Advanced care planning/counseling discussion 01/19/2018  . PVD (posterior vitreous detachment), left 09/04/2015  . Anosmia 01/26/2015  . Right low back pain 01/26/2015  . Asthmatic bronchitis , chronic (HCC)  03/09/2013  . Dyslipidemia 11/12/2012  . Medicare annual wellness visit, initial 11/04/2011  . Allergy to insect stings 02/10/2011  . COLONIC POLYPS, HX OF 06/23/2009  . ALLERGIC RHINITIS 02/14/2008  . GERD 02/14/2008    Past Medical History:  Diagnosis Date  . ALLERGIC RHINITIS 02/14/2008   Young MD, Joni Fears D   . Allergy to insect stings 02/10/2011   Hymenoptera sting   . Asthma    exercise induced per patient Maple Hudson)   . GERD (gastroesophageal reflux disease)   . PVD (posterior vitreous detachment), left     Past Surgical History:  Procedure Laterality Date  . cervical fracture  2008   fractured c3 after fall  . COLONOSCOPY  05/2006   colon polyps Juanda Chance)  . COLONOSCOPY  11/2013   WNL Juanda Chance)    Family History  Problem Relation Age of Onset  . Asthma Mother   . COPD Mother   . Emphysema Father   . Lymphoma Father 69  . Crohn's disease Son        Colonectomy  . Pulmonary disease Son 34       passed away - Hermansky-Pudlak syndrome  . Colon cancer Neg Hx      Review of Systems is noted in the HPI, as appropriate  In addition, a 10 point review of systems has been performed and is negative except where noted positive.  Objective:   BP 118/70   Pulse 61   Temp 98.4 F (36.9 C) (Temporal)   Ht 5' 9.5" (1.765 m)   Wt 212 lb 12 oz (96.5 kg)   SpO2 96%   BMI 30.97 kg/m     Neuro: CN 2-12 grossly intact. PERRLA. EOMI. Sensation intact throughout. Str 5/5 all extremities. DTR 2+. No clonus. A and o x 4. Romberg neg. Finger nose neg. Heel -shin neg.   PSYCH: Normally interactive. Conversant. Not depressed or anxious appearing.  Calm demeanor.    The remainder of the patient's neurological and concussion examination per above.  Radiology:  CT Head Wo Contrast  Result Date: 06/27/2019 CLINICAL DATA:  Terrence Middleton to the left side of the head with a laceration when the patient was struck with metal scaffolding 06/25/2019. Initial encounter. EXAM: CT HEAD WITHOUT CONTRAST CT CERVICAL SPINE WITHOUT CONTRAST TECHNIQUE: Multidetector CT imaging of the head and cervical spine was performed following the standard protocol without intravenous contrast. Multiplanar CT image reconstructions of the cervical spine were also generated. COMPARISON:  Head and cervical spine CT scan 01/03/2006. FINDINGS: CT HEAD FINDINGS Brain: No evidence of acute infarction, hemorrhage, hydrocephalus, extra-axial collection or  mass lesion/mass effect. Dilated perivascular space in the left basal ganglia is unchanged. Vascular: No hyperdense vessel or unexpected calcification. Skull: Intact.  No focal lesion. Sinuses/Orbits: Negative. Other: None. CT CERVICAL SPINE FINDINGS Alignment: Normal. Skull base and vertebrae: No acute fracture. No primary bone lesion or focal pathologic process. Soft tissues and spinal canal: No prevertebral fluid or swelling. No visible canal hematoma. Disc levels: Loss of disc space height is seen at C3-4, C5-6 and C6-7. Degenerative change has mildly progressed since the prior exam. Upper chest: Lung apices clear. Other: None. IMPRESSION: No acute abnormality head or cervical spine. Degenerative disc disease of the cervical spine shows mild progression since 2007. Electronically Signed   By: Drusilla Kanner M.D.   On: 06/27/2019 13:57   CT Cervical Spine Wo Contrast  Result Date: 06/27/2019 CLINICAL DATA:  Terrence Middleton to the left side of the head with a laceration when  the patient was struck with metal scaffolding 06/25/2019. Initial encounter. EXAM: CT HEAD WITHOUT CONTRAST CT CERVICAL SPINE WITHOUT CONTRAST TECHNIQUE: Multidetector CT imaging of the head and cervical spine was performed following the standard protocol without intravenous contrast. Multiplanar CT image reconstructions of the cervical spine were also generated. COMPARISON:  Head and cervical spine CT scan 01/03/2006. FINDINGS: CT HEAD FINDINGS Brain: No evidence of acute infarction, hemorrhage, hydrocephalus, extra-axial collection or mass lesion/mass effect. Dilated perivascular space in the left basal ganglia is unchanged. Vascular: No hyperdense vessel or unexpected calcification. Skull: Intact.  No focal lesion. Sinuses/Orbits: Negative. Other: None. CT CERVICAL SPINE FINDINGS Alignment: Normal. Skull base and vertebrae: No acute fracture. No primary bone lesion or focal pathologic process. Soft tissues and spinal canal: No prevertebral fluid  or swelling. No visible canal hematoma. Disc levels: Loss of disc space height is seen at C3-4, C5-6 and C6-7. Degenerative change has mildly progressed since the prior exam. Upper chest: Lung apices clear. Other: None. IMPRESSION: No acute abnormality head or cervical spine. Degenerative disc disease of the cervical spine shows mild progression since 2007. Electronically Signed   By: Inge Rise M.D.   On: 06/27/2019 13:57    Assessment and Plan:     ICD-10-CM   1. Mild traumatic brain injury, without loss of consciousness, initial encounter ([place])  S06.9X0A    Total encounter time: 60 minutes. On the day of the patient encounter, this can include review of prior records, labs, and imaging.  Additional time can include counselling, consultation with peer MD in person or by telephone.  This also includes independent review of Radiology.  Additional time spent on chart review, radiology, answering all of the patient's questions as well as review of concussion in general, his risk factors, and questions regarding future implications.  He is recovering reasonably well.  On discussion with him I suspect that his symptoms are relatively better than his self symptom checklist.  Examination is relatively good.  He is retired.  Advance physical and mental exertion as able and slowly. He should be fine driving an automobile.  We discussed that by nature, it is difficult to determine exact recovery times for concussion.  Sometimes people recover in a short period of time, but this can also be months or even potentially years involving postconcussive syndrome.  With 2 strikes to his ahead within a very short time period, I urged him at all cost to avoid an additional head injury.  Patient Instructions  To help reduce HEADACHES: Coenzyme Q10 160mg  ONCE DAILY Riboflavin/Vitamin B2 400mg  ONCE DAILY Magnesium oxide 400mg  ONCE - TWICE DAILY May stop after headaches are resolved.                                                                                                To help with INSOMNIA: Melatonin 5 - 10 mg AT BEDTIME Tart cherry extract, any dose at night (works well for gout)   Other medicines to help decrease inflammation Turmeric 500mg  once to twice daily     Mild Traumatic Brain Injury (CONCUSSSION):  Owens Loffler, MD, [place]  Medicine Adapted from [facility], Concussion [facility], and [employer]  Think of the human brain almost as an egg yolk and your skull as an egg shell. When your head or body takes a hit, it can cause your brain to shake around inside your skull, injuring the brain. A concussion is not only caused by a hit to the head, but can also be caused by an impact to the body that ends up shaking your brain in your skull, such as whiplash.  A common misconception about concussion is that one loses consciousness. However, loss of consciousness occurs in only less than 10% of concussion cases.  Concussive symptoms typically resolve in 7 to 10 days (sports-related concussions) or within 3 months (non-athletes).  Approximately 20% of people have symptoms after 6 weeks.  With concussions, we have learned that it generally takes youth longer to recover from concussions. Another thing that we now know about concussions is that it usually takes male athletes longer to recover than male athletes.  No two concussions are identical. In fact, there are at least six different clinical trajectories that concussions may take.  Signs and Symptoms of Concussion:  The signs and symptoms of a concussion are incredibly important because a concussion doesn't show up on imaging like an x-ray, CT, or MRI scan and there is no objective test, like a blood or saliva test, that can determine if a patient has a concussion. A doctor makes a concussion diagnosis based on the results of a comprehensive examination, which includes observing signs of concussion and  patients reporting symptoms of concussion appearing after an impact to the head or body. Concussion signs and symptoms are the brain's way of showing it is injured and not functioning normally.  TREATMENT:  THE MOST IMPORTANT THING IS REST AS SOON AS POSSIBLE AFTER INJURY SO THAT THE BRAIN CAN RECOVER. COMPLETE PHYSICAL AND MENTAL REST ARE NEEDED INITIALLY.   THAT MEANS: NO SCHOOL OR WORK FOR AT LEAST 3 DAYS AND CLEARED BY YOUR DOCTOR NO MENTAL EXERTION, MEANING NO WORK, NO HOMEWORK, NO TEST TAKING.  Avoid situations with loud noise, bright lights, or crowds. However, this doesn't mean isolate yourself in a dark room for a week. Too much isolation and boredom can be harmful, contributing to feelings of anxiety, depression, and resulting in increased recovery time. Spend time with friends and family, but monitor your symptoms and avoid situations that make you feel worse.   NO VIDEO GAMES, NO USING THE COMPUTER, NO TEXTING, NO USING SMARTPHONES, NO USE OF AN IPAD OR TABLET. DO NOT GO TO A MOVIE THEATRE OR WATCH SPORTS ON TV. HDTV TENDS TO MAKE PEOPLE FEEL WORSE.   ON APPROXIMATELY DAY 4, BEGIN VERY LIGHT EXERCISE SUCH AS WALKING. DO NOT START ANY STRENUOUS EXERCISE.   You will be given return to school or return to work recommendations.    CONCUSSION SIGNS  Concussion signs are what someone could observe about you to determine if you have a concussion.   Common concussion signs include:  Loss of consciousness Problems with balance Glazed look in the eyes Amnesia Delayed response to questions Forgetting an instruction, confusion about an assignment or position, or  confusion of the game, score, or opponent Inappropriate crying Inappropriate laughter Vomiting  CONCUSSION SYMPTOMS  Concussion symptoms are what someone who is concussed will tell you that they are experiencing. Concussion symptoms typically fall into six major categories:  1- Somatic (Physical)  Symptoms  Headache Light-headedness Dizziness Nausea Sensitivity to  light Sensitivity to noise  2- Cognitive Symptoms  Difficulties with attention Memory problems Loss of focus Difficulty multitasking Difficulty completing mental tasks  3- Sleep Symptoms  Sleeping more than usual Sleeping less than usual Having trouble falling asleep  4- Emotional Symptoms  Anxiety Depression Panic attacks  5 - Vestibular Symptoms  The vestibular system is affected in nearly 60% of youth and adolescent athletes following a concussion.  But what is the vestibular system?  It's the sensory system that helps with your sense of balance and spatial orientation. Think of a gyroscope! With help from your inner ears, your vestibular system detects the motion or position of your head in space. It sends information to your brain that's needed for balance and stable vision.  Need an example? If you're moving and looking at moving objects at the same time (think riding in a car), you're able to stay focused and not lose visual clarity. During a vestibular concussion, your gyroscope isn't working at full potential.  Difficulty with balance Dizziness. It may feel like the room is spinning or a slow, wavy sensation. (Like you're on a boat!) Trouble stabilizing vision when moving your head. (We call this Vestibular-Ocular Reflux, or VOR.) Think back to riding in a car. With a vestibular concussion, you can't stay focused. (The technical name for this is Visual Motion Sensitivity, or VMS.) Triggers  These 3 things could bring on vestibular symptoms:  Dynamic movements Busy environments, like the grocery store Crowds  6 - Oculo-motor  A concussion that affects the ocular, or visual, system of the brain. Typically, patients with ocular-motor concussions report pressure headaches in the front of their head, feeling more tired than normal, and becoming more symptomatic doing math or science  exercises at school.  Patients also experience difficulties with their eyes working together. Follow along to explore some of the most common symptoms.  Convergence  The eyes converge when viewing objects up close, such as with reading. With convergence problems, patients may see a double image as a target moves closer to them. Typically, without a concussion, objects can be brought very close without doubling.  Accommodation  Accommodation problems cause an object to become blurry as it is viewed up close. Accommodative and convergence problems are often experienced together and can impact reading and other near-vision activities.  Pursuits and Saccades  The eyes use pursuit eye movements to follow objects; while saccade eyes movements allow the eyes to shift rapidly from one object to another. Tracking objects, reading a book, scrolling on a computer or even watching for a moving car while crossing the street can be difficult when patients have problems with pursuit and saccade eye movements.  Misalignment  When people with eye misalignment (one eye drifts, eyes aren't perfectly aligned) sustain a concussion, the brain may have difficulty compensating for the misalignment like it once did. This can result in blurry vision, difficulty taking notes in class and focusing on the chalkboard.  Note: This is not an exhaustive list of concussion signs and symptoms, and it may take a few days for concussion symptoms to appear after the initial injury.    Follow-up: 2 weeks  New Prescriptions   No medications on file   Modified Medications   No medications on file   No orders of the defined types were placed in this encounter.   We will see the patient back in No follow-ups on file.. If not noted, then follow-up as needed.   Thank you for having  [place] see Terrence Middleton in consultation.  Feel free to contact me with any questions regarding his care.  Signed,  Elpidio Galea. Riker Collier,  MD   Patient's Medications  New Prescriptions   No medications on file  Previous Medications   ALBUTEROL (PROAIR HFA) 108 (90 BASE) MCG/ACT INHALER    INHALE 2 PUFFS INTO THE LUNGS EVERY 4 (FOUR) HOURS AS NEEDED FOR WHEEZING OR SHORTNESS OF BREATH.   BUTALBITAL-ACETAMINOPHEN-CAFFEINE (FIORICET) 50-325-40 MG TABLET    Take 1 tablet by mouth every 6 (six) hours as needed for headache.   CYCLOBENZAPRINE (FLEXERIL) 5 MG TABLET    Take 1 tablet (5 mg total) by mouth at bedtime as needed for muscle spasms.   DICYCLOMINE (BENTYL) 10 MG CAPSULE    TAKE 1 CAPSULE (10 MG TOTAL) BY MOUTH 2 (TWO) TIMES DAILY AS NEEDED FOR SPASMS.   EPINEPHRINE 0.3 MG/0.3 ML IJ SOAJ INJECTION    Inject into thigh for severe allergic reaction   FAMOTIDINE (PEPCID) 20 MG TABLET    Take 20 mg by mouth daily.   MULTIPLE VITAMINS-MINERALS (MULTIVITAMIN MEN PO)    Take 1 tablet by mouth daily.   OMEGA-3 FATTY ACIDS (OMEGA 3 PO)    Take 2 tablets by mouth daily.   SODIUM CHLORIDE-SODIUM BICARB (AYR SALINE NASAL RINSE) 1.57 G PACK    Place into the nose. Uses every morning   TIOTROPIUM BROMIDE-OLODATEROL (STIOLTO RESPIMAT) 2.5-2.5 MCG/ACT AERS    Inhale 2 puffs into the lungs daily.  Modified Medications   No medications on file  Discontinued Medications   UMECLIDINIUM-VILANTEROL (ANORO ELLIPTA) 62.5-25 MCG/INH AEPB    INHALE 1 PUFF INTO THE LUNGS DAILY.

## 2019-07-03 ENCOUNTER — Ambulatory Visit: Payer: Medicare PPO | Admitting: Family Medicine

## 2019-07-03 ENCOUNTER — Other Ambulatory Visit: Payer: Self-pay

## 2019-07-03 ENCOUNTER — Encounter: Payer: Self-pay | Admitting: Family Medicine

## 2019-07-03 VITALS — BP 118/70 | HR 61 | Temp 98.4°F | Ht 69.5 in | Wt 212.8 lb

## 2019-07-03 DIAGNOSIS — S069X0A Unspecified intracranial injury without loss of consciousness, initial encounter: Secondary | ICD-10-CM | POA: Diagnosis not present

## 2019-07-03 NOTE — Patient Instructions (Signed)
To help reduce HEADACHES: 1. Coenzyme Q10 160mg  ONCE DAILY 2. Riboflavin/Vitamin B2 400mg  ONCE DAILY 3. Magnesium oxide 400mg  ONCE - TWICE DAILY May stop after headaches are resolved.                                                                                               To help with INSOMNIA: 1. Melatonin 5 - 10 mg AT BEDTIME 2. Tart cherry extract, any dose at night (works well for gout)   Other medicines to help decrease inflammation 1. Turmeric 500mg  once to twice daily     Mild Traumatic Brain Injury (CONCUSSSION):  , MD, [facility] Adapted from [facility], , and [employer]  Think of the human brain almost as an egg yolk and your skull as an egg shell. When your head or body takes a hit, it can cause your brain to shake around inside your skull, injuring the brain. A concussion is not only caused by a hit to the head, but can also be caused by an impact to the body that ends up shaking your brain in your skull, such as whiplash.  A common misconception about concussion is that one loses consciousness. However, loss of consciousness occurs in only less than 10% of concussion cases.  Concussive symptoms typically resolve in 7 to 10 days (sports-related concussions) or within 3 months (non-athletes).  Approximately 20% of people have symptoms after 6 weeks.  With concussions, we have learned that it generally takes youth longer to recover from concussions. Another thing that we now know about concussions is that it usually takes male athletes longer to recover than male athletes.  No two concussions are identical. In fact, there are at least six different clinical trajectories that concussions may take.  Signs and Symptoms of Concussion:  The signs and symptoms of a concussion are incredibly important because a concussion doesn't show up on imaging like an x-ray, CT, or MRI scan and there is no objective test,  like a blood or saliva test, that can determine if a patient has a concussion. A doctor makes a concussion diagnosis based on the results of a comprehensive examination, which includes observing signs of concussion and patients reporting symptoms of concussion appearing after an impact to the head or body. Concussion signs and symptoms are the brain's way of showing it is injured and not functioning normally.  TREATMENT:  THE MOST IMPORTANT THING IS REST AS SOON AS POSSIBLE AFTER INJURY SO THAT THE BRAIN CAN RECOVER. COMPLETE PHYSICAL AND MENTAL REST ARE NEEDED INITIALLY.   THAT MEANS: NO SCHOOL OR WORK FOR AT LEAST 3 DAYS AND CLEARED BY YOUR DOCTOR NO MENTAL EXERTION, MEANING NO WORK, NO HOMEWORK, NO TEST TAKING.  Avoid situations with loud noise, bright lights, or crowds. However, this doesn't mean isolate yourself in a dark room for a week. Too much isolation and boredom can be harmful, contributing to feelings of anxiety, depression, and resulting in increased recovery time. Spend time with friends and family, but monitor your symptoms and avoid situations that make you feel worse.   NO  VIDEO GAMES, NO USING THE COMPUTER, NO TEXTING, NO USING SMARTPHONES, NO USE OF AN IPAD OR TABLET. DO NOT GO TO A MOVIE THEATRE OR WATCH SPORTS ON TV. HDTV TENDS TO MAKE PEOPLE FEEL WORSE.   ON APPROXIMATELY DAY 4, BEGIN VERY LIGHT EXERCISE SUCH AS WALKING. DO NOT START ANY STRENUOUS EXERCISE.   You will be given return to school or return to work recommendations.    CONCUSSION SIGNS  Concussion signs are what someone could observe about you to determine if you have a concussion.   Common concussion signs include:  Loss of consciousness Problems with balance Glazed look in the eyes Amnesia Delayed response to questions Forgetting an instruction, confusion about an assignment or position, or  confusion of the game, score, or opponent Inappropriate crying Inappropriate  laughter Vomiting  CONCUSSION SYMPTOMS  Concussion symptoms are what someone who is concussed will tell you that they are experiencing. Concussion symptoms typically fall into six major categories:  1- Somatic (Physical) Symptoms  Headache Light-headedness Dizziness Nausea Sensitivity to light Sensitivity to noise  2- Cognitive Symptoms  Difficulties with attention Memory problems Loss of focus Difficulty multitasking Difficulty completing mental tasks  3- Sleep Symptoms  Sleeping more than usual Sleeping less than usual Having trouble falling asleep  4- Emotional Symptoms  Anxiety Depression Panic attacks  5 - Vestibular Symptoms  The vestibular system is affected in nearly 60% of youth and adolescent athletes following a concussion.  But what is the vestibular system?  It's the sensory system that helps with your sense of balance and spatial orientation. Think of a gyroscope! With help from your inner ears, your vestibular system detects the motion or position of your head in space. It sends information to your brain that's needed for balance and stable vision.  Need an example? If you're moving and looking at moving objects at the same time (think riding in a car), you're able to stay focused and not lose visual clarity. During a vestibular concussion, your gyroscope isn't working at full potential.  Difficulty with balance Dizziness. It may feel like the room is spinning or a slow, wavy sensation. (Like you're on a boat!) Trouble stabilizing vision when moving your head. (We call this Vestibular-Ocular Reflux, or VOR.) Think back to riding in a car. With a vestibular concussion, you can't stay focused. (The technical name for this is Visual Motion Sensitivity, or VMS.) Triggers  These 3 things could bring on vestibular symptoms:  Dynamic movements Busy environments, like the grocery store Crowds  6 - Oculo-motor  A concussion that affects the  ocular, or visual, system of the brain. Typically, patients with ocular-motor concussions report pressure headaches in the front of their head, feeling more tired than normal, and becoming more symptomatic doing math or science exercises at school.  Patients also experience difficulties with their eyes working together. Follow along to explore some of the most common symptoms.  Convergence  The eyes converge when viewing objects up close, such as with reading. With convergence problems, patients may see a double image as a target moves closer to them. Typically, without a concussion, objects can be brought very close without doubling.  Accommodation  Accommodation problems cause an object to become blurry as it is viewed up close. Accommodative and convergence problems are often experienced together and can impact reading and other near-vision activities.  Pursuits and Saccades  The eyes use pursuit eye movements to follow objects; while saccade eyes movements allow the eyes to shift rapidly  from one object to another. Tracking objects, reading a book, scrolling on a computer or even watching for a moving car while crossing the street can be difficult when patients have problems with pursuit and saccade eye movements.  Misalignment  When people with eye misalignment (one eye drifts, eyes aren't perfectly aligned) sustain a concussion, the brain may have difficulty compensating for the misalignment like it once did. This can result in blurry vision, difficulty taking notes in class and focusing on the chalkboard.  Note: This is not an exhaustive list of concussion signs and symptoms, and it may take a few days for concussion symptoms to appear after the initial injury.

## 2019-07-17 ENCOUNTER — Ambulatory Visit: Payer: Medicare PPO | Admitting: Family Medicine

## 2019-07-17 ENCOUNTER — Other Ambulatory Visit: Payer: Self-pay

## 2019-07-17 ENCOUNTER — Encounter: Payer: Self-pay | Admitting: Family Medicine

## 2019-07-17 VITALS — BP 130/66 | HR 77 | Temp 98.5°F | Ht 69.5 in | Wt 215.2 lb

## 2019-07-17 DIAGNOSIS — S069X0D Unspecified intracranial injury without loss of consciousness, subsequent encounter: Secondary | ICD-10-CM

## 2019-07-17 NOTE — Progress Notes (Signed)
Schylar Wuebker T. Deandra Goering, MD, CAQ Sports Medicine  Primary Care and Sports Medicine [facility] [address] [place], [address]  Phone: [phone]  FAX: [phone]  Terrence Middleton - 69 y.o. male  MRN [id number]  Date of Birth: 10-19-1950  Date: 07/17/2019  PCP: Eustaquio Boyden, MD  Referral: Eustaquio Boyden, MD  Chief Complaint  Patient presents with  . Follow-up    Concussion    This visit occurred during the SARS-CoV-2 public health emergency.  Safety protocols were in place, including screening questions prior to the visit, additional usage of staff PPE, and extensive cleaning of exam room while observing appropriate contact time as indicated for disinfecting solutions.   Subjective:   Terrence Middleton is a 69 y.o. very pleasant male patient with Body mass index is 31.33 kg/m. who presents with the following:  F/u concussion:  He is doing much better compared to the first time I saw him.  He feels quite a bit better with some minor symptoms only.  For some of the things he rated He was unsure if he should rated at 0 because it is so minor.  Symptom evaluation: 11/22 Symptoms severity score: 11/132 cognitive: 5/5 Immediate memory 15/15 concentration: Digits backwards: 2/4 Months in reverse order 1/1 Concentration total 40/5  Reads normally, normal pain-free motion at the neck.  VOMS normal finger-nose normal tandem gait is normal.  Modified BESS: Double leg stance 0/10 Single leg stance 0/10 Tandem stance: 1/10  Cranial nerves II through XII are intact  Doing well globally and first wek was pretty rough but tylenol and ibuprofen helped.   Fullness / fogginess somewhat  Did the federal taxes without any difficulty.  Immunization History  Administered Date(s) Administered  . Fluad Quad(high Dose 65+) 01/22/2019  . Influenza Split 02/11/2011, 02/16/2012  . Influenza,inj,Quad PF,6+ Mos 11/12/2012, 11/20/2013,  11/24/2014, 11/27/2015, 12/27/2016, 01/19/2018  . PFIZER SARS-COV-2 Vaccination 04/13/2019, 05/04/2019  . Pneumococcal Conjugate-13 02/22/2016  . Pneumococcal Polysaccharide-23 02/19/2015  . Td 01/04/2006  . Tdap 11/27/2015, 06/27/2019  . Zoster 11/12/2012  . Zoster Recombinat (Shingrix) 03/04/2019     Review of Systems is noted in the HPI, as appropriate  Objective:   BP 130/66   Pulse 77   Temp 98.5 F (36.9 C) (Temporal)   Ht 5' 9.5" (1.765 m)   Wt 215 lb 4 oz (97.6 kg)   SpO2 97%   BMI 31.33 kg/m   GEN: No acute distress; alert,appropriate. PULM: Breathing comfortably in no respiratory distress PSYCH: Normally interactive.   Neuro above.  Laboratory and Imaging Data:  Assessment and Plan:     ICD-10-CM   1. Mild traumatic brain injury, without loss of consciousness, subsequent encounter  S06.9X0D    Recovering quite well.  Return to sport and mental activity as he can tolerate.  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  At this point I think he can follow-up with me only as needed.  Follow-up: No follow-ups on file.  No orders of the defined types were placed in this encounter.  There are no discontinued medications. No orders of the defined types were placed in this encounter.   Signed,  Elpidio Galea. Shamell Suarez, MD   Outpatient Encounter Medications as of 07/17/2019  Medication Sig  . albuterol (PROAIR HFA) 108 (90 Base) MCG/ACT inhaler INHALE 2 PUFFS INTO THE LUNGS EVERY 4 (FOUR) HOURS AS NEEDED FOR WHEEZING OR SHORTNESS OF BREATH.  . butalbital-acetaminophen-caffeine (FIORICET)  50-325-40 MG tablet Take 1 tablet by mouth every 6 (six) hours as needed for headache.  . cyclobenzaprine (FLEXERIL) 5 MG tablet Take 1 tablet (5 mg total) by mouth at bedtime as needed for muscle spasms.  Marland Kitchen dicyclomine (BENTYL) 10 MG capsule TAKE 1 CAPSULE (10 MG TOTAL) BY MOUTH 2 (TWO) TIMES DAILY AS NEEDED FOR SPASMS.  Marland Kitchen EPINEPHrine 0.3 mg/0.3 mL IJ SOAJ  injection Inject into thigh for severe allergic reaction  . famotidine (PEPCID) 20 MG tablet Take 20 mg by mouth daily.  . Multiple Vitamins-Minerals (MULTIVITAMIN MEN PO) Take 1 tablet by mouth daily.  . Omega-3 Fatty Acids (OMEGA 3 PO) Take 2 tablets by mouth daily.  . Sodium Chloride-Sodium Bicarb (AYR SALINE NASAL RINSE) 1.57 G PACK Place into the nose. Uses every morning  . Tiotropium Bromide-Olodaterol (STIOLTO RESPIMAT) 2.5-2.5 MCG/ACT AERS Inhale 2 puffs into the lungs daily.   No facility-administered encounter medications on file as of 07/17/2019.

## 2019-12-06 DIAGNOSIS — Z20822 Contact with and (suspected) exposure to covid-19: Secondary | ICD-10-CM | POA: Diagnosis not present

## 2019-12-14 ENCOUNTER — Encounter: Payer: Self-pay | Admitting: Family Medicine

## 2020-01-22 DIAGNOSIS — L820 Inflamed seborrheic keratosis: Secondary | ICD-10-CM | POA: Diagnosis not present

## 2020-01-22 DIAGNOSIS — D225 Melanocytic nevi of trunk: Secondary | ICD-10-CM | POA: Diagnosis not present

## 2020-01-22 DIAGNOSIS — D1801 Hemangioma of skin and subcutaneous tissue: Secondary | ICD-10-CM | POA: Diagnosis not present

## 2020-01-22 DIAGNOSIS — L812 Freckles: Secondary | ICD-10-CM | POA: Diagnosis not present

## 2020-01-22 DIAGNOSIS — Z85828 Personal history of other malignant neoplasm of skin: Secondary | ICD-10-CM | POA: Diagnosis not present

## 2020-01-22 DIAGNOSIS — L821 Other seborrheic keratosis: Secondary | ICD-10-CM | POA: Diagnosis not present

## 2020-01-29 ENCOUNTER — Telehealth: Payer: Self-pay | Admitting: Family Medicine

## 2020-01-29 NOTE — Telephone Encounter (Signed)
Pt left v/m requesting cb; I called pt and left v/m asking pt to call [phone] option 4.

## 2020-01-29 NOTE — Telephone Encounter (Signed)
Pt sent in following mychart message: "Annual physical. Also discuss some pain in lower side/abdominal area" Please triage patient in regards to abdominal pain to see severity and other symptoms.

## 2020-01-29 NOTE — Telephone Encounter (Addendum)
I tried calling pt again and left v/m asking pt to cb [phone]. I advised in v/m due to lateness of day and holiday coming up if pt is having abd pain that needs immediate attention to go to UC or ED. Pt can call back to [facility] and if after 5 PM can speak with oncall nurse or if pt is not having problem pt can call back on 02/03/20 when [facility] reopens and speak with triage.Sending note to DR Para March as Lorain Childes and Ileene Rubens RN.

## 2020-01-29 NOTE — Telephone Encounter (Signed)
Unable to reach pt by phone; left v/m requesting cb to [phone] option 4 and can speak with anyone in triage.

## 2020-01-29 NOTE — Telephone Encounter (Signed)
Pt left v/m that he would try to cb next week. I called pt and got v/m again and left another v/m that if needed prior to office opening on 02/03/20 pt can call [phone] and speak with on call nurses or if immediate or urgent need can go to UC or ED and if no problem can call [facility] on 02/03/20 and speak with triage nurse.

## 2020-02-03 NOTE — Telephone Encounter (Signed)
Called and spoke with Terrence Middleton regarding his abdominal pain. Terrence Middleton stated that the abdominal pain is not constant and only occurs at night. Terrence Middleton reports that he is taking naproxen. Terrence Middleton states that he has been having this pain for a while and seen Dr. Sharen Hones last November for his abdominal pain at his CPE. Terrence Middleton refused to go to [facility] or ED. UC and ED precautions given. Terrence Middleton verbalized understanding and stated that he just wanted to see Dr. Sharen Hones to discuss this problem. Terrence Middleton scheduled for office visit with Dr. Sharen Hones for Monday (02/10/2020) at 1100. Terrence Middleton states that he is interested in getting his lab work done on this day and will schedule his CPE at the time of the visit.

## 2020-02-10 ENCOUNTER — Encounter: Payer: Self-pay | Admitting: Family Medicine

## 2020-02-10 ENCOUNTER — Ambulatory Visit: Payer: Medicare PPO | Admitting: Family Medicine

## 2020-02-10 ENCOUNTER — Other Ambulatory Visit: Payer: Self-pay

## 2020-02-10 VITALS — BP 120/70 | HR 66 | Temp 97.8°F | Ht 69.5 in | Wt 212.2 lb

## 2020-02-10 DIAGNOSIS — G8929 Other chronic pain: Secondary | ICD-10-CM

## 2020-02-10 DIAGNOSIS — E785 Hyperlipidemia, unspecified: Secondary | ICD-10-CM | POA: Diagnosis not present

## 2020-02-10 DIAGNOSIS — M5441 Lumbago with sciatica, right side: Secondary | ICD-10-CM | POA: Diagnosis not present

## 2020-02-10 DIAGNOSIS — Z125 Encounter for screening for malignant neoplasm of prostate: Secondary | ICD-10-CM | POA: Diagnosis not present

## 2020-02-10 DIAGNOSIS — R339 Retention of urine, unspecified: Secondary | ICD-10-CM

## 2020-02-10 DIAGNOSIS — R739 Hyperglycemia, unspecified: Secondary | ICD-10-CM | POA: Diagnosis not present

## 2020-02-10 DIAGNOSIS — N400 Enlarged prostate without lower urinary tract symptoms: Secondary | ICD-10-CM | POA: Insufficient documentation

## 2020-02-10 DIAGNOSIS — E538 Deficiency of other specified B group vitamins: Secondary | ICD-10-CM | POA: Diagnosis not present

## 2020-02-10 DIAGNOSIS — R1031 Right lower quadrant pain: Secondary | ICD-10-CM | POA: Diagnosis not present

## 2020-02-10 LAB — CBC WITH DIFFERENTIAL/PLATELET
Basophils Absolute: 0 10*3/uL (ref 0.0–0.1)
Basophils Relative: 0.8 % (ref 0.0–3.0)
Eosinophils Absolute: 0.1 10*3/uL (ref 0.0–0.7)
Eosinophils Relative: 3 % (ref 0.0–5.0)
HCT: 43 % (ref 39.0–52.0)
Hemoglobin: 14.4 g/dL (ref 13.0–17.0)
Lymphocytes Relative: 33.6 % (ref 12.0–46.0)
Lymphs Abs: 1.7 10*3/uL (ref 0.7–4.0)
MCHC: 33.5 g/dL (ref 30.0–36.0)
MCV: 78.7 fl (ref 78.0–100.0)
Monocytes Absolute: 0.6 10*3/uL (ref 0.1–1.0)
Monocytes Relative: 11.3 % (ref 3.0–12.0)
Neutro Abs: 2.5 10*3/uL (ref 1.4–7.7)
Neutrophils Relative %: 51.3 % (ref 43.0–77.0)
Platelets: 239 10*3/uL (ref 150.0–400.0)
RBC: 5.46 Mil/uL (ref 4.22–5.81)
RDW: 13 % (ref 11.5–15.5)
WBC: 5 10*3/uL (ref 4.0–10.5)

## 2020-02-10 LAB — POC URINALSYSI DIPSTICK (AUTOMATED)
Bilirubin, UA: NEGATIVE
Blood, UA: NEGATIVE
Glucose, UA: NEGATIVE
Ketones, UA: NEGATIVE
Leukocytes, UA: NEGATIVE
Nitrite, UA: NEGATIVE
Protein, UA: NEGATIVE
Spec Grav, UA: 1.025 (ref 1.010–1.025)
Urobilinogen, UA: 0.2 E.U./dL
pH, UA: 6 (ref 5.0–8.0)

## 2020-02-10 LAB — COMPREHENSIVE METABOLIC PANEL
ALT: 15 U/L (ref 0–53)
AST: 15 U/L (ref 0–37)
Albumin: 4.3 g/dL (ref 3.5–5.2)
Alkaline Phosphatase: 42 U/L (ref 39–117)
BUN: 18 mg/dL (ref 6–23)
CO2: 27 mEq/L (ref 19–32)
Calcium: 8.8 mg/dL (ref 8.4–10.5)
Chloride: 104 mEq/L (ref 96–112)
Creatinine, Ser: 1 mg/dL (ref 0.40–1.50)
GFR: 77.78 mL/min (ref >60.00)
Glucose, Bld: 80 mg/dL (ref 70–99)
Potassium: 4.4 mEq/L (ref 3.5–5.1)
Sodium: 138 mEq/L (ref 135–145)
Total Bilirubin: 0.9 mg/dL (ref 0.2–1.2)
Total Protein: 6.6 g/dL (ref 6.0–8.3)

## 2020-02-10 LAB — VITAMIN B12: Vitamin B-12: 355 pg/mL (ref 211–911)

## 2020-02-10 LAB — LIPID PANEL
Cholesterol: 145 mg/dL (ref 0–200)
HDL: 42.1 mg/dL (ref >39.00)
LDL Cholesterol: 96 mg/dL (ref 0–99)
NonHDL: 102.91
Total CHOL/HDL Ratio: 3
Triglycerides: 35 mg/dL (ref 0.0–149.0)
VLDL: 7 mg/dL (ref 0.0–40.0)

## 2020-02-10 LAB — HEMOGLOBIN A1C: Hgb A1c MFr Bld: 5.8 % (ref 4.6–6.5)

## 2020-02-10 LAB — PSA: PSA: 1.68 ng/mL (ref 0.10–4.00)

## 2020-02-10 NOTE — Patient Instructions (Addendum)
Labs today. Urinalysis looked ok today.  Good to see you today  Do exercises provided for possible hip bursitis and sciatica.  Ok to continue [place] as needed.  Let us know if not improving for physical therapy referral.  For bladder - get plenty of fluids, avoid bladder irritants like spicy foods, caffeine, dark soda.  Schedule physical over the next 1-2 months.

## 2020-02-10 NOTE — Assessment & Plan Note (Addendum)
UA today normal.  Check DRE at physical to evaluate for BPH.

## 2020-02-10 NOTE — Assessment & Plan Note (Addendum)
Describes several different causes including R trochanteric bursitis, R sacroilitis and R sciatica. Exam not consistent with hip arthritis bursitis or sciatica, most consistent with R sacroiliitis. Will provide with exercises from [facility] pt advisor for sacroiliac joint dysfunction as well as hip bursitis. Discussed PT referral if not improving with above. Discussed aleve use. Anticipate component of R lumbar radiculopathy

## 2020-02-10 NOTE — Assessment & Plan Note (Addendum)
Doesn't have true RLQ abd pain but rather sense of pressure/fullness when he has R leg symptoms. See below.

## 2020-02-10 NOTE — Progress Notes (Signed)
Patient ID: Terrence Middleton, male    DOB: March 24, 1950, 69 y.o.   MRN: [id number]  This visit was conducted in person.  BP 120/70 (BP Location: Left Arm, Patient Position: Sitting, Cuff Size: Normal)   Pulse 66   Temp 97.8 F (36.6 C) (Temporal)   Ht 5' 9.5" (1.765 m)   Wt 212 lb 3 oz (96.2 kg)   SpO2 95%   BMI 30.89 kg/m    CC: abd pain  Subjective:   HPI: Terrence Middleton is a 69 y.o. male presenting on 02/10/2020 for Abdominal Pain (C/o RLQ pain.  Started a few weaks ago.  Feels pressure like bladder is full.  Urine stream slow to start. Has been seen for previously.  Wants to discuss naproxen prescribed for possible sciatica. )   ?sciatica down R leg initially more noticeable with long car rides, now even waking him up at night - this pain localizes to R lateral hip. He doesn't have true abdominal discomfort but it feels more like radiation from hip. Pain less present the more he's active. Denies inciting trauma, injury or falls.  Naprosyn helps (220mg  QHS). He does take pepcid nightly.   He also notes some urinary changes over the past year (weakening stream, not fully emptying until second urination) along with RLQ pressure feeling not true pain. This is more noticeable when he has R side/leg pain.   Denies fevers/chills, nausea/vomiting, diarrhea/constipation, normal stools. No blood in stool or urine. No weight loss, night sweats, fatigue.   Due for labs today for upcoming CPE. Labs ordered.  On stiolto and albuterol and epi pen through pulm.  Has been told has lumbar scoliosis.   Last colonoscopy 11/2013 WNL 12/2013)      Relevant past medical, surgical, family and social history reviewed and updated as indicated. Interim medical history since our last visit reviewed. Allergies and medications reviewed and updated. Outpatient Medications Prior to Visit  Medication Sig Dispense Refill  . albuterol (PROAIR HFA) 108 (90 Base) MCG/ACT inhaler INHALE 2 PUFFS INTO THE  LUNGS EVERY 4 (FOUR) HOURS AS NEEDED FOR WHEEZING OR SHORTNESS OF BREATH. 18 g 12  . EPINEPHrine 0.3 mg/0.3 mL IJ SOAJ injection Inject into thigh for severe allergic reaction 1 each 12  . famotidine (PEPCID) 20 MG tablet Take 20 mg by mouth daily.    . Multiple Vitamins-Minerals (MULTIVITAMIN MEN PO) Take 1 tablet by mouth daily.    . Omega-3 Fatty Acids (OMEGA 3 PO) Take 2 tablets by mouth daily.    . Sodium Chloride-Sodium Bicarb (AYR SALINE NASAL RINSE) 1.57 G PACK Place into the nose. Uses every morning    . Tiotropium Bromide-Olodaterol (STIOLTO RESPIMAT) 2.5-2.5 MCG/ACT AERS Inhale 2 puffs into the lungs daily. 4 g 5  . cyclobenzaprine (FLEXERIL) 5 MG tablet Take 1 tablet (5 mg total) by mouth at bedtime as needed for muscle spasms. 8 tablet 0  . dicyclomine (BENTYL) 10 MG capsule TAKE 1 CAPSULE (10 MG TOTAL) BY MOUTH 2 (TWO) TIMES DAILY AS NEEDED FOR SPASMS. 90 capsule 1  . butalbital-acetaminophen-caffeine (FIORICET) 50-325-40 MG tablet Take 1 tablet by mouth every 6 (six) hours as needed for headache. 8 tablet 0   No facility-administered medications prior to visit.     Per HPI unless specifically indicated in ROS section below Review of Systems Objective:  BP 120/70 (BP Location: Left Arm, Patient Position: Sitting, Cuff Size: Normal)   Pulse 66   Temp 97.8 F (36.6 C) (Temporal)  Ht 5' 9.5" (1.765 m)   Wt 212 lb 3 oz (96.2 kg)   SpO2 95%   BMI 30.89 kg/m   Wt Readings from Last 3 Encounters:  02/10/20 212 lb 3 oz (96.2 kg)  07/17/19 215 lb 4 oz (97.6 kg)  07/03/19 212 lb 12 oz (96.5 kg)    Ht Readings from Last 3 Encounters:  02/10/20 5' 9.5" (1.765 m)  07/17/19 5' 9.5" (1.765 m)  07/03/19 5' 9.5" (1.765 m)      Physical Exam Vitals and nursing note reviewed.  Constitutional:      Appearance: Normal appearance. He is not ill-appearing.  Eyes:     Extraocular Movements: Extraocular movements intact.     Pupils: Pupils are equal, round, and reactive to light.   Cardiovascular:     Rate and Rhythm: Normal rate and regular rhythm.     Pulses: Normal pulses.     Heart sounds: Normal heart sounds. No murmur heard.   Pulmonary:     Effort: Pulmonary effort is normal. No respiratory distress.     Breath sounds: Normal breath sounds. No wheezing, rhonchi or rales.  Abdominal:     General: Abdomen is flat. Bowel sounds are normal. There is no distension.     Palpations: Abdomen is soft. There is no mass.     Tenderness: There is no abdominal tenderness. There is no right CVA tenderness, left CVA tenderness, guarding or rebound.     Hernia: No hernia is present.  Musculoskeletal:        General: Normal range of motion.     Right lower leg: No edema.     Left lower leg: No edema.     Comments:  No pain midline spine Evidence of R lumbar scoliosis No paraspinous mm tenderness Neg SLR bilaterally. No pain with int/ext rotation at hip. Discomfort with FABER on right. No significant pain at GTB or sciatic notch bilaterally. Discomfort to R SIJ  Skin:    General: Skin is warm and dry.     Findings: No rash.  Neurological:     Mental Status: He is alert.  Psychiatric:        Mood and Affect: Mood normal.        Behavior: Behavior normal.       Results for orders placed or performed in visit on 02/10/20  POCT Urinalysis Dipstick (Automated)  Result Value Ref Range   Color, UA yellow    Clarity, UA clear    Glucose, UA Negative Negative   Bilirubin, UA negative    Ketones, UA negative    Spec Grav, UA 1.025 1.010 - 1.025   Blood, UA negative    pH, UA 6.0 5.0 - 8.0   Protein, UA Negative Negative   Urobilinogen, UA 0.2 0.2 or 1.0 E.U./dL   Nitrite, UA negative    Leukocytes, UA Negative Negative   Lab Results  Component Value Date   PSA 1.46 01/22/2019   PSA 1.44 12/25/2017   PSA 1.36 12/27/2016   Assessment & Plan:  This visit occurred during the SARS-CoV-2 public health emergency.  Safety protocols were in place, including  screening questions prior to the visit, additional usage of staff PPE, and extensive cleaning of exam room while observing appropriate contact time as indicated for disinfecting solutions.   Problem List Items Addressed This Visit    RLQ abdominal pain    Doesn't have true RLQ abd pain but rather sense of pressure/fullness when he has R leg  symptoms. See below.       Relevant Orders   Comprehensive metabolic panel   CBC with Differential/Platelet   POCT Urinalysis Dipstick (Automated) (Completed)   Right-sided back pain    Describes several different causes including R trochanteric bursitis, R sacroilitis and R sciatica. Exam not consistent with hip arthritis bursitis or sciatica, most consistent with R sacroiliitis. Will provide with exercises from [facility] pt advisor for sacroiliac joint dysfunction as well as hip bursitis. Discussed PT referral if not improving with above. Discussed aleve use. Anticipate component of R lumbar radiculopathy      Low serum vitamin B12   Relevant Orders   Vitamin B12   Incomplete bladder emptying - Primary    UA today normal.  Check DRE at physical to evaluate for BPH.       Dyslipidemia   Relevant Orders   Lipid panel   Comprehensive metabolic panel    Other Visit Diagnoses    Special screening for malignant neoplasm of prostate       Relevant Orders   PSA   Hyperglycemia       Relevant Orders   Hemoglobin A1c       No orders of the defined types were placed in this encounter.  Orders Placed This Encounter  Procedures  . Lipid panel  . Comprehensive metabolic panel  . PSA  . Vitamin B12  . CBC with Differential/Platelet  . Hemoglobin A1c  . POCT Urinalysis Dipstick (Automated)    Patient Instructions  Labs today. Urinalysis looked ok today.  Good to see you today  Do exercises provided for possible hip bursitis and sciatica.  Ok to continue [place] as needed.  Let us know if not improving for physical therapy referral.  For bladder  - get plenty of fluids, avoid bladder irritants like spicy foods, caffeine, dark soda.  Schedule physical over the next 1-2 months.    Follow up plan: Return if symptoms worsen or fail to improve.  Eustaquio Boyden, MD

## 2020-02-19 ENCOUNTER — Other Ambulatory Visit: Payer: Self-pay | Admitting: Internal Medicine

## 2020-02-21 NOTE — Progress Notes (Signed)
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 ------------------------------------------------  02/22/2019- Virtual Visit via Telephone Note  History of Present Illness: 69 year old male never smoker followed for asthma, allergic rhinitis, complicated by GERD, history of insect venom allergy, peripheral venous insufficiency (son died of Hermansky- Pudlak syndrome) Proair hfa, EpiPen (beekeeper),Stiolto No major issues. Not needing Anoro. Not much wheeze. Does note dyspnea and heart pounds more than he expects if running. 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 He isn't interested in Covid antibody test now to see if was covid.     Observations/Objective:   Assessment and Plan: Asthma mild intermittent- not needing maintenance inhaler now Dyspnea on exertion  Follow Up Instructions:   02/24/20- 69 year old male never smoker followed for asthma, allergic rhinitis, complicated by GERD, history of insect venom allergy, peripheral venous insufficiency, (son died of Hermansky- Pudlak syndrome) Proair hfa, EpiPen (beekeeper),Stiolto 2.5, Covid vax- 3 Phizer Flu vax- had Rarely needs rescue hfa. Not sure how much Stiolto helps- doesn't note need, but diligent using 2 puffs daily. We discussed rescue vs maintenance. 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 Little wheeze or cough. Showed me xray demonstrating scoliosis L-S spine. I explained this won't directly impact breathing. Low back pain is being followed.  Daily saline rinse helps nose.    ROS-see HPI + = positive Constitutional:   No-   weight loss, night sweats, fevers, chills, fatigue, lassitude. HEENT:   No-  headaches, difficulty swallowing, tooth/dental problems, sore throat,       No-  sneezing, itching, ear ache, nasal congestion, post nasal drip,  CV:  No-   chest pain, orthopnea, PND, swelling in lower extremities, anasarca, dizziness, palpitations Resp: +shortness of breath with exertion or at rest.                productive cough,  No non-productive cough,  No- coughing up of blood.               change in color of mucus.  No- wheezing.   Skin: No-   rash or lesions. GI:  No-   heartburn, indigestion, abdominal pain, nausea, vomiting,  GU: MS:  .+ back pain Neuro-     nothing unusual Psych:  No- change in mood or affect. No depression or anxiety.  No memory loss.  OBJ General- Alert, Oriented, Affect-appropriate, Distress- none acute Skin-  Lymphadenopathy- none Head- atraumatic            Eyes- Gross vision intact, PERRLA, conjunctivae clear secretions            Ears- Hearing, canals-normal            Nose- Clear, no-Septal dev, mucus, polyps, erosion, perforation             Throat- Mallampati II, drainage- none, tonsils- atrophic.  Neck- flexible , trachea midline, no stridor , thyroid nl, carotid no bruit Chest - symmetrical excursion , unlabored           Heart/CV- RRR , no murmur , no gallop  , no rub, nl s1 s2                           -  JVD- none , edema-none, stasis changes- none, varices- none           Lung- cough-none, unlabored , dullness-none, rub- none           Chest wall-  Abd-  Br/ Gen/ Rectal- Not done, not indicated Extrem- cyanosis- none, clubbing, none, atrophy- none, strength- nl.  Neuro- grossly intact to observation

## 2020-02-24 ENCOUNTER — Encounter: Payer: Self-pay | Admitting: Internal Medicine

## 2020-02-24 ENCOUNTER — Other Ambulatory Visit: Payer: Self-pay

## 2020-02-24 ENCOUNTER — Ambulatory Visit: Payer: Medicare PPO | Admitting: Internal Medicine

## 2020-02-24 DIAGNOSIS — J302 Other seasonal allergic rhinitis: Secondary | ICD-10-CM | POA: Diagnosis not present

## 2020-02-24 DIAGNOSIS — J449 Chronic obstructive pulmonary disease, unspecified: Secondary | ICD-10-CM

## 2020-02-24 DIAGNOSIS — J3089 Other allergic rhinitis: Secondary | ICD-10-CM | POA: Diagnosis not present

## 2020-02-24 DIAGNOSIS — J4520 Mild intermittent asthma, uncomplicated: Secondary | ICD-10-CM | POA: Diagnosis not present

## 2020-02-24 MED ORDER — ALBUTEROL SULFATE HFA 108 (90 BASE) MCG/ACT IN AERS: INHALATION_SPRAY | RESPIRATORY_TRACT | 12 refills | Status: DC

## 2020-02-24 MED ORDER — EPINEPHRINE 0.3 MG/0.3ML IJ SOAJ: INTRAMUSCULAR | 12 refills | Status: DC

## 2020-02-24 NOTE — Patient Instructions (Addendum)
Ok to use [employer] as a maintenance daily inhaler while needed. We suggested trying it for a week then skipping it for a week to see what you think.  Please call if we can help

## 2020-02-26 ENCOUNTER — Ambulatory Visit (INDEPENDENT_AMBULATORY_CARE_PROVIDER_SITE_OTHER): Payer: Medicare PPO | Admitting: Family Medicine

## 2020-02-26 ENCOUNTER — Ambulatory Visit (INDEPENDENT_AMBULATORY_CARE_PROVIDER_SITE_OTHER)
Admission: RE | Admit: 2020-02-26 | Discharge: 2020-02-26 | Disposition: A | Discharge status: Home / Self Care | Payer: Medicare PPO | Source: Ambulatory Visit | Attending: Family Medicine | Admitting: Family Medicine

## 2020-02-26 ENCOUNTER — Encounter: Payer: Self-pay | Admitting: Family Medicine

## 2020-02-26 ENCOUNTER — Other Ambulatory Visit: Payer: Self-pay

## 2020-02-26 VITALS — BP 120/78 | HR 67 | Temp 97.9°F | Ht 69.0 in | Wt 207.4 lb

## 2020-02-26 DIAGNOSIS — M5441 Lumbago with sciatica, right side: Secondary | ICD-10-CM | POA: Diagnosis not present

## 2020-02-26 DIAGNOSIS — Z7189 Other specified counseling: Secondary | ICD-10-CM

## 2020-02-26 DIAGNOSIS — M47816 Spondylosis without myelopathy or radiculopathy, lumbar region: Secondary | ICD-10-CM | POA: Diagnosis not present

## 2020-02-26 DIAGNOSIS — K219 Gastro-esophageal reflux disease without esophagitis: Secondary | ICD-10-CM

## 2020-02-26 DIAGNOSIS — N401 Enlarged prostate with lower urinary tract symptoms: Secondary | ICD-10-CM

## 2020-02-26 DIAGNOSIS — M545 Low back pain, unspecified: Secondary | ICD-10-CM | POA: Diagnosis not present

## 2020-02-26 DIAGNOSIS — Z0000 Encounter for general adult medical examination without abnormal findings: Secondary | ICD-10-CM

## 2020-02-26 DIAGNOSIS — E538 Deficiency of other specified B group vitamins: Secondary | ICD-10-CM

## 2020-02-26 DIAGNOSIS — E785 Hyperlipidemia, unspecified: Secondary | ICD-10-CM

## 2020-02-26 DIAGNOSIS — R3914 Feeling of incomplete bladder emptying: Secondary | ICD-10-CM

## 2020-02-26 DIAGNOSIS — G8929 Other chronic pain: Secondary | ICD-10-CM | POA: Diagnosis not present

## 2020-02-26 MED ORDER — DICYCLOMINE HCL 10 MG PO CAPS: 10.0000 mg | ORAL_CAPSULE | Freq: Two times a day (BID) | ORAL | 1 refills | Status: DC | PRN

## 2020-02-26 NOTE — Assessment & Plan Note (Signed)
Preventative protocols reviewed and updated unless pt declined. Discussed healthy diet and lifestyle.  

## 2020-02-26 NOTE — Progress Notes (Signed)
Patient ID: Terrence Middleton, male    DOB: [birth year], 69 y.o.   MRN: [id number]  This visit was conducted in person.  BP 120/78 (BP Location: Left Arm, Patient Position: Sitting, Cuff Size: Large)   Pulse 67   Temp 97.9 F (36.6 C) (Temporal)   Ht 5\' 9"  (1.753 m)   Wt 207 lb 6 oz (94.1 kg)   SpO2 96%   BMI 30.62 kg/m    CC: AMW/CPE Subjective:   HPI: Terrence Middleton is a 68 y.o. male presenting on 02/26/2020 for Medicare Wellness   Did not see health advisor this year.    Hearing Screening   125Hz  250Hz  500Hz  1000Hz  2000Hz  3000Hz  4000Hz  6000Hz  8000Hz   Right ear:   20 20 20   40    Left ear:   20 20 20   0      Visual Acuity Screening   Right eye Left eye Both eyes  Without correction:     With correction: 20/13 20/25 20/13     Flowsheet Row Office Visit from 02/26/2020 in [facility] [place]  PHQ-2 Total Score 0      Fall Risk  02/26/2020 01/29/2019 01/19/2018  Falls in the past year? 0 0 0    Seen earlier this month with lower abd/groin pain - he tried sacroiliitis home exercises with benefit. Hasn't noted improvement with GTB exercises. Ongoing symptoms, worse after prolonged drive. Feels better when he sleeps on back vs on sides. Brings picture of old xrays Jul 12, 2016) showing marked dextroscoliosis of lumbar spine.   Uses dicyclomine for GI spasms.   Preventative: COLONOSCOPY Date: 11/2013 [facility] for 10 yrs([place]) Prostate screening - pt would like to continue yearly DRE/PSA. Nocturia x1.  Lung cancer screening - not eligible Flu shotyearly COVID vaccine Pfizer 04/2019 x2, 12/2019 Td 12-Jul-2005. .  Pneumovax - 02/2015, prevnar Jul 13, 2015 zostavax- 11/2012 shingrix - completed 02/2019, 09/2019 Advanced directives discussion: has all this at home. Wife is HCPOA. Asked to bring 01/21/2018 copy.  Seat belt use discussed. Sunscreen use discussed.No changing moles on skin. Sees Dr 04-04-1978 had BCC removed.  Non smoker. Previous chewing tobacco Alcohol  -1-2light beer/day Dentist q6 mo Eye exam yearly Bowel - no constipation  Bladder - no incontinence   Lives with wife, dog 12/2013).  Grown children. 33yo [facility] son passed away 2014/07/13 (pulm fibrosis - Hermanske-Pudlike syndrome)  Occupation: retired - was 01/2020 at 2008  Activity: no regular exercise. Does walk at work and stays active at home.  Diet: good water, fruits/vegetables daily     Relevant past medical, surgical, family and social history reviewed and updated as indicated. Interim medical history since our last visit reviewed. Allergies and medications reviewed and updated. Outpatient Medications Prior to Visit  Medication Sig Dispense Refill  . albuterol (PROAIR HFA) 108 (90 Base) MCG/ACT inhaler INHALE 2 PUFFS INTO THE LUNGS EVERY 4 (FOUR) HOURS AS NEEDED FOR WHEEZING OR SHORTNESS OF BREATH. 18 g 12  . EPINEPHrine 0.3 mg/0.3 mL IJ SOAJ injection Inject into thigh for severe allergic reaction 1 each 12  . famotidine (PEPCID) 20 MG tablet Take 20 mg by mouth daily.    . Multiple Vitamins-Minerals (MULTIVITAMIN MEN PO) Take 1 tablet by mouth daily.    . Omega-3 Fatty Acids (OMEGA 3 PO) Take 2 tablets by mouth daily.    . Sodium Chloride-Sodium Bicarb 1.57 g PACK Place into the nose. Uses every morning    . STIOLTO RESPIMAT 2.5-2.5 MCG/ACT AERS INHALE 2  PUFFS BY MOUTH INTO THE LUNGS DAILY 4 g 5  . dicyclomine (BENTYL) 10 MG capsule TAKE 1 CAPSULE (10 MG TOTAL) BY MOUTH 2 (TWO) TIMES DAILY AS NEEDED FOR SPASMS. 90 capsule 1   No facility-administered medications prior to visit.     Per HPI unless specifically indicated in ROS section below Review of Systems  Constitutional: Negative for activity change, appetite change, chills, fatigue, fever and unexpected weight change.  HENT: Negative for hearing loss.   Eyes: Negative for visual disturbance.  Respiratory: Negative for cough, chest tightness, shortness of breath and wheezing.   Cardiovascular:  Negative for chest pain, palpitations and leg swelling.  Gastrointestinal: Negative for abdominal distention, abdominal pain, blood in stool, constipation, diarrhea, nausea and vomiting.  Genitourinary: Negative for difficulty urinating and hematuria.  Musculoskeletal: Negative for arthralgias, myalgias and neck pain.  Skin: Negative for rash.  Neurological: Negative for dizziness, seizures, syncope and headaches.  Hematological: Negative for adenopathy. Does not bruise/bleed easily.  Psychiatric/Behavioral: Negative for dysphoric mood. The patient is not nervous/anxious.    Objective:  BP 120/78 (BP Location: Left Arm, Patient Position: Sitting, Cuff Size: Large)   Pulse 67   Temp 97.9 F (36.6 C) (Temporal)   Ht 5\' 9"  (1.753 m)   Wt 207 lb 6 oz (94.1 kg)   SpO2 96%   BMI 30.62 kg/m   Wt Readings from Last 3 Encounters:  02/26/20 207 lb 6 oz (94.1 kg)  02/24/20 209 lb 9.6 oz (95.1 kg)  02/10/20 212 lb 3 oz (96.2 kg)      Physical Exam Vitals and nursing note reviewed.  Constitutional:      General: He is not in acute distress.    Appearance: Normal appearance. He is well-developed and well-nourished. He is not ill-appearing.  HENT:     Head: Normocephalic and atraumatic.     Right Ear: Hearing, tympanic membrane, ear canal and external ear normal.     Left Ear: Hearing, tympanic membrane, ear canal and external ear normal.     Mouth/Throat:     Mouth: Oropharynx is clear and moist and mucous membranes are normal.     Pharynx: No posterior oropharyngeal edema.  Eyes:     General: No scleral icterus.    Extraocular Movements: Extraocular movements intact and EOM normal.     Conjunctiva/sclera: Conjunctivae normal.     Pupils: Pupils are equal, round, and reactive to light.  Neck:     Thyroid: No thyroid mass or thyromegaly.     Vascular: No carotid bruit.  Cardiovascular:     Rate and Rhythm: Normal rate and regular rhythm.     Pulses: Normal pulses and intact distal  pulses.          Radial pulses are 2+ on the right side and 2+ on the left side.     Heart sounds: Normal heart sounds. No murmur heard.   Pulmonary:     Effort: Pulmonary effort is normal. No respiratory distress.     Breath sounds: Normal breath sounds. No wheezing, rhonchi or rales.  Abdominal:     General: Abdomen is flat. Bowel sounds are normal. There is no distension.     Palpations: Abdomen is soft. There is no mass.     Tenderness: There is no abdominal tenderness. There is no guarding or rebound.     Hernia: No hernia is present.  Genitourinary:    Prostate: Enlarged (30gm). Not tender and no nodules present.     Rectum:  Normal. No mass, tenderness, anal fissure, external hemorrhoid or internal hemorrhoid. Normal anal tone.  Musculoskeletal:        General: No edema. Normal range of motion.     Cervical back: Normal range of motion and neck supple.     Right lower leg: No edema.     Left lower leg: No edema.     Comments: dextroscoliosis to thoracic and lumbar spine  Lymphadenopathy:     Cervical: No cervical adenopathy.  Skin:    General: Skin is warm and dry.     Findings: No rash.  Neurological:     General: No focal deficit present.     Mental Status: He is alert and oriented to person, place, and time.     Comments:  CN grossly intact, station and gait intact Recall 3/3 Calculation 5/5 DLROW  Psychiatric:        Mood and Affect: Mood and affect and mood normal.        Behavior: Behavior normal.        Thought Content: Thought content normal.        Judgment: Judgment normal.       Results for orders placed or performed in visit on 02/10/20  Lipid panel  Result Value Ref Range   Cholesterol 145 0 - 200 mg/dL   Triglycerides 16.1 0.0 - 149.0 mg/dL   HDL 09.60 >45.40 mg/dL   VLDL 7.0 0.0 - 98.1 mg/dL   LDL Cholesterol 96 0 - 99 mg/dL   Total CHOL/HDL Ratio 3    NonHDL 102.91   Comprehensive metabolic panel  Result Value Ref Range   Sodium 138 135 -  145 mEq/L   Potassium 4.4 3.5 - 5.1 mEq/L   Chloride 104 96 - 112 mEq/L   CO2 27 19 - 32 mEq/L   Glucose, Bld 80 70 - 99 mg/dL   BUN 18 6 - 23 mg/dL   Creatinine, Ser 1.91 0.40 - 1.50 mg/dL   Total Bilirubin 0.9 0.2 - 1.2 mg/dL   Alkaline Phosphatase 42 39 - 117 U/L   AST 15 0 - 37 U/L   ALT 15 0 - 53 U/L   Total Protein 6.6 6.0 - 8.3 g/dL   Albumin 4.3 3.5 - 5.2 g/dL   GFR 47.82 >95.62 mL/min   Calcium 8.8 8.4 - 10.5 mg/dL  PSA  Result Value Ref Range   PSA 1.68 0.10 - 4.00 ng/mL  Vitamin B12  Result Value Ref Range   Vitamin B-12 355 211 - 911 pg/mL  CBC with Differential/Platelet  Result Value Ref Range   WBC 5.0 4.0 - 10.5 K/uL   RBC 5.46 4.22 - 5.81 Mil/uL   Hemoglobin 14.4 13.0 - 17.0 g/dL   HCT 13.0 86.5 - 78.4 %   MCV 78.7 78.0 - 100.0 fl   MCHC 33.5 30.0 - 36.0 g/dL   RDW 69.6 29.5 - 28.4 %   Platelets 239.0 150.0 - 400.0 K/uL   Neutrophils Relative % 51.3 43.0 - 77.0 %   Lymphocytes Relative 33.6 12.0 - 46.0 %   Monocytes Relative 11.3 3.0 - 12.0 %   Eosinophils Relative 3.0 0.0 - 5.0 %   Basophils Relative 0.8 0.0 - 3.0 %   Neutro Abs 2.5 1.4 - 7.7 K/uL   Lymphs Abs 1.7 0.7 - 4.0 K/uL   Monocytes Absolute 0.6 0.1 - 1.0 K/uL   Eosinophils Absolute 0.1 0.0 - 0.7 K/uL   Basophils Absolute 0.0 0.0 - 0.1 K/uL  Hemoglobin A1c  Result Value Ref Range   Hgb A1c MFr Bld 5.8 4.6 - 6.5 %  POCT Urinalysis Dipstick (Automated)  Result Value Ref Range   Color, UA yellow    Clarity, UA clear    Glucose, UA Negative Negative   Bilirubin, UA negative    Ketones, UA negative    Spec Grav, UA 1.025 1.010 - 1.025   Blood, UA negative    pH, UA 6.0 5.0 - 8.0   Protein, UA Negative Negative   Urobilinogen, UA 0.2 0.2 or 1.0 E.U./dL   Nitrite, UA negative    Leukocytes, UA Negative Negative   Assessment & Plan:  This visit occurred during the SARS-CoV-2 public health emergency.  Safety protocols were in place, including screening questions prior to the visit,  additional usage of staff PPE, and extensive cleaning of exam room while observing appropriate contact time as indicated for disinfecting solutions.   Problem List Items Addressed This Visit    Right-sided back pain    Ongoing back pain, describes R lumbar radiculopathy worse with prolonged car rides - some improvement with use of home exercises for sacroiliitis - he desires to continue this. Discussed ortho eval vs formal PT eval as next steps.  Brings picture of old films from 2018 showing marked lumbar scoliosis - ?contributing to current symptomatology. Will update lumbar films today.       Relevant Orders   DG Lumbar Spine Complete   Medicare annual wellness visit, subsequent - Primary    I have personally reviewed the Medicare Annual Wellness questionnaire and have noted 1. The patient's medical and social history 2. Their use of alcohol, tobacco or illicit drugs 3. Their current medications and supplements 4. The patient's functional ability including ADL's, fall risks, home safety risks and hearing or visual impairment. Cognitive function has been assessed and addressed as indicated.  5. Diet and physical activity 6. Evidence for depression or mood disorders The patients weight, height, BMI have been recorded in the chart. I have made referrals, counseling and provided education to the patient based on review of the above and I have provided the pt with a written personalized care plan for preventive services. Provider list updated.. See scanned questionairre as needed for further documentation. Reviewed preventative protocols and updated unless pt declined.       Low serum vitamin B12    Continued low normal readings - discussed b12 dosing, rec adding 500-1046mcg b12 daily.       Health maintenance examination    Preventative protocols reviewed and updated unless pt declined. Discussed healthy diet and lifestyle.       GERD    Stable period on pepcid - continue.        Relevant Medications   dicyclomine (BENTYL) 10 MG capsule   Dyslipidemia    Chronic, stable period only on fish oil 2 gab daily.  The 10-year ASCVD risk score Denman George DC Montez Hageman., et al., 2013) is: 11.9%   Values used to calculate the score:     Age: 72 years     Sex: Male     Is Non-Hispanic African American: No     Diabetic: No     Tobacco smoker: No     Systolic Blood Pressure: 120 mmHg     Is BP treated: No     HDL Cholesterol: 42.1 mg/dL     Total Cholesterol: 145 mg/dL       BPH (benign prostatic hyperplasia)    This seems  improved. DRE with evidence of BPH. Consider flomax if symptoms return.       Advanced care planning/counseling discussion    Advanced directives discussion: has all this at home. Wife is HCPOA. Asked to bring [place] copy.           Meds ordered this encounter  Medications  . dicyclomine (BENTYL) 10 MG capsule    Sig: Take 1 capsule (10 mg total) by mouth 2 (two) times daily as needed for spasms.    Dispense:  90 capsule    Refill:  1  . vitamin B-12 (V-R VITAMIN B-12) 500 MCG tablet    Sig: Take 1 tablet (500 mcg total) by mouth daily.   Orders Placed This Encounter  Procedures  . DG Lumbar Spine Complete    Standing Status:   Future    Number of Occurrences:   1    Standing Expiration Date:   02/25/2021    Order Specific Question:   Reason for Exam (SYMPTOM  OR DIAGNOSIS REQUIRED)    Answer:   scoliosis, R lower back pain    Order Specific Question:   Preferred imaging location?    Answer:   [facility]    Patient instructions: I will request records from [facility] (Dr Aundria Rud) - and recommend repeat lumbar xrays today. Consider PT vs return to ortho if worsening back. For now, continue current home exercises for sacroiliac joint.  Bring [place] copy of your advanced directive to update your chart.  Good to see you today. Call us with questions Return as needed or in 1 year for next physical.   Follow up plan: Return in about 1 year (around  02/25/2021) for annual exam, prior fasting for blood work, medicare wellness visit.  Eustaquio Boyden, MD

## 2020-02-26 NOTE — Assessment & Plan Note (Signed)
Advanced directives discussion: has all this at home. Wife is HCPOA. Asked to bring [place] copy.

## 2020-02-26 NOTE — Assessment & Plan Note (Signed)

## 2020-02-26 NOTE — Patient Instructions (Addendum)
I will request records from [facility] (Dr Aundria Rud) - and recommend repeat lumbar xrays today. Consider PT vs return to ortho if worsening back. For now, continue current home exercises for sacroiliac joint.  Bring [place] copy of your advanced directive to update your chart.  Good to see you today. Call us with questions Return as needed or in 1 year for next physical.   Health Maintenance After Age 67 After age 65, you are at a higher risk for certain long-term diseases and infections as well as injuries from falls. Falls are a major cause of broken bones and head injuries in people who are older than age 34. Getting regular preventive care can help to keep you healthy and well. Preventive care includes getting regular testing and making lifestyle changes as recommended by your health care provider. Talk with your health care provider about:  Which screenings and tests you should have. A screening is a test that checks for a disease when you have no symptoms.  A diet and exercise plan that is right for you. What should I know about screenings and tests to prevent falls? Screening and testing are the best ways to find a health problem early. Early diagnosis and treatment give you the best chance of managing medical conditions that are common after age 40. Certain conditions and lifestyle choices may make you more likely to have a fall. Your health care provider may recommend:  Regular vision checks. Poor vision and conditions such as cataracts can make you more likely to have a fall. If you wear glasses, make sure to get your prescription updated if your vision changes.  Medicine review. Work with your health care provider to regularly review all of the medicines you are taking, including over-the-counter medicines. Ask your health care provider about any side effects that may make you more likely to have a fall. Tell your health care provider if any medicines that you take make you feel dizzy or  sleepy.  Osteoporosis screening. Osteoporosis is a condition that causes the bones to get weaker. This can make the bones weak and cause them to break more easily.  Blood pressure screening. Blood pressure changes and medicines to control blood pressure can make you feel dizzy.  Strength and balance checks. Your health care provider may recommend certain tests to check your strength and balance while standing, walking, or changing positions.  Foot health exam. Foot pain and numbness, as well as not wearing proper footwear, can make you more likely to have a fall.  Depression screening. You may be more likely to have a fall if you have a fear of falling, feel emotionally low, or feel unable to do activities that you used to do.  Alcohol use screening. Using too much alcohol can affect your balance and may make you more likely to have a fall. What actions can I take to lower my risk of falls? General instructions  Talk with your health care provider about your risks for falling. Tell your health care provider if: ? You fall. Be sure to tell your health care provider about all falls, even ones that seem minor. ? You feel dizzy, sleepy, or off-balance.  Take over-the-counter and prescription medicines only as told by your health care provider. These include any supplements.  Eat a healthy diet and maintain a healthy weight. A healthy diet includes low-fat dairy products, low-fat (lean) meats, and fiber from whole grains, beans, and lots of fruits and vegetables. Home safety  Remove  any tripping hazards, such as rugs, cords, and clutter.  Install safety equipment such as grab bars in bathrooms and safety rails on stairs.  Keep rooms and walkways well-lit. Activity   Follow a regular exercise program to stay fit. This will help you maintain your balance. Ask your health care provider what types of exercise are appropriate for you.  If you need a cane or walker, use it as recommended by  your health care provider.  Wear supportive shoes that have nonskid soles. Lifestyle  Do not drink alcohol if your health care provider tells you not to drink.  If you drink alcohol, limit how much you have: ? 0-1 drink a day for women. ? 0-2 drinks a day for men.  Be aware of how much alcohol is in your drink. In the [place] one drink equals one typical bottle of beer (12 oz), one-half glass of wine (5 oz), or one shot of hard liquor (1 oz).  Do not use any products that contain nicotine or tobacco, such as cigarettes and e-cigarettes. If you need help quitting, ask your health care provider. Summary  Having a healthy lifestyle and getting preventive care can help to protect your health and wellness after age 45.  Screening and testing are the best way to find a health problem early and help you avoid having a fall. Early diagnosis and treatment give you the best chance for managing medical conditions that are more common for people who are older than age 31.  Falls are a major cause of broken bones and head injuries in people who are older than age 47. Take precautions to prevent a fall at home.  Work with your health care provider to learn what changes you can make to improve your health and wellness and to prevent falls. This information is not intended to replace advice given to you by your health care provider. Make sure you discuss any questions you have with your health care provider. Document Revised: 06/14/2018 Document Reviewed: 01/04/2017 Elsevier Patient Education  2020 [employer].

## 2020-02-27 MED ORDER — CYANOCOBALAMIN 500 MCG PO TABS: 500.0000 ug | ORAL_TABLET | Freq: Every day | ORAL | Status: DC

## 2020-02-27 NOTE — Assessment & Plan Note (Addendum)
Continued low normal readings - discussed b12 dosing, rec adding 500-1053mcg b12 daily.

## 2020-02-27 NOTE — Assessment & Plan Note (Signed)
Ongoing back pain, describes R lumbar radiculopathy worse with prolonged car rides - some improvement with use of home exercises for sacroiliitis - he desires to continue this. Discussed ortho eval vs formal PT eval as next steps.  Brings picture of old films from 2018 showing marked lumbar scoliosis - ?contributing to current symptomatology. Will update lumbar films today.

## 2020-02-27 NOTE — Assessment & Plan Note (Addendum)
Chronic, stable period only on fish oil 2 gab daily.  The 10-year ASCVD risk score Denman George DC Montez Hageman., et al., 2013) is: 11.9%   Values used to calculate the score:     Age: 69 years     Sex: Male     Is Non-Hispanic African American: No     Diabetic: No     Tobacco smoker: No     Systolic Blood Pressure: 120 mmHg     Is BP treated: No     HDL Cholesterol: 42.1 mg/dL     Total Cholesterol: 145 mg/dL

## 2020-02-27 NOTE — Assessment & Plan Note (Addendum)
This seems improved. DRE with evidence of BPH. Consider flomax if symptoms return.

## 2020-02-27 NOTE — Assessment & Plan Note (Signed)
Stable period on pepcid - continue.

## 2020-03-24 ENCOUNTER — Encounter: Payer: Self-pay | Admitting: Family Medicine

## 2020-03-24 DIAGNOSIS — M419 Scoliosis, unspecified: Secondary | ICD-10-CM | POA: Insufficient documentation

## 2020-03-26 NOTE — Assessment & Plan Note (Signed)
Well-controlled on Stiolto with occasional rescue hfa and no significant flares. Plan- continue Stiolto

## 2020-03-26 NOTE — Assessment & Plan Note (Signed)
He is controlling symptoms and avoiding sinus infection so far with daily nasal saline rinse.  Plan- ok to continue.

## 2020-07-09 ENCOUNTER — Other Ambulatory Visit: Payer: Self-pay | Admitting: Family Medicine

## 2020-08-05 ENCOUNTER — Other Ambulatory Visit: Payer: Self-pay

## 2020-08-05 ENCOUNTER — Encounter: Payer: Self-pay | Admitting: Family Medicine

## 2020-08-05 ENCOUNTER — Telehealth: Payer: Self-pay | Admitting: Family Medicine

## 2020-08-05 ENCOUNTER — Ambulatory Visit: Payer: Medicare PPO | Admitting: Family Medicine

## 2020-08-05 VITALS — BP 118/62 | HR 71 | Temp 97.9°F | Ht 69.0 in | Wt 199.2 lb

## 2020-08-05 DIAGNOSIS — M419 Scoliosis, unspecified: Secondary | ICD-10-CM

## 2020-08-05 DIAGNOSIS — G8929 Other chronic pain: Secondary | ICD-10-CM | POA: Diagnosis not present

## 2020-08-05 DIAGNOSIS — M5441 Lumbago with sciatica, right side: Secondary | ICD-10-CM | POA: Diagnosis not present

## 2020-08-05 DIAGNOSIS — R1031 Right lower quadrant pain: Secondary | ICD-10-CM | POA: Diagnosis not present

## 2020-08-05 NOTE — Assessment & Plan Note (Addendum)
Chronic R sided lower back pain with radiation into groin and occasionally down R leg. Pain worse when turning over in bed or when seated in car.  Prior lumbar films showed known scoliosis as well as moderate DDD and facet arthrosis. Anticipate R lumbar radiculopathy and facet arthropathy contributing to current ongoing pain.  Has tried and failed NSAID (aleve) and home exercise program.  Given duration of pain and known scoliosis, recommend MRI as next step to help find target for possible steroid injection through PM&R. Pt agrees with plan.

## 2020-08-05 NOTE — Progress Notes (Signed)
Patient ID: Terrence Middleton, male    DOB: 1950/12/05, 70 y.o.   MRN: [id number]  This visit was conducted in person.  BP 118/62   Pulse 71   Temp 97.9 F (36.6 C) (Temporal)   Ht 5\' 9"  (1.753 m)   Wt 199 lb 3 oz (90.4 kg)   SpO2 97%   BMI 29.41 kg/m    CC: back pain  Subjective:   HPI: Terrence Middleton is a 70 y.o. male presenting on 08/05/2020 for Back Pain (C/o continuous back pain, barely improved.  Here for f/u.  Also, wants to discuss B12. ) and Form Completion (Provided [facility] form to be completed. )   Ongoing lower back pain present since 02/2020. Points to R lower back pain as well as R groin. Sleeps better on his back. Pain can wake him up at night time - affecting restful sleep. Dull, can be sharp shooting pain. No burning pain. Sometimes shooting pain down right leg.  Previously treated with aleve and sacroiliac and hip bursitis exercises - with benefit.  Denies inciting trauma/falls.  Some better over the last few days.  No bowel/bladder incontinence, saddle anesthesia, leg/foot numbness/weakness.   He's gotten into scouting with grandchildren - camping on the ground aggravates pain. Notes significant pain with riding any vehicle for more than 5 minutes - R lower back pain with shooting pain down R leg.   6d h/o nausea, diarrhea, fatigue, malaise, headache, body aches - treated with tylenol. Symptoms largely improved now. Has recently had tick bites. Wife yesterday started feeling sick. Did home COVID tests x2 - both negative. He does get tick bites in the summers. No fever, rash, respiratory symptoms.      Relevant past medical, surgical, family and social history reviewed and updated as indicated. Interim medical history since our last visit reviewed. Allergies and medications reviewed and updated. Outpatient Medications Prior to Visit  Medication Sig Dispense Refill  . albuterol (PROAIR HFA) 108 (90 Base) MCG/ACT inhaler INHALE 2 PUFFS INTO THE LUNGS  EVERY 4 (FOUR) HOURS AS NEEDED FOR WHEEZING OR SHORTNESS OF BREATH. 18 g 12  . dicyclomine (BENTYL) 10 MG capsule TAKE 1 CAPSULE (10 MG TOTAL) BY MOUTH 2 (TWO) TIMES DAILY AS NEEDED FOR SPASMS. 180 capsule 1  . EPINEPHrine 0.3 mg/0.3 mL IJ SOAJ injection Inject into thigh for severe allergic reaction 1 each 12  . famotidine (PEPCID) 20 MG tablet Take 20 mg by mouth daily.    03/2020 MISC NATURAL PRODUCTS PO Take 500 mg by mouth daily. Tumeric    . Multiple Vitamins-Minerals (MULTIVITAMIN MEN PO) Take 1 tablet by mouth daily.    . Omega-3 Fatty Acids (OMEGA 3 PO) Take 2 tablets by mouth daily.    . Sodium Chloride-Sodium Bicarb 1.57 g PACK Place into the nose. Uses every morning    . STIOLTO RESPIMAT 2.5-2.5 MCG/ACT AERS INHALE 2 PUFFS BY MOUTH INTO THE LUNGS DAILY 4 g 5  . vitamin B-12 (V-R VITAMIN B-12) 500 MCG tablet Take 1 tablet (500 mcg total) by mouth daily.     No facility-administered medications prior to visit.     Per HPI unless specifically indicated in ROS section below Review of Systems Objective:  BP 118/62   Pulse 71   Temp 97.9 F (36.6 C) (Temporal)   Ht 5\' 9"  (1.753 m)   Wt 199 lb 3 oz (90.4 kg)   SpO2 97%   BMI 29.41 kg/m   Wt Readings  from Last 3 Encounters:  08/05/20 199 lb 3 oz (90.4 kg)  02/26/20 207 lb 6 oz (94.1 kg)  02/24/20 209 lb 9.6 oz (95.1 kg)      Physical Exam Vitals and nursing note reviewed.  Constitutional:      Appearance: Normal appearance. He is not ill-appearing.  HENT:     Head: Normocephalic and atraumatic.     Mouth/Throat:     Mouth: Mucous membranes are moist.     Pharynx: Oropharynx is clear. No oropharyngeal exudate or posterior oropharyngeal erythema.  Eyes:     General:        Right eye: No discharge.        Left eye: No discharge.     Extraocular Movements: Extraocular movements intact.     Conjunctiva/sclera: Conjunctivae normal.     Pupils: Pupils are equal, round, and reactive to light.  Cardiovascular:     Rate and  Rhythm: Normal rate and regular rhythm.     Pulses: Normal pulses.     Heart sounds: Normal heart sounds. No murmur heard.   Pulmonary:     Effort: Pulmonary effort is normal. No respiratory distress.     Breath sounds: Normal breath sounds. No wheezing, rhonchi or rales.  Musculoskeletal:        General: Normal range of motion.     Cervical back: Normal range of motion and neck supple.     Right lower leg: No edema.     Left lower leg: No edema.     Comments:  No pain midline spine Thoracolumbar dextroscoliosis present No paraspinous mm tenderness Neg SLR bilaterally. No pain with int/ext rotation at hip. Pain with FABER on right No pain at SIJ, GTB or sciatic notch bilaterally.   Lymphadenopathy:     Cervical: No cervical adenopathy.  Skin:    General: Skin is warm and dry.     Capillary Refill: Capillary refill takes less than 2 seconds.     Findings: No rash.  Neurological:     General: No focal deficit present.     Mental Status: He is alert.     Sensory: Sensation is intact.     Motor: Motor function is intact.     Coordination: Coordination is intact.     Gait: Gait is intact.     Deep Tendon Reflexes:     Reflex Scores:      Patellar reflexes are 2+ on the right side and 2+ on the left side.      Achilles reflexes are 2+ on the right side and 2+ on the left side.    Comments:  5/5 strength BLE Sensation intact  Psychiatric:        Mood and Affect: Mood normal.        Behavior: Behavior normal.       DG Lumbar Spine Complete CLINICAL DATA:  Right-sided low back pain  EXAM: LUMBAR SPINE - COMPLETE 4+ VIEW  COMPARISON:  01/02/2006  FINDINGS: Five lumbar type vertebral segments. Lumbar dextrocurvature, progressed from prior. Vertebral body heights are maintained. No fracture identified. Moderate multilevel intervertebral disc space loss throughout the lumbar spine. Prominent left lateral bridging osteophytes of L2-3 and L3-4. Lower lumbar facet  arthrosis.  IMPRESSION: 1. Moderate multilevel lumbar spondylosis. 2. Lumbar dextrocurvature, progressed from prior.  Electronically Signed   By: Duanne Guess D.O.   On: 02/27/2020 13:30   Assessment & Plan:  This visit occurred during the SARS-CoV-2 public health emergency.  Safety protocols were  in place, including screening questions prior to the visit, additional usage of staff PPE, and extensive cleaning of exam room while observing appropriate contact time as indicated for disinfecting solutions.   What sounds like recent viral gastroenteritis is resolving.  Problem List Items Addressed This Visit    Right-sided back pain - Primary    Chronic R sided lower back pain with radiation into groin and occasionally down R leg. Pain worse when turning over in bed or when seated in car.  Prior lumbar films showed known scoliosis as well as moderate DDD and facet arthrosis. Anticipate R lumbar radiculopathy and facet arthropathy contributing to current ongoing pain.  Has tried and failed NSAID (aleve) and home exercise program.  Given duration of pain and known scoliosis, recommend MRI as next step to help find target for possible steroid injection through PM&R. Pt agrees with plan.       Relevant Orders   MR Lumbar Spine Wo Contrast   Lumbar scoliosis    Chronic, progression on latest lumbar films.  Will await MRI results.       Relevant Orders   MR Lumbar Spine Wo Contrast    Other Visit Diagnoses    Right groin pain       Relevant Orders   DG Hip Unilat W OR W/O Pelvis 2-3 Views Right       No orders of the defined types were placed in this encounter.  Orders Placed This Encounter  Procedures  . MR Lumbar Spine Wo Contrast    Standing Status:   Future    Standing Expiration Date:   08/05/2021    Order Specific Question:   What is the patient's sedation requirement?    Answer:   No Sedation    Order Specific Question:   Does the patient have a pacemaker or implanted  devices?    Answer:   No    Order Specific Question:   Preferred imaging location?    Answer:   [facility] (table limit-350lbs)  . DG Hip Unilat W OR W/O Pelvis 2-3 Views Right    Standing Status:   Future    Standing Expiration Date:   08/05/2021    Order Specific Question:   Reason for Exam (SYMPTOM  OR DIAGNOSIS REQUIRED)    Answer:   R groin pain, longstanding    Order Specific Question:   Preferred imaging location?    Answer:   [facility]    Patient Instructions  Given duration of pain recommend MRI of lumbar spine for further evaluation. If not approved, we may need to first start with outpatient physical therapy.  Restart home exercises, ok to continue aleve if tolerated well, ok to use ice or heating pad.   Follow up plan: Return if symptoms worsen or fail to improve.  Eustaquio Boyden, MD

## 2020-08-05 NOTE — Assessment & Plan Note (Signed)
Chronic, progression on latest lumbar films.  Will await MRI results.

## 2020-08-05 NOTE — Telephone Encounter (Signed)
Spoke with pt relaying Dr. Timoteo Expose message.  Pt verbalizes understanding and will come in tomorrow.

## 2020-08-05 NOTE — Patient Instructions (Addendum)
Given duration of pain recommend MRI of lumbar spine for further evaluation. If not approved, we may need to first start with outpatient physical therapy.  Restart home exercises, ok to continue aleve if tolerated well, ok to use ice or heating pad.

## 2020-08-05 NOTE — Telephone Encounter (Signed)
Pt seen today - he did describe a component of hip joint pain - I'd like to check R hip xray in office as well - I have ordered, plz have him come in at his convenience.

## 2020-08-06 ENCOUNTER — Ambulatory Visit (INDEPENDENT_AMBULATORY_CARE_PROVIDER_SITE_OTHER)
Admission: RE | Admit: 2020-08-06 | Discharge: 2020-08-06 | Disposition: A | Discharge status: Home / Self Care | Payer: Medicare PPO | Source: Ambulatory Visit | Attending: Family Medicine | Admitting: Family Medicine

## 2020-08-06 ENCOUNTER — Telehealth: Payer: Self-pay

## 2020-08-06 DIAGNOSIS — R1030 Lower abdominal pain, unspecified: Secondary | ICD-10-CM | POA: Diagnosis not present

## 2020-08-06 DIAGNOSIS — M47816 Spondylosis without myelopathy or radiculopathy, lumbar region: Secondary | ICD-10-CM | POA: Diagnosis not present

## 2020-08-06 DIAGNOSIS — R1031 Right lower quadrant pain: Secondary | ICD-10-CM | POA: Diagnosis not present

## 2020-08-06 DIAGNOSIS — M4186 Other forms of scoliosis, lumbar region: Secondary | ICD-10-CM | POA: Diagnosis not present

## 2020-08-06 DIAGNOSIS — M1611 Unilateral primary osteoarthritis, right hip: Secondary | ICD-10-CM | POA: Diagnosis not present

## 2020-08-06 NOTE — Telephone Encounter (Signed)
Patient states that he forgot to discuss with Dr Reece Agar during recent OV when he should re check his B12 levels. I could not find a note stating when to recheck and advised patient I would have to ask Dr Reece Agar and that he was out of the office today. Please let patient know when able to. Thank you.

## 2020-08-06 NOTE — Telephone Encounter (Signed)
Recommend we recheck next time he has blood drawn.  Levels were never truly low just low normal. If he's taking oral B12 anticipate levels to be improved.

## 2020-08-07 NOTE — Telephone Encounter (Signed)
Spoke with pt relaying Dr. G's message. Pt verbalizes understanding.  

## 2020-08-13 ENCOUNTER — Telehealth: Payer: Self-pay | Admitting: Family Medicine

## 2020-08-13 NOTE — Telephone Encounter (Signed)
This has already been approved on 08/12/20 - will call patient 6/10  Authorization approved through [employer] website AUTHORIZATION NUMBER [id number] Confirmation Date:      Aug 12 2020 - Sep 11 2020 Location [facility]

## 2020-08-13 NOTE — Telephone Encounter (Signed)
Pt called to inquire about his upcoming MRI. He is requesting a call from referral coordinator to discuss insurance coverage and to make sure there will not be any problems with him getting his MRI done next week.

## 2020-08-14 NOTE — Telephone Encounter (Signed)
I called and left detailed message for the patient about this

## 2020-08-18 ENCOUNTER — Ambulatory Visit
Admission: RE | Admit: 2020-08-18 | Discharge: 2020-08-18 | Disposition: A | Discharge status: Home / Self Care | Payer: Medicare PPO | Source: Ambulatory Visit | Attending: Family Medicine | Admitting: Family Medicine

## 2020-08-18 ENCOUNTER — Other Ambulatory Visit: Payer: Self-pay

## 2020-08-18 DIAGNOSIS — M419 Scoliosis, unspecified: Secondary | ICD-10-CM | POA: Insufficient documentation

## 2020-08-18 DIAGNOSIS — M5441 Lumbago with sciatica, right side: Secondary | ICD-10-CM | POA: Insufficient documentation

## 2020-08-18 DIAGNOSIS — G8929 Other chronic pain: Secondary | ICD-10-CM

## 2020-08-18 DIAGNOSIS — M545 Low back pain, unspecified: Secondary | ICD-10-CM | POA: Diagnosis not present

## 2020-08-19 ENCOUNTER — Encounter: Payer: Self-pay | Admitting: Family Medicine

## 2020-08-19 ENCOUNTER — Other Ambulatory Visit: Payer: Self-pay | Admitting: Family Medicine

## 2020-08-19 DIAGNOSIS — G8929 Other chronic pain: Secondary | ICD-10-CM

## 2020-08-29 NOTE — Telephone Encounter (Signed)
Appt scheduled with NSG 10/01/2020.

## 2020-10-01 DIAGNOSIS — M419 Scoliosis, unspecified: Secondary | ICD-10-CM | POA: Diagnosis not present

## 2020-10-01 DIAGNOSIS — M438X9 Other specified deforming dorsopathies, site unspecified: Secondary | ICD-10-CM | POA: Diagnosis not present

## 2020-10-01 DIAGNOSIS — M5416 Radiculopathy, lumbar region: Secondary | ICD-10-CM | POA: Diagnosis not present

## 2020-10-23 DIAGNOSIS — M5416 Radiculopathy, lumbar region: Secondary | ICD-10-CM | POA: Diagnosis not present

## 2020-10-23 DIAGNOSIS — M5126 Other intervertebral disc displacement, lumbar region: Secondary | ICD-10-CM | POA: Diagnosis not present

## 2020-11-02 ENCOUNTER — Other Ambulatory Visit: Payer: Self-pay | Admitting: Neurosurgery

## 2020-11-02 ENCOUNTER — Ambulatory Visit
Admission: RE | Admit: 2020-11-02 | Discharge: 2020-11-02 | Disposition: A | Discharge status: Home / Self Care | Payer: Medicare PPO | Source: Ambulatory Visit | Attending: Neurosurgery | Admitting: Neurosurgery

## 2020-11-02 ENCOUNTER — Ambulatory Visit
Admission: RE | Admit: 2020-11-02 | Discharge: 2020-11-02 | Disposition: A | Discharge status: Home / Self Care | Payer: Medicare PPO | Attending: Neurosurgery | Admitting: Neurosurgery

## 2020-11-02 DIAGNOSIS — M4185 Other forms of scoliosis, thoracolumbar region: Secondary | ICD-10-CM | POA: Diagnosis not present

## 2020-11-02 DIAGNOSIS — G8929 Other chronic pain: Secondary | ICD-10-CM | POA: Diagnosis not present

## 2020-11-02 DIAGNOSIS — M419 Scoliosis, unspecified: Secondary | ICD-10-CM

## 2020-11-02 DIAGNOSIS — M5136 Other intervertebral disc degeneration, lumbar region: Secondary | ICD-10-CM | POA: Diagnosis not present

## 2020-11-02 DIAGNOSIS — M5441 Lumbago with sciatica, right side: Secondary | ICD-10-CM | POA: Diagnosis not present

## 2020-11-02 DIAGNOSIS — M5416 Radiculopathy, lumbar region: Secondary | ICD-10-CM | POA: Diagnosis not present

## 2020-11-17 DIAGNOSIS — M5416 Radiculopathy, lumbar region: Secondary | ICD-10-CM | POA: Diagnosis not present

## 2020-11-26 DIAGNOSIS — M5416 Radiculopathy, lumbar region: Secondary | ICD-10-CM | POA: Diagnosis not present

## 2020-11-30 DIAGNOSIS — H43813 Vitreous degeneration, bilateral: Secondary | ICD-10-CM | POA: Diagnosis not present

## 2020-12-08 DIAGNOSIS — M5416 Radiculopathy, lumbar region: Secondary | ICD-10-CM | POA: Diagnosis not present

## 2020-12-22 DIAGNOSIS — M5416 Radiculopathy, lumbar region: Secondary | ICD-10-CM | POA: Diagnosis not present

## 2020-12-31 DIAGNOSIS — M419 Scoliosis, unspecified: Secondary | ICD-10-CM | POA: Diagnosis not present

## 2020-12-31 DIAGNOSIS — M5416 Radiculopathy, lumbar region: Secondary | ICD-10-CM | POA: Diagnosis not present

## 2020-12-31 DIAGNOSIS — M438X9 Other specified deforming dorsopathies, site unspecified: Secondary | ICD-10-CM | POA: Diagnosis not present

## 2021-02-02 DIAGNOSIS — L570 Actinic keratosis: Secondary | ICD-10-CM | POA: Diagnosis not present

## 2021-02-02 DIAGNOSIS — D225 Melanocytic nevi of trunk: Secondary | ICD-10-CM | POA: Diagnosis not present

## 2021-02-02 DIAGNOSIS — Z85828 Personal history of other malignant neoplasm of skin: Secondary | ICD-10-CM | POA: Diagnosis not present

## 2021-02-02 DIAGNOSIS — L820 Inflamed seborrheic keratosis: Secondary | ICD-10-CM | POA: Diagnosis not present

## 2021-02-07 ENCOUNTER — Encounter: Payer: Self-pay | Admitting: Family Medicine

## 2021-02-08 NOTE — Telephone Encounter (Signed)
Updated pt's chart.  

## 2021-02-09 ENCOUNTER — Other Ambulatory Visit: Payer: Self-pay

## 2021-02-09 ENCOUNTER — Ambulatory Visit: Payer: Medicare PPO | Admitting: Primary Care

## 2021-02-09 ENCOUNTER — Encounter: Payer: Self-pay | Admitting: Primary Care

## 2021-02-09 DIAGNOSIS — J4521 Mild intermittent asthma with (acute) exacerbation: Secondary | ICD-10-CM | POA: Diagnosis not present

## 2021-02-09 DIAGNOSIS — J3089 Other allergic rhinitis: Secondary | ICD-10-CM | POA: Diagnosis not present

## 2021-02-09 DIAGNOSIS — J302 Other seasonal allergic rhinitis: Secondary | ICD-10-CM | POA: Diagnosis not present

## 2021-02-09 MED ORDER — DOXYCYCLINE HYCLATE 100 MG PO TABS: 100.0000 mg | ORAL_TABLET | Freq: Two times a day (BID) | ORAL | 0 refills | Status: DC

## 2021-02-09 MED ORDER — HYDROCODONE BIT-HOMATROP MBR 5-1.5 MG/5ML PO SOLN: 5.0000 mL | Freq: Every evening | ORAL | 0 refills | Status: DC | PRN

## 2021-02-09 MED ORDER — PREDNISONE 10 MG PO TABS: ORAL_TABLET | ORAL | 0 refills | Status: DC

## 2021-02-09 NOTE — Assessment & Plan Note (Addendum)
-   Patient has had symptoms for over a week. No improvement with OTC cough suppressants or expectorants. Cough is productive with purulent mucus. VSS, Lungs were clear on exam. Sending in Rx doxycycline 100mg  BID x 7 days and prednisone taper 40mg  x 2 days; 30mg  x 2 days; 20mg  x 2 days; 10mg  x 2 days for acute exacerbation of asthmatic bronchitis. He can also take Hycodan 23ml at bedtime for cough. Recommend he continue Stiolto two puffs daily and use albuterol hfa prn every 4-6 hours for breakthrough sob//wheezing. FU in January with Dr. .

## 2021-02-09 NOTE — Assessment & Plan Note (Signed)
-   Use saline nasal rinses 1-2 times daily as needed for nasal congestion

## 2021-02-09 NOTE — Patient Instructions (Addendum)
Recommendations: - Continue Stiolto Respimat 2 puffs daily in the morning for next 1-2 weeks; then resume as needed  - Use Albuterol rescue inhaler every 4-6 hours for breakthrough sob/chest tightness symptoms  - Use saline nasal rinse 1-2 times a day for nasal congestion  - Call if symptoms are not improving  Rx: - Doxycycline 100mg  twice daily x 7 days - Prednisone taper as directed  - Hycodan cough syrup at bedtime for cough suppression  Follow-up: - As scheduled with Dr. 

## 2021-02-09 NOTE — Progress Notes (Signed)
@Patient  ID: , male    DOB: Feb 28, 1951, 70 y.o.   MRN: [id number]  Chief Complaint  Patient presents with   Follow-up    Patient says he's got a bad cough. He said it's starting to get heavy in his chest    Referring provider: [id number], MD  HPI: 70 year old male, never smoked.  Past medical history significant for mild intermittent asthma, seasonal and perennial allergic rhinitis, GERD, dyslipidemia, insect venom allergy, peripheral venous insufficiency.  Patient of Dr. 73, last seen in office on 02/24/2020.  Previous [facility] pulmonary encounter: 02/24/20- 70 year old male never smoker followed for asthma, allergic rhinitis, complicated by GERD, history of insect venom allergy, peripheral venous insufficiency, (son died of Hermansky- Pudlak syndrome) Proair hfa, EpiPen (beekeeper),Stiolto 2.5, Covid vax- 3 Phizer Flu vax- had Rarely needs rescue hfa. Not sure how much Stiolto helps- doesn't note need, but diligent using 2 puffs daily. We discussed rescue vs maintenance. 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 Little wheeze or cough. Showed me xray demonstrating scoliosis L-S spine. I explained this won't directly impact breathing. Low back pain is being followed.  Daily saline rinse helps nose.   02/09/2021- Interim hx  Patient presents today for acute visit. Hx asthmatic bronchitis and allergic rhinitis. Maintained on Stiolto and proair hfa. Typically his asthma symptoms are well controlled. 1 week ago he developed sore throat, nasal drainage and cough. He has some associated wheezing. He is coughing up purulent mucus. Coughing symptoms are keeping him up at night. He started back on stiolto 3-4 days ago. He has not needed to use albuterol. He tried delsym cough suppressant and guaifenesin 600mg  every 12 hours along with tylenol. He took home covid test which was negative.    Allergies  Allergen Reactions   Bee Venom     Sees  pulmonologist   Sulfonamide Derivatives Other (See Comments)    GI upset    Immunization History  Administered Date(s) Administered   Fluad Quad(high Dose 65+) 01/22/2019   Influenza Split 02/11/2011, 02/16/2012   Influenza, High Dose Seasonal PF 12/12/2019, 12/22/2020   Influenza,inj,Quad PF,6+ Mos 11/12/2012, 11/20/2013, 11/24/2014, 11/27/2015, 12/27/2016, 01/19/2018   PFIZER(Purple Top)SARS-COV-2 Vaccination 04/13/2019, 05/04/2019, 12/12/2019, 10/23/2020   Pneumococcal Conjugate-13 02/22/2016   Pneumococcal Polysaccharide-23 02/19/2015   Td 01/04/2006   Tdap 11/27/2015, 06/27/2019   Zoster Recombinat (Shingrix) 03/04/2019, 10/03/2019   Zoster, Live 11/12/2012    Past Medical History:  Diagnosis Date   ALLERGIC RHINITIS 02/14/2008   01/12/2013 MD, 14/12/2007 D    Allergy to insect stings 02/10/2011   Hymenoptera sting    Asthma    exercise induced per patient Joni Fears)   GERD (gastroesophageal reflux disease)    PVD (posterior vitreous detachment), left     Tobacco History: Social History   Tobacco Use  Smoking Status Never  Smokeless Tobacco Never   Counseling given: Not Answered   Outpatient Medications Prior to Visit  Medication Sig Dispense Refill   albuterol (PROAIR HFA) 108 (90 Base) MCG/ACT inhaler INHALE 2 PUFFS INTO THE LUNGS EVERY 4 (FOUR) HOURS AS NEEDED FOR WHEEZING OR SHORTNESS OF BREATH. 18 g 12   dicyclomine (BENTYL) 10 MG capsule TAKE 1 CAPSULE (10 MG TOTAL) BY MOUTH 2 (TWO) TIMES DAILY AS NEEDED FOR SPASMS. 180 capsule 1   EPINEPHrine 0.3 mg/0.3 mL IJ SOAJ injection Inject into thigh for severe allergic reaction 1 each 12   famotidine (PEPCID) 20 MG tablet Take 20 mg by  mouth daily.     MISC NATURAL PRODUCTS PO Take 500 mg by mouth daily. Tumeric     Multiple Vitamins-Minerals (MULTIVITAMIN MEN PO) Take 1 tablet by mouth daily.     Omega-3 Fatty Acids (OMEGA 3 PO) Take 2 tablets by mouth daily.     Sodium Chloride-Sodium Bicarb 1.57 g PACK Place into the  nose. Uses every morning     STIOLTO RESPIMAT 2.5-2.5 MCG/ACT AERS INHALE 2 PUFFS BY MOUTH INTO THE LUNGS DAILY 4 g 5   vitamin B-12 (V-R VITAMIN B-12) 500 MCG tablet Take 1 tablet (500 mcg total) by mouth daily.     No facility-administered medications prior to visit.   Review of Systems  Review of Systems  Constitutional: Negative.   HENT:  Positive for congestion and postnasal drip.   Respiratory:  Positive for cough and wheezing. Negative for shortness of breath.     Physical Exam  BP 122/66 (BP Location: Left Arm, Patient Position: Sitting, Cuff Size: Normal)   Pulse 75   Temp 97.7 F (36.5 C) (Oral)   Ht 5\' 10"  (1.778 m)   Wt 206 lb 6.4 oz (93.6 kg)   SpO2 95%   BMI 29.62 kg/m  Physical Exam Constitutional:      Appearance: Normal appearance.  HENT:     Head: Normocephalic and atraumatic.     Right Ear: Tympanic membrane and external ear normal. There is no impacted cerumen.     Left Ear: Tympanic membrane and external ear normal. There is no impacted cerumen.     Mouth/Throat:     Mouth: Mucous membranes are moist.     Pharynx: Oropharynx is clear.  Cardiovascular:     Rate and Rhythm: Normal rate and regular rhythm.  Pulmonary:     Effort: Pulmonary effort is normal.     Breath sounds: Normal breath sounds. No wheezing, rhonchi or rales.     Comments: CTA Musculoskeletal:     Cervical back: Normal range of motion and neck supple.  Neurological:     General: No focal deficit present.     Mental Status: He is alert and oriented to person, place, and time. Mental status is at baseline.  Psychiatric:        Mood and Affect: Mood normal.        Behavior: Behavior normal.        Thought Content: Thought content normal.        Judgment: Judgment normal.     Lab Results:  CBC    Component Value Date/Time   WBC 5.0 02/10/2020 1154   RBC 5.46 02/10/2020 1154   HGB 14.4 02/10/2020 1154   HCT 43.0 02/10/2020 1154   PLT 239.0 02/10/2020 1154   MCV 78.7  02/10/2020 1154   MCHC 33.5 02/10/2020 1154   RDW 13.0 02/10/2020 1154   LYMPHSABS 1.7 02/10/2020 1154   MONOABS 0.6 02/10/2020 1154   EOSABS 0.1 02/10/2020 1154   BASOSABS 0.0 02/10/2020 1154    BMET    Component Value Date/Time   NA 138 02/10/2020 1154   K 4.4 02/10/2020 1154   CL 104 02/10/2020 1154   CO2 27 02/10/2020 1154   GLUCOSE 80 02/10/2020 1154   BUN 18 02/10/2020 1154   CREATININE 1.00 02/10/2020 1154   CALCIUM 8.8 02/10/2020 1154   GFRNONAA 83.67 11/03/2009 0916   GFRAA 152 05/04/2006 1553    BNP No results found for: BNP  ProBNP No results found for: PROBNP  Imaging: No results  found.   Assessment & Plan:   Asthmatic bronchitis with acute exacerbation - Patient has had symptoms for over a week. No improvement with OTC cough suppressants or expectorants. Cough is productive with purulent mucus. VSS, Lungs were clear on exam. Sending in Rx doxycycline 100mg  BID x 7 days and prednisone taper 40mg  x 2 days; 30mg  x 2 days; 20mg  x 2 days; 10mg  x 2 days for acute exacerbation of asthmatic bronchitis. He can also take Hycodan 58ml at bedtime for cough. Recommend he continue Stiolto two puffs daily and use albuterol hfa prn every 4-6 hours for breakthrough sob//wheezing. FU in January with Dr. .   Seasonal and perennial allergic rhinitis - Use saline nasal rinses 1-2 times daily as needed for nasal congestion    , NP 02/09/2021

## 2021-03-07 ENCOUNTER — Other Ambulatory Visit: Payer: Self-pay | Admitting: Internal Medicine

## 2021-03-15 NOTE — Progress Notes (Signed)
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 ------------------------------------------------    02/24/20- 71 year old male never smoker followed for asthma, allergic rhinitis, complicated by GERD, history of insect venom allergy, peripheral venous insufficiency, (son died of Hermansky- Pudlak syndrome) Proair hfa, EpiPen (beekeeper),Stiolto 2.5, Covid vax- 3 Phizer Flu vax- had Rarely needs rescue hfa. Not sure how much Stiolto helps- doesn't note need, but diligent using 2 puffs daily. We discussed rescue vs maintenance. 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 Little wheeze or cough. Showed me xray demonstrating scoliosis L-S spine. I explained this won't directly impact breathing. Low back pain is being followed.  Daily saline rinse helps nose.   1/10/232- 71 year old male never smoker followed for Asthma, allergic Rhinitis, complicated by GERD, history of insect venom allergy, peripheral venous insufficiency, (son died of Hermansky- Pudlak syndrome), Scoliosis thoracolumbar,  -Proair hfa, EpiPen (beekeeper),Stiolto 2.5, Daily saline nasal rinse, Covid vax- 4 Phizer Flu vax- had Milon Score, NP 12/6 for asthma> doxy, pred taper Doing very well with his asthma.  We discussed meds and we will refill Stiolto and his rescue inhaler.  Stiolto was used intermittently when needed.  He may have had 2 or 3 exacerbations in the past year, mostly with viral infections.  Rhinitis has been fairly well controlled. He keeps an EpiPen because he still works as a Pension scheme manager but has not needed to use it. Doing maintenance  exercises for scoliosis, hoping to avoid surgery.  ROS-see HPI + = positive Constitutional:   No-   weight loss, night sweats, fevers, chills, fatigue, lassitude. HEENT:   No-  headaches, difficulty swallowing, tooth/dental problems, sore throat,       No-  sneezing, itching, ear ache, nasal congestion, post nasal drip,  CV:  No-   chest pain, orthopnea, PND, swelling in lower extremities, anasarca, dizziness, palpitations Resp: +shortness of breath with exertion or at rest.                productive cough,  No non-productive cough,  No- coughing up of blood.               change in color of mucus.  No- wheezing.   Skin: No-   rash or lesions. GI:  No-   heartburn, indigestion, abdominal pain, nausea, vomiting,  GU: MS:  .+ back pain Neuro-     nothing unusual Psych:  No- change in mood or affect. No depression or anxiety.  No memory loss.  OBJ General- Alert, Oriented, Affect-appropriate, Distress- none acute Skin-  Lymphadenopathy- none Head- atraumatic            Eyes- Gross vision intact, PERRLA, conjunctivae clear secretions            Ears- Hearing, canals-normal            Nose- Clear, no-Septal dev, mucus, polyps, erosion, perforation             Throat- Mallampati II, drainage- none, tonsils- atrophic.  Neck- flexible , trachea midline, no stridor , thyroid nl, carotid no bruit Chest - symmetrical excursion , unlabored           Heart/CV- RRR , no murmur , no gallop  ,  no rub, nl s1 s2                           - JVD- none , edema-none, stasis changes- none, varices- none           Lung- cough-none, unlabored , dullness-none, rub- none           Chest wall-  Abd-  Br/ Gen/ Rectal- Not done, not indicated Extrem- cyanosis- none, clubbing, none, atrophy- none, strength- nl.  Neuro- grossly intact to observation

## 2021-03-16 ENCOUNTER — Encounter: Payer: Self-pay | Admitting: Internal Medicine

## 2021-03-16 ENCOUNTER — Ambulatory Visit: Payer: Medicare PPO | Admitting: Internal Medicine

## 2021-03-16 ENCOUNTER — Other Ambulatory Visit: Payer: Self-pay

## 2021-03-16 DIAGNOSIS — M4186 Other forms of scoliosis, lumbar region: Secondary | ICD-10-CM

## 2021-03-16 DIAGNOSIS — J4520 Mild intermittent asthma, uncomplicated: Secondary | ICD-10-CM

## 2021-03-16 MED ORDER — STIOLTO RESPIMAT 2.5-2.5 MCG/ACT IN AERS: INHALATION_SPRAY | RESPIRATORY_TRACT | 12 refills | Status: DC

## 2021-03-16 MED ORDER — ALBUTEROL SULFATE HFA 108 (90 BASE) MCG/ACT IN AERS: INHALATION_SPRAY | RESPIRATORY_TRACT | 12 refills | Status: DC

## 2021-03-16 NOTE — Patient Instructions (Signed)
Stiolto and albuterol hfa refilled  Glad your breathing is doing well. Please call if we can help.

## 2021-03-16 NOTE — Assessment & Plan Note (Signed)
2 or 3 episodes a year when he needs more than his inhalers, usually associated with viral infections.  We discussed medications.  We also discussed pneumonia vaccination status. Plan-refill Stiolto and albuterol.  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

## 2021-03-16 NOTE — Assessment & Plan Note (Signed)
Significant problem being watched closely.  He hopes to avoid or delay surgery as long as possible.

## 2021-06-04 ENCOUNTER — Other Ambulatory Visit: Payer: Self-pay | Admitting: Neurosurgery

## 2021-06-04 DIAGNOSIS — M419 Scoliosis, unspecified: Secondary | ICD-10-CM

## 2021-06-04 DIAGNOSIS — M438X9 Other specified deforming dorsopathies, site unspecified: Secondary | ICD-10-CM

## 2021-06-04 DIAGNOSIS — M5416 Radiculopathy, lumbar region: Secondary | ICD-10-CM

## 2021-06-08 ENCOUNTER — Ambulatory Visit: Payer: Medicare PPO

## 2021-06-08 ENCOUNTER — Telehealth: Payer: Self-pay | Admitting: *Deleted

## 2021-06-08 NOTE — Telephone Encounter (Signed)
Unable to reach patient picked up then hung up then straight to voice mail with call backs lm  ?

## 2021-06-28 ENCOUNTER — Ambulatory Visit
Admission: RE | Admit: 2021-06-28 | Discharge: 2021-06-28 | Disposition: A | Discharge status: Home / Self Care | Payer: Medicare PPO | Attending: Neurosurgery | Admitting: Neurosurgery

## 2021-06-28 ENCOUNTER — Ambulatory Visit
Admission: RE | Admit: 2021-06-28 | Discharge: 2021-06-28 | Disposition: A | Discharge status: Home / Self Care | Payer: Medicare PPO | Source: Ambulatory Visit | Attending: Neurosurgery | Admitting: Neurosurgery

## 2021-06-28 DIAGNOSIS — M5416 Radiculopathy, lumbar region: Secondary | ICD-10-CM | POA: Insufficient documentation

## 2021-06-28 DIAGNOSIS — M4726 Other spondylosis with radiculopathy, lumbar region: Secondary | ICD-10-CM | POA: Diagnosis not present

## 2021-06-28 DIAGNOSIS — M438X9 Other specified deforming dorsopathies, site unspecified: Secondary | ICD-10-CM | POA: Diagnosis not present

## 2021-06-28 DIAGNOSIS — M419 Scoliosis, unspecified: Secondary | ICD-10-CM | POA: Insufficient documentation

## 2021-06-28 DIAGNOSIS — M4186 Other forms of scoliosis, lumbar region: Secondary | ICD-10-CM | POA: Diagnosis not present

## 2021-06-30 ENCOUNTER — Ambulatory Visit (INDEPENDENT_AMBULATORY_CARE_PROVIDER_SITE_OTHER): Payer: Medicare PPO

## 2021-06-30 VITALS — Ht 70.0 in | Wt 209.0 lb

## 2021-06-30 DIAGNOSIS — Z0000 Encounter for general adult medical examination without abnormal findings: Secondary | ICD-10-CM | POA: Diagnosis not present

## 2021-06-30 NOTE — Patient Instructions (Signed)
Mr. Terrence Middleton , ?Thank you for taking time to come for your Medicare Wellness Visit. I appreciate your ongoing commitment to your health goals. Please review the following plan we discussed and let me know if I can assist you in the future.  ? ?Screening recommendations/referrals: ?Colonoscopy: Done 11/29/2013 Repeat in 10 years ? ?Recommended yearly ophthalmology/optometry visit for glaucoma screening and checkup ?Recommended yearly dental visit for hygiene and checkup ? ?Vaccinations: ?Influenza vaccine: Done 12/05/2020 Repeat annually ? ?Pneumococcal vaccine: Done 02/22/2016 and 02/19/2015 ?Tdap vaccine: Done 06/27/2019 Repeat in 10 years ? ?Shingles vaccine: Done 11/12/2012, 03/04/2019. 10/03/2019   ?Covid-19: Done 04/13/2019, 05/04/2019, 12/12/2019, 10/23/2020 and 03/19/2021. ? ?Advanced directives: Please bring a copy of your health care power of attorney and living will to the office to be added to your chart at your convenience. ? ? ?Conditions/risks identified: Aim for 30 minutes of exercise or brisk walking, 6-8 glasses of water, and 5 servings of fruits and vegetables each day. ? ? ?Next appointment: Follow up in one year for your annual wellness visit. 2024.  ? ?Preventive Care 61 Years and Older, Male ? ?Preventive care refers to lifestyle choices and visits with your health care provider that can promote health and wellness. ?What does preventive care include? ?A yearly physical exam. This is also called an annual well check. ?Dental exams once or twice a year. ?Routine eye exams. Ask your health care provider how often you should have your eyes checked. ?Personal lifestyle choices, including: ?Daily care of your teeth and gums. ?Regular physical activity. ?Eating a healthy diet. ?Avoiding tobacco and drug use. ?Limiting alcohol use. ?Practicing safe sex. ?Taking low doses of aspirin every day. ?Taking vitamin and mineral supplements as recommended by your health care provider. ?What happens during an annual well  check? ?The services and screenings done by your health care provider during your annual well check will depend on your age, overall health, lifestyle risk factors, and family history of disease. ?Counseling  ?Your health care provider may ask you questions about your: ?Alcohol use. ?Tobacco use. ?Drug use. ?Emotional well-being. ?Home and relationship well-being. ?Sexual activity. ?Eating habits. ?History of falls. ?Memory and ability to understand (cognition). ?Work and work Statistician. ?Screening  ?You may have the following tests or measurements: ?Height, weight, and BMI. ?Blood pressure. ?Lipid and cholesterol levels. These may be checked every 5 years, or more frequently if you are over 66 years old. ?Skin check. ?Lung cancer screening. You may have this screening every year starting at age 4 if you have a 30-pack-year history of smoking and currently smoke or have quit within the past 15 years. ?Fecal occult blood test (FOBT) of the stool. You may have this test every year starting at age 66. ?Flexible sigmoidoscopy or colonoscopy. You may have a sigmoidoscopy every 5 years or a colonoscopy every 10 years starting at age 79. ?Prostate cancer screening. Recommendations will vary depending on your family history and other risks. ?Hepatitis C blood test. ?Hepatitis B blood test. ?Sexually transmitted disease (STD) testing. ?Diabetes screening. This is done by checking your blood sugar (glucose) after you have not eaten for a while (fasting). You may have this done every 1-3 years. ?Abdominal aortic aneurysm (AAA) screening. You may need this if you are a current or former smoker. ?Osteoporosis. You may be screened starting at age 71 if you are at high risk. ?Talk with your health care provider about your test results, treatment options, and if necessary, the need for more tests. ?Vaccines  ?  Your health care provider may recommend certain vaccines, such as: ?Influenza vaccine. This is recommended every  year. ?Tetanus, diphtheria, and acellular pertussis (Tdap, Td) vaccine. You may need a Td booster every 10 years. ?Zoster vaccine. You may need this after age 60. ?Pneumococcal 13-valent conjugate (PCV13) vaccine. One dose is recommended after age 19. ?Pneumococcal polysaccharide (PPSV23) vaccine. One dose is recommended after age 38. ?Talk to your health care provider about which screenings and vaccines you need and how often you need them. ?This information is not intended to replace advice given to you by your health care provider. Make sure you discuss any questions you have with your health care provider. ?Document Released: 03/20/2015 Document Revised: 11/11/2015 Document Reviewed: 12/23/2014 ?Elsevier Interactive Patient Education ? 2017 [place]. ? ?Fall Prevention in the Home ?Falls can cause injuries. They can happen to people of all ages. There are many things you can do to make your home safe and to help prevent falls. ?What can I do on the outside of my home? ?Regularly fix the edges of walkways and driveways and fix any cracks. ?Remove anything that might make you trip as you walk through a door, such as a raised step or threshold. ?Trim any bushes or trees on the path to your home. ?Use bright outdoor lighting. ?Clear any walking paths of anything that might make someone trip, such as rocks or tools. ?Regularly check to see if handrails are loose or broken. Make sure that both sides of any steps have handrails. ?Any raised decks and porches should have guardrails on the edges. ?Have any leaves, snow, or ice cleared regularly. ?Use sand or salt on walking paths during winter. ?Clean up any spills in your garage right away. This includes oil or grease spills. ?What can I do in the bathroom? ?Use night lights. ?Install grab bars by the toilet and in the tub and shower. Do not use towel bars as grab bars. ?Use non-skid mats or decals in the tub or shower. ?If you need to sit down in the shower, use a  plastic, non-slip stool. ?Keep the floor dry. Clean up any water that spills on the floor as soon as it happens. ?Remove soap buildup in the tub or shower regularly. ?Attach bath mats securely with double-sided non-slip rug tape. ?Do not have throw rugs and other things on the floor that can make you trip. ?What can I do in the bedroom? ?Use night lights. ?Make sure that you have a light by your bed that is easy to reach. ?Do not use any sheets or blankets that are too big for your bed. They should not hang down onto the floor. ?Have a firm chair that has side arms. You can use this for support while you get dressed. ?Do not have throw rugs and other things on the floor that can make you trip. ?What can I do in the kitchen? ?Clean up any spills right away. ?Avoid walking on wet floors. ?Keep items that you use a lot in easy-to-reach places. ?If you need to reach something above you, use a strong step stool that has a grab bar. ?Keep electrical cords out of the way. ?Do not use floor polish or wax that makes floors slippery. If you must use wax, use non-skid floor wax. ?Do not have throw rugs and other things on the floor that can make you trip. ?What can I do with my stairs? ?Do not leave any items on the stairs. ?Make sure that there are  handrails on both sides of the stairs and use them. Fix handrails that are broken or loose. Make sure that handrails are as long as the stairways. ?Check any carpeting to make sure that it is firmly attached to the stairs. Fix any carpet that is loose or worn. ?Avoid having throw rugs at the top or bottom of the stairs. If you do have throw rugs, attach them to the floor with carpet tape. ?Make sure that you have a light switch at the top of the stairs and the bottom of the stairs. If you do not have them, ask someone to add them for you. ?What else can I do to help prevent falls? ?Wear shoes that: ?Do not have high heels. ?Have rubber bottoms. ?Are comfortable and fit you  well. ?Are closed at the toe. Do not wear sandals. ?If you use a stepladder: ?Make sure that it is fully opened. Do not climb a closed stepladder. ?Make sure that both sides of the stepladder are locked into place. ?A

## 2021-06-30 NOTE — Progress Notes (Signed)
? ?Subjective:  ? Terrence Middleton is a 71 y.o. male who presents for Medicare Annual/Subsequent preventive examination. ?Virtual Visit via Telephone Note ? ?I connected with  Terrence DraftGary Ray Jutte on 06/30/21 at  3:00 PM EDT by telephone and verified that I am speaking with the correct person using two identifiers. ? ?Location: ?Patient: HOME ?Provider: [facility] ?Persons participating in the virtual visit: patient/Nurse Health Advisor ?  ?I discussed the limitations, risks, security and privacy concerns of performing an evaluation and management service by telephone and the availability of in person appointments. The patient expressed understanding and agreed to proceed. ? ?Interactive audio and video telecommunications were attempted between this nurse and patient, however failed, due to patient having technical difficulties OR patient did not have access to video capability.  We continued and completed visit with audio only. ? ?Some vital signs may be absent or patient reported.  ? ?Darral DashMary Jane K Aluna Whiston, LPN ? ?Review of Systems    ? ?Cardiac Risk Factors include: advanced age (>1855men, 7>65 women);dyslipidemia;male gender;sedentary lifestyle;Other (see comment), Risk factor comments: Asthma. ? ?   ?Objective:  ?  ?Today's Vitals  ? 06/30/21 1455  ?Weight: 209 lb (94.8 kg)  ?Height: 5\' 10"  (1.778 m)  ? ?Body mass index is 29.99 kg/m?. ? ? ?  06/30/2021  ?  3:09 PM 06/27/2019  ?  1:03 PM 02/18/2014  ? 11:15 AM 11/18/2013  ?  4:32 PM  ?Advanced Directives  ?Does Patient Have a Medical Advance Directive? Yes No Yes No  ?Type of Estate agentAdvance Directive Healthcare Power of [place];Living will     ?Copy of Healthcare Power of Attorney in Chart? No - copy requested     ?Would patient like information on creating a medical advance directive? No - Patient declined No - Patient declined  No - patient declined information  ? ? ?Current Medications (verified) ?Outpatient Encounter Medications as of 06/30/2021  ?Medication Sig  ? albuterol  (PROAIR HFA) 108 (90 Base) MCG/ACT inhaler INHALE 2 PUFFS INTO THE LUNGS EVERY 4 (FOUR) HOURS AS NEEDED FOR WHEEZING OR SHORTNESS OF BREATH.  ? dicyclomine (BENTYL) 10 MG capsule TAKE 1 CAPSULE (10 MG TOTAL) BY MOUTH 2 (TWO) TIMES DAILY AS NEEDED FOR SPASMS.  ? EPINEPHrine 0.3 mg/0.3 mL IJ SOAJ injection Inject into thigh for severe allergic reaction  ? famotidine (PEPCID) 20 MG tablet Take 20 mg by mouth daily.  ? MISC NATURAL PRODUCTS PO Take 500 mg by mouth daily. Tumeric  ? Multiple Vitamins-Minerals (MULTIVITAMIN MEN PO) Take 1 tablet by mouth daily.  ? Omega-3 Fatty Acids (OMEGA 3 PO) Take 2 tablets by mouth daily.  ? Sodium Chloride-Sodium Bicarb 1.57 g PACK Place into the nose. Uses every morning  ? Tiotropium Bromide-Olodaterol (STIOLTO RESPIMAT) 2.5-2.5 MCG/ACT AERS INHALE 2 PUFFS BY MOUTH INTO THE LUNGS DAILY  ? vitamin B-12 (V-R VITAMIN B-12) 500 MCG tablet Take 1 tablet (500 mcg total) by mouth daily.  ? ?No facility-administered encounter medications on file as of 06/30/2021.  ? ? ?Allergies (verified) ?Bee venom and Sulfonamide derivatives  ? ?History: ?Past Medical History:  ?Diagnosis Date  ? ALLERGIC RHINITIS 02/14/2008  ? Young MD, Joni Fearslinton D   ? Allergy to insect stings 02/10/2011  ? Hymenoptera sting   ? Asthma   ? exercise induced per patient Maple [place](Young)  ? GERD (gastroesophageal reflux disease)   ? PVD (posterior vitreous detachment), left   ? ?Past Surgical History:  ?Procedure Laterality Date  ? cervical fracture  2008  ?  fractured c3 after fall  ? COLONOSCOPY  05/2006  ? colon polyps Juanda Chance)  ? COLONOSCOPY  11/2013  ? WNL Juanda Chance)  ? ?Family History  ?Problem Relation Age of Onset  ? Asthma Mother   ? COPD Mother   ? Emphysema Father   ? Lymphoma Father 14  ? Crohn's disease Son   ?     Colonectomy  ? Pulmonary disease Son 62  ?     passed away - Hermansky-Pudlak syndrome  ? Colon cancer Neg Hx   ? ?Social History  ? ?Socioeconomic History  ? Marital status: Married  ?  Spouse name: Not on file  ?  Number of children: 1  ? Years of education: Not on file  ? Highest education level: Not on file  ?Occupational History  ? Occupation: Art gallery manager for [employer]  ?Tobacco Use  ? Smoking status: Never  ? Smokeless tobacco: Never  ?Vaping Use  ? Vaping Use: Never used  ?Substance and Sexual Activity  ? Alcohol use: Yes  ?  Alcohol/week: 5.0 - 6.0 standard drinks  ?  Types: 5 - 6 Cans of beer per week  ?  Comment: occasionally beer  ? Drug use: No  ? Sexual activity: Not on file  ?Other Topics Concern  ? Not on file  ?Social History Narrative  ? Lives with wife, dog Manufacturing engineer).  Grown children.  ? 33yo son [name] passed away 2014-07-08 (pulm fibrosis - Hermanske-Pudlike syndrome)  ? Occupation: Technical sales engineer at [employer]   ? Beekeeper  ? Activity: no regular exercise.  Does walk at work and stays active at home.  ? Diet: good water, fruits/vegetables daily  ? ?Social Determinants of Health  ? ?Financial Resource Strain: Low Risk   ? Difficulty of Paying Living Expenses: Not hard at all  ?Food Insecurity: No Food Insecurity  ? Worried About Programme researcher, broadcasting/film/video in the Last Year: Never true  ? Ran Out of Food in the Last Year: Never true  ?Transportation Needs: No Transportation Needs  ? Lack of Transportation (Medical): No  ? Lack of Transportation (Non-Medical): No  ?Physical Activity: Sufficiently Active  ? Days of Exercise per Week: 5 days  ? Minutes of Exercise per Session: 30 min  ?Stress: No Stress Concern Present  ? Feeling of Stress : Not at all  ?Social Connections: Moderately Integrated  ? Frequency of Communication with Friends and Family: More than three times a week  ? Frequency of Social Gatherings with Friends and Family: More than three times a week  ? Attends Religious Services: Never  ? Active Member of Clubs or Organizations: Yes  ? Attends Banker Meetings: More than 4 times per year  ? Marital Status: Married  ? ? ?Tobacco Counseling ?Counseling given: Not Answered ? ? ?Clinical  Intake: ? ?Pre-visit preparation completed: Yes ? ?Pain : No/denies pain ? ?  ? ?BMI - recorded: 29.99 ?Nutritional Status: BMI 25 -29 Overweight ?Nutritional Risks: None ?Diabetes: No ? ?How often do you need to have someone help you when you read instructions, pamphlets, or other written materials from your doctor or pharmacy?: 1 - Never ? ?Diabetic?no ? ?Interpreter Needed?: No ? ?Information entered by :: mj Whitten Andreoni, lpn ? ? ?Activities of Daily Living ? ?  06/30/2021  ?  3:11 PM  ?In your present state of health, do you have any difficulty performing the following activities:  ?Hearing? 0  ?Vision? 0  ?Difficulty concentrating or making decisions? 0  ?Walking or  climbing stairs? 0  ?Dressing or bathing? 0  ?Doing errands, shopping? 0  ?Preparing Food and eating ? N  ?Using the Toilet? N  ?In the past six months, have you accidently leaked urine? N  ?Do you have problems with loss of bowel control? N  ?Managing your Medications? N  ?Managing your Finances? N  ?Housekeeping or managing your Housekeeping? N  ? ? ?Patient Care Team: ?Eustaquio Boyden, MD as PCP - General (Family Medicine) ? ?Indicate any recent Medical Services you may have received from other than [facility] providers in the past year (date may be approximate). ? ?   ?Assessment:  ? This is a routine wellness examination for Clavin. ? ?Hearing/Vision screen ?Hearing Screening - Comments:: Some hearing issues L ear.  ?Vision Screening - Comments:: Glasses. [facility] 04/2021. ? ?Dietary issues and exercise activities discussed: ?Current Exercise Habits: Home exercise routine, Type of exercise: walking, Time (Minutes): 30, Frequency (Times/Week): 5, Weekly Exercise (Minutes/Week): 150, Intensity: Mild, Exercise limited by: cardiac condition(s);respiratory conditions(s) ? ? Goals Addressed   ? ?  ?  ?  ?  ? This Visit's Progress  ?  Exercise 3x per week (30 min per time)     ?  Continue to stay active and healthy. ?  ? ?  ? ?Depression Screen ? ?  06/30/2021   ?  3:02 PM 02/26/2020  ?  4:05 PM 01/29/2019  ?  9:59 AM 01/19/2018  ?  2:23 PM 12/27/2016  ? 11:57 AM  ?PHQ 2/9 Scores  ?PHQ - 2 Score 0 0 0 0 5  ?PHQ- 9 Score     6  ?  ?Fall Risk ? ?  06/30/2021  ?  3:10 PM 12/22/

## 2021-07-01 DIAGNOSIS — M5416 Radiculopathy, lumbar region: Secondary | ICD-10-CM | POA: Diagnosis not present

## 2021-07-01 DIAGNOSIS — M419 Scoliosis, unspecified: Secondary | ICD-10-CM | POA: Diagnosis not present

## 2021-07-01 DIAGNOSIS — M438X9 Other specified deforming dorsopathies, site unspecified: Secondary | ICD-10-CM | POA: Diagnosis not present

## 2021-07-24 ENCOUNTER — Other Ambulatory Visit: Payer: Self-pay | Admitting: Family Medicine

## 2021-07-24 DIAGNOSIS — N401 Enlarged prostate with lower urinary tract symptoms: Secondary | ICD-10-CM

## 2021-07-24 DIAGNOSIS — E538 Deficiency of other specified B group vitamins: Secondary | ICD-10-CM

## 2021-07-24 DIAGNOSIS — E785 Hyperlipidemia, unspecified: Secondary | ICD-10-CM

## 2021-07-28 ENCOUNTER — Other Ambulatory Visit (INDEPENDENT_AMBULATORY_CARE_PROVIDER_SITE_OTHER): Payer: Medicare PPO

## 2021-07-28 DIAGNOSIS — R3914 Feeling of incomplete bladder emptying: Secondary | ICD-10-CM | POA: Diagnosis not present

## 2021-07-28 DIAGNOSIS — E785 Hyperlipidemia, unspecified: Secondary | ICD-10-CM | POA: Diagnosis not present

## 2021-07-28 DIAGNOSIS — E538 Deficiency of other specified B group vitamins: Secondary | ICD-10-CM | POA: Diagnosis not present

## 2021-07-28 DIAGNOSIS — N401 Enlarged prostate with lower urinary tract symptoms: Secondary | ICD-10-CM | POA: Diagnosis not present

## 2021-07-28 LAB — COMPREHENSIVE METABOLIC PANEL
ALT: 15 U/L (ref 0–53)
AST: 12 U/L (ref 0–37)
Albumin: 4.4 g/dL (ref 3.5–5.2)
Alkaline Phosphatase: 45 U/L (ref 39–117)
BUN: 16 mg/dL (ref 6–23)
CO2: 28 mEq/L (ref 19–32)
Calcium: 9.2 mg/dL (ref 8.4–10.5)
Chloride: 104 mEq/L (ref 96–112)
Creatinine, Ser: 0.98 mg/dL (ref 0.40–1.50)
GFR: 78.88 mL/min (ref >60.00)
Glucose, Bld: 105 mg/dL — ABNORMAL HIGH (ref 70–99)
Potassium: 5.1 mEq/L (ref 3.5–5.1)
Sodium: 141 mEq/L (ref 135–145)
Total Bilirubin: 0.5 mg/dL (ref 0.2–1.2)
Total Protein: 6.8 g/dL (ref 6.0–8.3)

## 2021-07-28 LAB — PSA: PSA: 1.28 ng/mL (ref 0.10–4.00)

## 2021-07-28 LAB — LIPID PANEL
Cholesterol: 151 mg/dL (ref 0–200)
HDL: 37.1 mg/dL — ABNORMAL LOW (ref >39.00)
LDL Cholesterol: 100 mg/dL — ABNORMAL HIGH (ref 0–99)
NonHDL: 114.14
Total CHOL/HDL Ratio: 4
Triglycerides: 73 mg/dL (ref 0.0–149.0)
VLDL: 14.6 mg/dL (ref 0.0–40.0)

## 2021-07-28 LAB — VITAMIN B12: Vitamin B-12: 692 pg/mL (ref 211–911)

## 2021-08-04 ENCOUNTER — Ambulatory Visit (INDEPENDENT_AMBULATORY_CARE_PROVIDER_SITE_OTHER): Payer: Medicare PPO | Admitting: Family Medicine

## 2021-08-04 ENCOUNTER — Encounter: Payer: Self-pay | Admitting: Family Medicine

## 2021-08-04 VITALS — BP 132/70 | HR 79 | Temp 97.3°F | Ht 69.0 in | Wt 203.2 lb

## 2021-08-04 DIAGNOSIS — K219 Gastro-esophageal reflux disease without esophagitis: Secondary | ICD-10-CM

## 2021-08-04 DIAGNOSIS — E538 Deficiency of other specified B group vitamins: Secondary | ICD-10-CM

## 2021-08-04 DIAGNOSIS — M4186 Other forms of scoliosis, lumbar region: Secondary | ICD-10-CM

## 2021-08-04 DIAGNOSIS — Z0000 Encounter for general adult medical examination without abnormal findings: Secondary | ICD-10-CM

## 2021-08-04 DIAGNOSIS — I7300 Raynaud's syndrome without gangrene: Secondary | ICD-10-CM

## 2021-08-04 DIAGNOSIS — J302 Other seasonal allergic rhinitis: Secondary | ICD-10-CM

## 2021-08-04 DIAGNOSIS — J3089 Other allergic rhinitis: Secondary | ICD-10-CM | POA: Diagnosis not present

## 2021-08-04 DIAGNOSIS — J4520 Mild intermittent asthma, uncomplicated: Secondary | ICD-10-CM | POA: Diagnosis not present

## 2021-08-04 DIAGNOSIS — Z7189 Other specified counseling: Secondary | ICD-10-CM

## 2021-08-04 DIAGNOSIS — E785 Hyperlipidemia, unspecified: Secondary | ICD-10-CM | POA: Diagnosis not present

## 2021-08-04 MED ORDER — TURMERIC 500 MG PO CAPS: 1.0000 | ORAL_CAPSULE | Freq: Every day | ORAL | Status: DC

## 2021-08-04 NOTE — Assessment & Plan Note (Signed)
Followed by pulmonology 

## 2021-08-04 NOTE — Assessment & Plan Note (Signed)
Advanced directives discussion: has all this at home. Wife is HCPOA. Asked to bring us copy.  

## 2021-08-04 NOTE — Assessment & Plan Note (Addendum)
Chronic, stable off medication besides fish oil.  The 10-year ASCVD risk score (Arnett DK, et al., 2019) is: 17.6%   Values used to calculate the score:     Age: 71 years     Sex: Male     Is Non-Hispanic African American: No     Diabetic: No     Tobacco smoker: No     Systolic Blood Pressure: 132 mmHg     Is BP treated: No     HDL Cholesterol: 37.1 mg/dL     Total Cholesterol: 151 mg/dL

## 2021-08-04 NOTE — Patient Instructions (Addendum)
Bring [place] a copy of your advanced directive to update your chart.  You are doing well today Return as needed or in 1 year for next physical.  May try melatonin 3-5mg  at night as needed for sleep.   Health Maintenance After Age 69 After age 33, you are at a higher risk for certain long-term diseases and infections as well as injuries from falls. Falls are a major cause of broken bones and head injuries in people who are older than age 46. Getting regular preventive care can help to keep you healthy and well. Preventive care includes getting regular testing and making lifestyle changes as recommended by your health care provider. Talk with your health care provider about: Which screenings and tests you should have. A screening is a test that checks for a disease when you have no symptoms. A diet and exercise plan that is right for you. What should I know about screenings and tests to prevent falls? Screening and testing are the best ways to find a health problem early. Early diagnosis and treatment give you the best chance of managing medical conditions that are common after age 3. Certain conditions and lifestyle choices may make you more likely to have a fall. Your health care provider may recommend: Regular vision checks. Poor vision and conditions such as cataracts can make you more likely to have a fall. If you wear glasses, make sure to get your prescription updated if your vision changes. Medicine review. Work with your health care provider to regularly review all of the medicines you are taking, including over-the-counter medicines. Ask your health care provider about any side effects that may make you more likely to have a fall. Tell your health care provider if any medicines that you take make you feel dizzy or sleepy. Strength and balance checks. Your health care provider may recommend certain tests to check your strength and balance while standing, walking, or changing positions. Foot health  exam. Foot pain and numbness, as well as not wearing proper footwear, can make you more likely to have a fall. Screenings, including: Osteoporosis screening. Osteoporosis is a condition that causes the bones to get weaker and break more easily. Blood pressure screening. Blood pressure changes and medicines to control blood pressure can make you feel dizzy. Depression screening. You may be more likely to have a fall if you have a fear of falling, feel depressed, or feel unable to do activities that you used to do. Alcohol use screening. Using too much alcohol can affect your balance and may make you more likely to have a fall. Follow these instructions at home: Lifestyle Do not drink alcohol if: Your health care provider tells you not to drink. If you drink alcohol: Limit how much you have to: 0-1 drink a day for women. 0-2 drinks a day for men. Know how much alcohol is in your drink. In the [place] one drink equals one 12 oz bottle of beer (355 mL), one 5 oz glass of wine (148 mL), or one 1 oz glass of hard liquor (44 mL). Do not use any products that contain nicotine or tobacco. These products include cigarettes, chewing tobacco, and vaping devices, such as e-cigarettes. If you need help quitting, ask your health care provider. Activity  Follow a regular exercise program to stay fit. This will help you maintain your balance. Ask your health care provider what types of exercise are appropriate for you. If you need a cane or walker, use it as recommended by  your health care provider. Wear supportive shoes that have nonskid soles. Safety  Remove any tripping hazards, such as rugs, cords, and clutter. Install safety equipment such as grab bars in bathrooms and safety rails on stairs. Keep rooms and walkways well-lit. General instructions Talk with your health care provider about your risks for falling. Tell your health care provider if: You fall. Be sure to tell your health care provider  about all falls, even ones that seem minor. You feel dizzy, tiredness (fatigue), or off-balance. Take over-the-counter and prescription medicines only as told by your health care provider. These include supplements. Eat a healthy diet and maintain a healthy weight. A healthy diet includes low-fat dairy products, low-fat (lean) meats, and fiber from whole grains, beans, and lots of fruits and vegetables. Stay current with your vaccines. Schedule regular health, dental, and eye exams. Summary Having a healthy lifestyle and getting preventive care can help to protect your health and wellness after age 93. Screening and testing are the best way to find a health problem early and help you avoid having a fall. Early diagnosis and treatment give you the best chance for managing medical conditions that are more common for people who are older than age 72. Falls are a major cause of broken bones and head injuries in people who are older than age 75. Take precautions to prevent a fall at home. Work with your health care provider to learn what changes you can make to improve your health and wellness and to prevent falls. This information is not intended to replace advice given to you by your health care provider. Make sure you discuss any questions you have with your health care provider. Document Revised: 07/13/2020 Document Reviewed: 07/13/2020 Elsevier Patient Education  2023 [employer].

## 2021-08-04 NOTE — Assessment & Plan Note (Signed)
Well replaced with 596mcg daily.

## 2021-08-04 NOTE — Progress Notes (Signed)
Patient ID: Terrence Middleton, male    DOB: [birth year], 71 y.o.   MRN: [id number]  This visit was conducted in person.  BP 132/70   Pulse 79   Temp (!) 97.3 F (36.3 C) (Temporal)   Ht 5\' 9"  (1.753 m)   Wt 203 lb 4 oz (92.2 kg)   SpO2 97%   BMI 30.01 kg/m    CC: CPE Subjective:   HPI: Terrence Middleton is a 71 y.o. male presenting on 08/04/2021 for Annual Exam [facility](MCR prt 2. C/o numbness in right index finger, usually in cold weather. Started about 6 mos ago. )   Saw health advisor last month for medicare wellness visit. Note reviewed. Cognitive assessment not performed.    No results found.  Flowsheet Row Clinical Support from 06/30/2021 in [facility] [place]  PHQ-2 Total Score 0          06/30/2021    3:10 PM 02/26/2020    4:04 PM 01/29/2019    9:59 AM 01/19/2018    2:22 PM  Fall Risk   Falls in the past year? 0 0 0 0  Number falls in past yr: 0     Injury with Fall? 0     Risk for fall due to : No Fall Risks     Follow up Falls prevention discussed       Known lumbar scoliosis, DDD with R radiculopathy, facet arthropathy.  MRI Lumbar spine without contrast 08/2020 IMPRESSION: 1. Dextroscoliosis and spondylosis resulting in multilevel neural foraminal narrowing, severe on the right at L4-5 and L5-S1, and moderate on the left at L3-4. 2. No high-grade spinal canal stenosis at any level. Established with neurosurgery Dr Marcell [facility] at [facility] - back pain improved with ESI and PT for months.   Continues dicyclomine for suspected colon spasms - started by GI.  May need scouting forms filled out this year.   Tip of R index finger can turn white, cold, numb in cold weather. No other fingers affected.   Preventative: COLONOSCOPY Date: 11/2013 WNL good for 10 yrs Juanda Chance(Brodie) Prostate screening - pt would like to continue yearly DRE/PSA. Nocturia x1.  Lung cancer screening - not eligible  Flu shot yearly COVID vaccine Pfizer 04/2019 x2, booster 12/2019,  10/2020, bivalent 03/2021 Td 2007. Tdap 2017, 06/2019 Pneumovax - 02/2015, prevnar-13 2017 zostavax - 11/2012 shingrix - 02/2019, 09/2019 Advanced directives discussion: has all this at home. Wife is HCPOA. Asked to bring us copy.  Seat belt use discussed.  Sunscreen use discussed. No changing moles on skin. Sees Dr Yetta BarreJones had BCC removed.  Sleep - averaging 6-7 hours/night  Non smoker. Previous chewing tobacco.  Alcohol - 1 light beer a few times a week Dentist q6 mo  Eye exam yearly  Bowel - no constipation  Bladder - no incontinence    Lives with wife, dog Manufacturing engineer(Beagle).  Grown children. 33yo Terrence AppleBen son passed away 2016 (pulm fibrosis - Hermanske-Pudlike syndrome)  Occupation: retired - was Technical sales engineerautomation engineer at [employer]  Activity: no regular exercise. Does walk at work and stays active at home.  Diet: good water, fruits/vegetables daily     Relevant past medical, surgical, family and social history reviewed and updated as indicated. Interim medical history since our last visit reviewed. Allergies and medications reviewed and updated. Outpatient Medications Prior to Visit  Medication Sig Dispense Refill   albuterol (PROAIR HFA) 108 (90 Base) MCG/ACT inhaler INHALE 2 PUFFS INTO THE LUNGS EVERY 4 (FOUR) HOURS  AS NEEDED FOR WHEEZING OR SHORTNESS OF BREATH. 18 g 12   dicyclomine (BENTYL) 10 MG capsule TAKE 1 CAPSULE (10 MG TOTAL) BY MOUTH 2 (TWO) TIMES DAILY AS NEEDED FOR SPASMS. 180 capsule 1   EPINEPHrine 0.3 mg/0.3 mL IJ SOAJ injection Inject into thigh for severe allergic reaction 1 each 12   famotidine (PEPCID) 20 MG tablet Take 20 mg by mouth daily.     MISC NATURAL PRODUCTS PO Take 500 mg by mouth daily. Tumeric     Multiple Vitamins-Minerals (MULTIVITAMIN MEN PO) Take 1 tablet by mouth daily.     Omega-3 Fatty Acids (OMEGA 3 PO) Take 2 tablets by mouth daily.     Sodium Chloride-Sodium Bicarb 1.57 g PACK Place into the nose. Uses every morning     Tiotropium Bromide-Olodaterol  (STIOLTO RESPIMAT) 2.5-2.5 MCG/ACT AERS INHALE 2 PUFFS BY MOUTH INTO THE LUNGS DAILY 4 g 12   vitamin B-12 (V-R VITAMIN B-12) 500 MCG tablet Take 1 tablet (500 mcg total) by mouth daily.     No facility-administered medications prior to visit.     Per HPI unless specifically indicated in ROS section below Review of Systems  Constitutional:  Negative for activity change, appetite change, chills, fatigue, fever and unexpected weight change.  HENT:  Negative for hearing loss.   Eyes:  Negative for visual disturbance.  Respiratory:  Negative for cough, chest tightness, shortness of breath and wheezing.   Cardiovascular:  Positive for leg swelling (uses compression socks). Negative for chest pain and palpitations.  Gastrointestinal:  Negative for abdominal distention, abdominal pain, blood in stool, constipation, diarrhea, nausea and vomiting.  Genitourinary:  Negative for difficulty urinating and hematuria.  Musculoskeletal:  Negative for arthralgias, myalgias and neck pain.  Skin:  Negative for rash.  Neurological:  Negative for dizziness, seizures, syncope and headaches.  Hematological:  Negative for adenopathy. Does not bruise/bleed easily.  Psychiatric/Behavioral:  Negative for dysphoric mood. The patient is not nervous/anxious.    Objective:  BP 132/70   Pulse 79   Temp (!) 97.3 F (36.3 C) (Temporal)   Ht 5\' 9"  (1.753 m)   Wt 203 lb 4 oz (92.2 kg)   SpO2 97%   BMI 30.01 kg/m   Wt Readings from Last 3 Encounters:  08/04/21 203 lb 4 oz (92.2 kg)  06/30/21 209 lb (94.8 kg)  03/16/21 209 lb 6.4 oz (95 kg)      Physical Exam Vitals and nursing note reviewed.  Constitutional:      General: He is not in acute distress.    Appearance: Normal appearance. He is well-developed. He is not ill-appearing.  HENT:     Head: Normocephalic and atraumatic.     Right Ear: Hearing, tympanic membrane, ear canal and external ear normal.     Left Ear: Hearing, tympanic membrane, ear canal and  external ear normal.  Eyes:     General: No scleral icterus.    Extraocular Movements: Extraocular movements intact.     Conjunctiva/sclera: Conjunctivae normal.     Pupils: Pupils are equal, round, and reactive to light.  Neck:     Thyroid: No thyroid mass or thyromegaly.     Vascular: No carotid bruit.  Cardiovascular:     Rate and Rhythm: Normal rate and regular rhythm.     Pulses: Normal pulses.          Radial pulses are 2+ on the right side and 2+ on the left side.     Heart sounds: Normal  heart sounds. No murmur heard. Pulmonary:     Effort: Pulmonary effort is normal. No respiratory distress.     Breath sounds: Normal breath sounds. No wheezing, rhonchi or rales.  Abdominal:     General: Bowel sounds are normal. There is no distension.     Palpations: Abdomen is soft. There is no mass.     Tenderness: There is no abdominal tenderness. There is no guarding or rebound.     Hernia: No hernia is present.  Musculoskeletal:        General: Normal range of motion.     Cervical back: Normal range of motion and neck supple.     Right lower leg: No edema.     Left lower leg: No edema.     Comments: Dextroscoliosis present  Lymphadenopathy:     Cervical: No cervical adenopathy.  Skin:    General: Skin is warm and dry.     Findings: No rash.  Neurological:     General: No focal deficit present.     Mental Status: He is alert and oriented to person, place, and time.     Comments:  Recall 3/3 Calculation 5/5 DLROW  Psychiatric:        Mood and Affect: Mood normal.        Behavior: Behavior normal.        Thought Content: Thought content normal.        Judgment: Judgment normal.      Results for orders placed or performed in visit on 07/28/21  Vitamin B12  Result Value Ref Range   Vitamin B-12 692 211 - 911 pg/mL  PSA  Result Value Ref Range   PSA 1.28 0.10 - 4.00 ng/mL  Comprehensive metabolic panel  Result Value Ref Range   Sodium 141 135 - 145 mEq/L   Potassium 5.1  3.5 - 5.1 mEq/L   Chloride 104 96 - 112 mEq/L   CO2 28 19 - 32 mEq/L   Glucose, Bld 105 (H) 70 - 99 mg/dL   BUN 16 6 - 23 mg/dL   Creatinine, Ser 0.98 0.40 - 1.50 mg/dL   Total Bilirubin 0.5 0.2 - 1.2 mg/dL   Alkaline Phosphatase 45 39 - 117 U/L   AST 12 0 - 37 U/L   ALT 15 0 - 53 U/L   Total Protein 6.8 6.0 - 8.3 g/dL   Albumin 4.4 3.5 - 5.2 g/dL   GFR 11.91 >47.82 mL/min   Calcium 9.2 8.4 - 10.5 mg/dL  Lipid panel  Result Value Ref Range   Cholesterol 151 0 - 200 mg/dL   Triglycerides 95.6 0.0 - 149.0 mg/dL   HDL 21.30 (L) >86.57 mg/dL   VLDL 84.6 0.0 - 96.2 mg/dL   LDL Cholesterol 952 (H) 0 - 99 mg/dL   Total CHOL/HDL Ratio 4    NonHDL 114.14     Assessment & Plan:   Problem List Items Addressed This Visit     Advanced care planning/counseling discussion (Chronic)    Advanced directives discussion: has all this at home. Wife is HCPOA. Asked to bring [place] copy.       Health maintenance examination - Primary (Chronic)    Preventative protocols reviewed and updated unless pt declined. Discussed healthy diet and lifestyle.        Seasonal and perennial allergic rhinitis    Followed by pulmonology       GERD    Chronic, stable on pepcid.        Dyslipidemia  Chronic, stable off medication besides fish oil.  The 10-year ASCVD risk score (Arnett DK, et al., 2019) is: 17.6%   Values used to calculate the score:     Age: 65 years     Sex: Male     Is Non-Hispanic African American: No     Diabetic: No     Tobacco smoker: No     Systolic Blood Pressure: 132 mmHg     Is BP treated: No     HDL Cholesterol: 37.1 mg/dL     Total Cholesterol: 151 mg/dL        Low serum vitamin B12    Well replaced with daily.        Lumbar scoliosis    Saw neurosurgeon, appreciate their care.  Stable period.        Allergic asthma, mild intermittent, uncomplicated    Followed by pulmonology.        Raynauds phenomenon    Describes raynaud's to R index  finger.  Discussed keeping finger warm, glove use, etc.  To let me know if desires trial of CCB (amlodipine).          Meds ordered this encounter  Medications   Turmeric 500 MG CAPS    Sig: Take 1 capsule by mouth daily.   No orders of the defined types were placed in this encounter.   Patient instructions: Bring [place] a copy of your advanced directive to update your chart.  You are doing well today Return as needed or in 1 year for next physical.  May try melatonin 3-5mg  at night as needed for sleep.   Follow up plan: Return in about 1 year (around 08/05/2022) for annual exam, prior fasting for blood work, medicare wellness visit.  Eustaquio Boyden, MD

## 2021-08-04 NOTE — Assessment & Plan Note (Signed)
Saw neurosurgeon, appreciate their care.  Stable period.

## 2021-08-04 NOTE — Assessment & Plan Note (Signed)
Preventative protocols reviewed and updated unless pt declined. Discussed healthy diet and lifestyle.  

## 2021-08-04 NOTE — Assessment & Plan Note (Signed)
Describes raynaud's to R index finger.  Discussed keeping finger warm, glove use, etc.  To let me know if desires trial of CCB (amlodipine).

## 2021-08-04 NOTE — Assessment & Plan Note (Signed)
Chronic, stable on pepcid.

## 2021-09-03 ENCOUNTER — Telehealth: Payer: Self-pay | Admitting: Family Medicine

## 2021-09-03 NOTE — Telephone Encounter (Signed)
Patient left physical form to be completed by PCP. Form left in PCP folder.

## 2021-09-06 NOTE — Telephone Encounter (Signed)
Placed form in Dr. G's box.  

## 2021-09-06 NOTE — Telephone Encounter (Signed)
Filled and in Lisa's box 

## 2021-09-06 NOTE — Telephone Encounter (Signed)
Lvm asking pt to call back.  Need to notify him his physical form is ready to pick up.   [Placed form at front office.  Made copy to scan.]

## 2021-09-08 NOTE — Telephone Encounter (Signed)
Patient aware.

## 2021-09-08 NOTE — Telephone Encounter (Signed)
Noted  

## 2021-09-08 NOTE — Telephone Encounter (Signed)
Patient is authorizing his wife, Faust, [place] "Olegario Messier" to pick up the form.

## 2021-11-16 ENCOUNTER — Telehealth (INDEPENDENT_AMBULATORY_CARE_PROVIDER_SITE_OTHER): Payer: Medicare PPO | Admitting: Family

## 2021-11-16 VITALS — Temp 100.5°F | Ht 69.0 in | Wt 203.2 lb

## 2021-11-16 DIAGNOSIS — U071 COVID-19: Secondary | ICD-10-CM | POA: Insufficient documentation

## 2021-11-16 MED ORDER — MOLNUPIRAVIR EUA 200MG CAPSULE: 4.0000 | ORAL_CAPSULE | Freq: Two times a day (BID) | ORAL | 0 refills | Status: AC

## 2021-11-16 NOTE — Assessment & Plan Note (Signed)
Pt considered high risk for hospitalization. have decided pt is a candidate for antiviral and pt agrees that she would like to take this. I have sent in RX for molnupiravir 200 mg capsules to be taken as directed. ?Advised of CDC guidelines for self isolation/ ending isolation.  Advised of safe practice guidelines. Symptom Tier reviewed.  Encouraged to monitor for any worsening symptoms; watch for increased shortness of breath, weakness, and signs of dehydration. Advised when to seek emergency care.  Instructed to rest and hydrate well.  Advised to leave the house during recommended isolation period, only if it is necessary to seek medical care ? ? ? ?

## 2021-11-16 NOTE — Patient Instructions (Signed)
 ------------------------------------   Your COVID test is positive. You should remain isolated and quarantine for at least 5 days from start of symptoms. You must be feeling better and be fever free without any fever reducers for at least 24 hours as well. You should wear a mask at all times when out of your home or around others for 5 days after leaving isolation.  Your household contacts should be tested as well as work contacts. If you feel worse or have increasing shortness of breath, you should be seen in person at urgent care or the emergency room.   ------------------------------------   Regards,   Mililani Murthy FNP-C  

## 2021-11-16 NOTE — Progress Notes (Signed)
MyChart Video Visit    Virtual Visit via Video Note   This visit type was conducted due to national recommendations for restrictions regarding the COVID-19 Pandemic (e.g. social distancing) in an effort to limit this patient's exposure and mitigate transmission in our community. This patient is at least at moderate risk for complications without adequate follow up. This format is felt to be most appropriate for this patient at this time. Physical exam was limited by quality of the video and audio technology used for the visit. CMA was able to get the patient set up on a video visit.  Patient location: Home. Patient and provider in visit Provider location: Office  I discussed the limitations of evaluation and management by telemedicine and the availability of in person appointments. The patient expressed understanding and agreed to proceed.  Visit Date: 11/16/2021  Today's healthcare provider: Mort Sawyers, FNP     Subjective:    Patient ID: Terrence Middleton, male    DOB: 1950/06/28, 71 y.o.   MRN: [id number]  Chief Complaint  Patient presents with   Covid Positive    Tested + yesterday  fever headache sore all over    HPI  Pt here today via video visit with concerns.  Tested positive for covid yesterday. Five other family members also positive with covid.   Symptoms started yesterday 9/11, temp up to 101.40F taking tylenol 500 mg every six hours prn also alternating with ibuprofen 200 mg three hours after taking tylenol.  C/o body aches, headache, and feeling very tired. With nasal congestion.  Taking mucinex 1200 every twelve hours with mild relief.  No sob.   Has recently started the stiolto as well which has helped some.  Pt has asthma history that is exercise induced.   Past Medical History:  Diagnosis Date   ALLERGIC RHINITIS 02/14/2008   Young MD, Joni Fears D    Allergy to insect stings 02/10/2011   Hymenoptera sting    Asthma    exercise induced per patient  [place]le [place])   GERD (gastroesophageal reflux disease)    PVD (posterior vitreous detachment), left     Past Surgical History:  Procedure Laterality Date   cervical fracture  2008   fractured c3 after fall   COLONOSCOPY  05/2006   colon polyps Juanda Chance)   COLONOSCOPY  11/2013   WNL Juanda Chance)    Family History  Problem Relation Age of Onset   Asthma Mother    COPD Mother    Emphysema Father    Lymphoma Father 22   Crohn's disease Son        Colonectomy   Pulmonary disease Son 50       passed away - Hermansky-Pudlak syndrome   Colon cancer Neg Hx     Social History   Socioeconomic History   Marital status: Married    Spouse name: Not on file   Number of children: 1   Years of education: Not on file   Highest education level: Not on file  Occupational History   Occupation: Art gallery manager for [employer]  Tobacco Use   Smoking status: Never   Smokeless tobacco: Never  Vaping Use   Vaping Use: Never used  Substance and Sexual Activity   Alcohol use: Yes    Alcohol/week: 5.0 - 6.0 standard drinks of alcohol    Types: 5 - 6 Cans of beer per week    Comment: occasionally beer   Drug use: No   Sexual activity: Not on file  Other  Topics Concern   Not on file  Social History Narrative   Lives with wife, dog Manufacturing engineer).  Grown children.   33yo son [name] passed away 06/27/14 (pulm fibrosis - Hermanske-Pudlike syndrome)   Occupation: Technical sales engineer at [employer]   Activity: no regular exercise.  Does walk at work and stays active at home.   Diet: good water, fruits/vegetables daily   Social Determinants of Health   Financial Resource Strain: Low Risk  (06/30/2021)   Overall Financial Resource Strain (CARDIA)    Difficulty of Paying Living Expenses: Not hard at all  Food Insecurity: No Food Insecurity (06/30/2021)   Hunger Vital Sign    Worried About Running Out of Food in the Last Year: Never true    Ran Out of Food in the Last Year: Never true  Transportation Needs: No  Transportation Needs (06/30/2021)   PRAPARE - Administrator, Civil Service (Medical): No    Lack of Transportation (Non-Medical): No  Physical Activity: Sufficiently Active (06/30/2021)   Exercise Vital Sign    Days of Exercise per Week: 5 days    Minutes of Exercise per Session: 30 min  Stress: No Stress Concern Present (06/30/2021)   [employer] of Occupational Health - Occupational Stress Questionnaire    Feeling of Stress : Not at all  Social Connections: Moderately Integrated (06/30/2021)   Social Connection and Isolation Panel [NHANES]    Frequency of Communication with Friends and Family: More than three times a week    Frequency of Social Gatherings with Friends and Family: More than three times a week    Attends Religious Services: Never    Database administrator or Organizations: Yes    Attends Engineer, structural: More than 4 times per year    Marital Status: Married  Catering manager Violence: Not At Risk (06/30/2021)   Humiliation, Afraid, Rape, and Kick questionnaire    Fear of Current or Ex-Partner: No    Emotionally Abused: No    Physically Abused: No    Sexually Abused: No    Outpatient Medications Prior to Visit  Medication Sig Dispense Refill   albuterol (PROAIR HFA) 108 (90 Base) MCG/ACT inhaler INHALE 2 PUFFS INTO THE LUNGS EVERY 4 (FOUR) HOURS AS NEEDED FOR WHEEZING OR SHORTNESS OF BREATH. 18 g 12   EPINEPHrine 0.3 mg/0.3 mL IJ SOAJ injection Inject into thigh for severe allergic reaction 1 each 12   famotidine (PEPCID) 20 MG tablet Take 20 mg by mouth daily.     MISC NATURAL PRODUCTS PO Take 500 mg by mouth daily. Tumeric     Multiple Vitamins-Minerals (MULTIVITAMIN MEN PO) Take 1 tablet by mouth daily.     Omega-3 Fatty Acids (OMEGA 3 PO) Take 2 tablets by mouth daily.     Sodium Chloride-Sodium Bicarb 1.57 g PACK Place into the nose. Uses every morning     Tiotropium Bromide-Olodaterol (STIOLTO RESPIMAT) 2.5-2.5 MCG/ACT AERS INHALE 2  PUFFS BY MOUTH INTO THE LUNGS DAILY 4 g 12   Turmeric 500 MG CAPS Take 1 capsule by mouth daily.     vitamin B-12 (V-R VITAMIN B-12) 500 MCG tablet Take 1 tablet (500 mcg total) by mouth daily.     dicyclomine (BENTYL) 10 MG capsule TAKE 1 CAPSULE (10 MG TOTAL) BY MOUTH 2 (TWO) TIMES DAILY AS NEEDED FOR SPASMS. (Patient not taking: Reported on 11/16/2021) 180 capsule 1   No facility-administered medications prior to visit.    Allergies  Allergen Reactions   Bee Venom     Sees pulmonologist         Objective:    Physical Exam Constitutional:      General: He is not in acute distress.    Appearance: Normal appearance. He is normal weight. He is not ill-appearing, toxic-appearing or diaphoretic.  Pulmonary:     Effort: Pulmonary effort is normal.  Neurological:     General: No focal deficit present.     Mental Status: He is alert and oriented to person, place, and time. Mental status is at baseline.     Temp (!) 100.5 F (38.1 C)   Ht 5\' 9"  (1.753 m)   Wt 203 lb 4 oz (92.2 kg)   BMI 30.01 kg/m  Wt Readings from Last 3 Encounters:  11/16/21 203 lb 4 oz (92.2 kg)  08/04/21 203 lb 4 oz (92.2 kg)  06/30/21 209 lb (94.8 kg)       Assessment & Plan:   Problem List Items Addressed This Visit       Other   COVID-19 - Primary    Pt considered high risk for hospitalization. have decided pt is a candidate for antiviral and pt agrees that she would like to take this. I have sent in RX for molnupiravir 200 mg capsules to be taken as directed.  Advised of CDC guidelines for self isolation/ ending isolation.  Advised of safe practice guidelines. Symptom Tier reviewed.  Encouraged to monitor for any worsening symptoms; watch for increased shortness of breath, weakness, and signs of dehydration. Advised when to seek emergency care.  Instructed to rest and hydrate well.  Advised to leave the house during recommended isolation period, only if it is necessary to seek medical care        Relevant Medications   molnupiravir EUA (LAGEVRIO) 200 mg CAPS capsule    I have discontinued 07/02/21. Guttman's dicyclomine. I am also having him start on molnupiravir EUA. Additionally, I am having him maintain his Sodium Chloride-Sodium Bicarb, famotidine, Omega-3 Fatty Acids (OMEGA 3 PO), Multiple Vitamins-Minerals (MULTIVITAMIN MEN PO), EPINEPHrine, cyanocobalamin, MISC NATURAL PRODUCTS PO, albuterol, Stiolto Respimat, and Turmeric.  Meds ordered this encounter  Medications   molnupiravir EUA (LAGEVRIO) 200 mg CAPS capsule    Sig: Take 4 capsules (800 mg total) by mouth 2 (two) times daily for 5 days.    Dispense:  40 capsule    Refill:  0    Order Specific Question:   Supervising Provider    Answer:   BEDSOLE, AMY E [[id number]]    I discussed the assessment and treatment plan with the patient. The patient was provided an opportunity to ask questions and all were answered. The patient agreed with the plan and demonstrated an understanding of the instructions.   The patient was advised to call back or seek an in-person evaluation if the symptoms worsen or if the condition fails to improve as anticipated.  I provided 15 minutes of face-to-face time during this encounter.   Valere Dross, FNP [facility] [phone] (phone) [phone] (fax)  [facility]

## 2021-12-01 DIAGNOSIS — H43813 Vitreous degeneration, bilateral: Secondary | ICD-10-CM | POA: Diagnosis not present

## 2021-12-08 ENCOUNTER — Encounter: Payer: Self-pay | Admitting: Family Medicine

## 2021-12-10 ENCOUNTER — Ambulatory Visit (INDEPENDENT_AMBULATORY_CARE_PROVIDER_SITE_OTHER)
Admission: RE | Admit: 2021-12-10 | Discharge: 2021-12-10 | Disposition: A | Discharge status: Home / Self Care | Payer: Medicare PPO | Source: Ambulatory Visit | Attending: Family Medicine | Admitting: Family Medicine

## 2021-12-10 ENCOUNTER — Encounter: Payer: Self-pay | Admitting: Family Medicine

## 2021-12-10 ENCOUNTER — Ambulatory Visit: Payer: Medicare PPO | Admitting: Family Medicine

## 2021-12-10 VITALS — BP 122/66 | HR 76 | Temp 97.8°F | Ht 69.0 in | Wt 199.2 lb

## 2021-12-10 DIAGNOSIS — R079 Chest pain, unspecified: Secondary | ICD-10-CM | POA: Diagnosis not present

## 2021-12-10 DIAGNOSIS — U099 Post covid-19 condition, unspecified: Secondary | ICD-10-CM

## 2021-12-10 DIAGNOSIS — G9339 Other post infection and related fatigue syndromes: Secondary | ICD-10-CM

## 2021-12-10 LAB — TSH: TSH: 1.68 u[IU]/mL (ref 0.35–5.50)

## 2021-12-10 LAB — COMPREHENSIVE METABOLIC PANEL
ALT: 56 U/L — ABNORMAL HIGH (ref 0–53)
AST: 18 U/L (ref 0–37)
Albumin: 4.4 g/dL (ref 3.5–5.2)
Alkaline Phosphatase: 54 U/L (ref 39–117)
BUN: 12 mg/dL (ref 6–23)
CO2: 32 mEq/L (ref 19–32)
Calcium: 8.9 mg/dL (ref 8.4–10.5)
Chloride: 104 mEq/L (ref 96–112)
Creatinine, Ser: 1.03 mg/dL (ref 0.40–1.50)
GFR: 74.11 mL/min (ref >60.00)
Glucose, Bld: 67 mg/dL — ABNORMAL LOW (ref 70–99)
Potassium: 4 mEq/L (ref 3.5–5.1)
Sodium: 142 mEq/L (ref 135–145)
Total Bilirubin: 0.6 mg/dL (ref 0.2–1.2)
Total Protein: 6.8 g/dL (ref 6.0–8.3)

## 2021-12-10 LAB — CBC WITH DIFFERENTIAL/PLATELET
Basophils Absolute: 0 10*3/uL (ref 0.0–0.1)
Basophils Relative: 0.4 % (ref 0.0–3.0)
Eosinophils Absolute: 0.1 10*3/uL (ref 0.0–0.7)
Eosinophils Relative: 2.5 % (ref 0.0–5.0)
HCT: 43.7 % (ref 39.0–52.0)
Hemoglobin: 14.4 g/dL (ref 13.0–17.0)
Lymphocytes Relative: 22.8 % (ref 12.0–46.0)
Lymphs Abs: 1.3 10*3/uL (ref 0.7–4.0)
MCHC: 32.9 g/dL (ref 30.0–36.0)
MCV: 79.8 fl (ref 78.0–100.0)
Monocytes Absolute: 0.8 10*3/uL (ref 0.1–1.0)
Monocytes Relative: 14.1 % — ABNORMAL HIGH (ref 3.0–12.0)
Neutro Abs: 3.5 10*3/uL (ref 1.4–7.7)
Neutrophils Relative %: 60.2 % (ref 43.0–77.0)
Platelets: 245 10*3/uL (ref 150.0–400.0)
RBC: 5.48 Mil/uL (ref 4.22–5.81)
RDW: 13.7 % (ref 11.5–15.5)
WBC: 5.8 10*3/uL (ref 4.0–10.5)

## 2021-12-10 LAB — VITAMIN D 25 HYDROXY (VIT D DEFICIENCY, FRACTURES): VITD: 50.26 ng/mL (ref 30.00–100.00)

## 2021-12-10 MED ORDER — ONDANSETRON HCL 4 MG PO TABS: 4.0000 mg | ORAL_TABLET | Freq: Three times a day (TID) | ORAL | 0 refills | Status: DC | PRN

## 2021-12-10 NOTE — Assessment & Plan Note (Signed)
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 Discussed with patient. rec fluids, rest, vit C, vit D, zinc, increase pepcid to BID, ok to continue PRN imodium, zofran PRN nausea. Update if ongoing symptoms to consider stool infection evaluation.  Check CXR, labs today.

## 2021-12-10 NOTE — Progress Notes (Signed)
Patient ID: Terrence Middleton, male    DOB: 09/18/1950, 71 y.o.   MRN: [id number]  This visit was conducted in person.  BP 122/66   Pulse 76   Temp 97.8 F (36.6 C) (Axillary)   Ht 5\' 9"  (1.753 m)   Wt 199 lb 3.2 oz (90.4 kg)   SpO2 96%   BMI 29.42 kg/m    CC: COVID f/u  Subjective:   HPI: Terrence Middleton is a 71 y.o. male presenting on 12/10/2021 for Follow-up (F/U COVID tested positive 9/11. Diarrhea that started 5 days ago.) and Pain (Pain with breathing)   Seen virtually for COVID infection 11/16/2021 by 01/16/2022, treated with molnupiravir. Completed antiviral therapy. Has noted residual diarrhea, no appetite, headache, body soreness, mental fogginess, and fatigue. Left lower shoulder blade pain with deep breath, cough, mild drainage. Diarrhea started last week. Some nausea. He's been taking pepto bismol, imodium, with some temporary benefit. Following bland diet. Staying well hydrated. Also took guaifenesin while ill.  Intermittent diarrhea that is watery, only a few times a day. He feels he's staying well hydrated.   No further fever/chills, vomiting.  No recent tick bites.   Exercise induced asthma - he took stiolto respimat (tiotropium/olodaterol), not currently. No need for albuterol inh.   He continues pepcid daily and b12 daily.      Relevant past medical, surgical, family and social history reviewed and updated as indicated. Interim medical history since our last visit reviewed. Allergies and medications reviewed and updated. Outpatient Medications Prior to Visit  Medication Sig Dispense Refill   albuterol (PROAIR HFA) 108 (90 Base) MCG/ACT inhaler INHALE 2 PUFFS INTO THE LUNGS EVERY 4 (FOUR) HOURS AS NEEDED FOR WHEEZING OR SHORTNESS OF BREATH. 18 g 12   EPINEPHrine 0.3 mg/0.3 mL IJ SOAJ injection Inject into thigh for severe allergic reaction 1 each 12   famotidine (PEPCID) 20 MG tablet Take 20 mg by mouth daily.     Multiple Vitamins-Minerals (MULTIVITAMIN MEN  PO) Take 1 tablet by mouth daily.     Omega-3 Fatty Acids (OMEGA 3 PO) Take 2 tablets by mouth daily.     Sodium Chloride-Sodium Bicarb 1.57 g PACK Place into the nose. Uses every morning     Tiotropium Bromide-Olodaterol (STIOLTO RESPIMAT) 2.5-2.5 MCG/ACT AERS INHALE 2 PUFFS BY MOUTH INTO THE LUNGS DAILY 4 g 12   Turmeric 500 MG CAPS Take 1 capsule by mouth daily.     vitamin B-12 (V-R VITAMIN B-12) 500 MCG tablet Take 1 tablet (500 mcg total) by mouth daily.     MISC NATURAL PRODUCTS PO Take 500 mg by mouth daily. Tumeric     No facility-administered medications prior to visit.     Per HPI unless specifically indicated in ROS section below Review of Systems  Objective:  BP 122/66   Pulse 76   Temp 97.8 F (36.6 C) (Axillary)   Ht 5\' 9"  (1.753 m)   Wt 199 lb 3.2 oz (90.4 kg)   SpO2 96%   BMI 29.42 kg/m   Wt Readings from Last 3 Encounters:  12/10/21 199 lb 3.2 oz (90.4 kg)  11/16/21 203 lb 4 oz (92.2 kg)  08/04/21 203 lb 4 oz (92.2 kg)      Physical Exam Vitals and nursing note reviewed.  Constitutional:      Appearance: Normal appearance. He is not ill-appearing.  HENT:     Head: Normocephalic and atraumatic.     Right Ear: Hearing, tympanic membrane, ear  canal and external ear normal. There is no impacted cerumen.     Left Ear: Hearing, tympanic membrane, ear canal and external ear normal. There is no impacted cerumen.     Nose: No mucosal edema.     Right Turbinates: Not enlarged or swollen.     Left Turbinates: Not enlarged or swollen.     Right Sinus: No maxillary sinus tenderness or frontal sinus tenderness.     Left Sinus: No maxillary sinus tenderness or frontal sinus tenderness.     Mouth/Throat:     Mouth: Mucous membranes are moist.     Pharynx: Oropharynx is clear. No oropharyngeal exudate or posterior oropharyngeal erythema.  Eyes:     Extraocular Movements: Extraocular movements intact.     Conjunctiva/sclera: Conjunctivae normal.     Pupils: Pupils are  equal, round, and reactive to light.  Cardiovascular:     Rate and Rhythm: Normal rate and regular rhythm.     Pulses: Normal pulses.     Heart sounds: Normal heart sounds. No murmur heard. Pulmonary:     Effort: Pulmonary effort is normal. No respiratory distress.     Breath sounds: Normal breath sounds. No wheezing, rhonchi or rales.  Abdominal:     General: Abdomen is flat. Bowel sounds are normal. There is no distension.     Palpations: Abdomen is soft. There is no mass.     Tenderness: There is no abdominal tenderness. There is no guarding or rebound.     Hernia: No hernia is present.  Musculoskeletal:     Cervical back: Normal range of motion and neck supple. No rigidity.     Right lower leg: No edema.     Left lower leg: No edema.  Lymphadenopathy:     Cervical: No cervical adenopathy.  Skin:    General: Skin is warm and dry.     Findings: No rash.  Neurological:     Mental Status: He is alert.  Psychiatric:        Mood and Affect: Mood normal.        Behavior: Behavior normal.       Results for orders placed or performed in visit on 07/28/21  Vitamin B12  Result Value Ref Range   Vitamin B-12 692 211 - 911 pg/mL  PSA  Result Value Ref Range   PSA 1.28 0.10 - 4.00 ng/mL  Comprehensive metabolic panel  Result Value Ref Range   Sodium 141 135 - 145 mEq/L   Potassium 5.1 3.5 - 5.1 mEq/L   Chloride 104 96 - 112 mEq/L   CO2 28 19 - 32 mEq/L   Glucose, Bld 105 (H) 70 - 99 mg/dL   BUN 16 6 - 23 mg/dL   Creatinine, Ser 0.98 0.40 - 1.50 mg/dL   Total Bilirubin 0.5 0.2 - 1.2 mg/dL   Alkaline Phosphatase 45 39 - 117 U/L   AST 12 0 - 37 U/L   ALT 15 0 - 53 U/L   Total Protein 6.8 6.0 - 8.3 g/dL   Albumin 4.4 3.5 - 5.2 g/dL   GFR 78.88 >60.00 mL/min   Calcium 9.2 8.4 - 10.5 mg/dL  Lipid panel  Result Value Ref Range   Cholesterol 151 0 - 200 mg/dL   Triglycerides 73.0 0.0 - 149.0 mg/dL   HDL 37.10 (L) >39.00 mg/dL   VLDL 14.6 0.0 - 40.0 mg/dL   LDL Cholesterol  100 (H) 0 - 99 mg/dL   Total CHOL/HDL Ratio 4  NonHDL 114.14     Assessment & Plan:   Problem List Items Addressed This Visit     Post-COVID syndrome - Primary    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 Discussed with patient. rec fluids, rest, vit C, vit D, zinc, increase pepcid to BID, ok to continue PRN imodium, zofran PRN nausea. Update if ongoing symptoms to consider stool infection evaluation.  Check CXR, labs today.      Relevant Orders   DG Chest 2 View   VITAMIN D 25 Hydroxy (Vit-D Deficiency, Fractures)   Comprehensive metabolic panel   TSH   CBC with Differential/Platelet   Other Visit Diagnoses     Other post infection and related fatigue syndromes       Relevant Orders   VITAMIN D 25 Hydroxy (Vit-D Deficiency, Fractures)   Comprehensive metabolic panel   TSH   CBC with Differential/Platelet        Meds ordered this encounter  Medications   ondansetron (ZOFRAN) 4 MG tablet    Sig: Take 1 tablet (4 mg total) by mouth every 8 (eight) hours as needed for nausea or vomiting.    Dispense:  20 tablet    Refill:  0   Orders Placed This Encounter  Procedures   DG Chest 2 View    Standing Status:   Future    Number of Occurrences:   1    Standing Expiration Date:   12/11/2022    Order Specific Question:   Reason for Exam (SYMPTOM  OR DIAGNOSIS REQUIRED)    Answer:   post- covid left back pain    Order Specific Question:   Preferred imaging location?    Answer:   [facility]   VITAMIN D 25 Hydroxy (Vit-D Deficiency, Fractures)   Comprehensive metabolic panel   TSH   CBC with Differential/Platelet     Patient Instructions  Chest xray today.  Continue vitamin B12 daily, add vitamin D 1000 units, vitamin C 500mg , zinc 50-100mg  daily.  Increase pepcid to twice daily.  May use imodium as needed.  This sounds like post- COVID syndrome or "long hauler covid" - should improve with time and supportive care. Lots of fluids, lots of  rest.  Let know if not improving with treatment, if ongoing diarrhea, or if any worsening symptoms.   Follow up plan: Return if symptoms worsen or fail to improve.  [place], MD

## 2021-12-10 NOTE — Patient Instructions (Addendum)
Chest xray today.  Continue vitamin B12 daily, add vitamin D 1000 units, vitamin C 500mg , zinc 50-100mg  daily.  Increase pepcid to twice daily.  May use imodium as needed.  This sounds like post- COVID syndrome or "long hauler covid" - should improve with time and supportive care. Lots of fluids, lots of rest.  Let us know if not improving with treatment, if ongoing diarrhea, or if any worsening symptoms.

## 2021-12-13 ENCOUNTER — Encounter: Payer: Self-pay | Admitting: Family Medicine

## 2021-12-31 ENCOUNTER — Encounter: Payer: Self-pay | Admitting: Family Medicine

## 2021-12-31 ENCOUNTER — Ambulatory Visit: Payer: Medicare PPO | Admitting: Family Medicine

## 2021-12-31 VITALS — BP 120/82 | HR 88 | Temp 97.2°F | Ht 69.0 in | Wt 195.4 lb

## 2021-12-31 DIAGNOSIS — R1013 Epigastric pain: Secondary | ICD-10-CM

## 2021-12-31 DIAGNOSIS — K219 Gastro-esophageal reflux disease without esophagitis: Secondary | ICD-10-CM

## 2021-12-31 DIAGNOSIS — U099 Post covid-19 condition, unspecified: Secondary | ICD-10-CM

## 2021-12-31 MED ORDER — OMEPRAZOLE 40 MG PO CPDR: 40.0000 mg | DELAYED_RELEASE_CAPSULE | Freq: Every day | ORAL | 1 refills | Status: DC

## 2021-12-31 NOTE — Progress Notes (Unsigned)
Patient ID: Terrence Middleton, male    DOB: 31-Oct-1950, 71 y.o.   MRN: [id number]  This visit was conducted in person.  BP 120/82   Pulse 88   Temp (!) 97.2 F (36.2 C) (Temporal)   Ht 5\' 9"  (1.753 m)   Wt 195 lb 6 oz (88.6 kg)   SpO2 96%   BMI 28.85 kg/m    CC: GI issues  Subjective:   HPI: Terrence Middleton is a 71 y.o. male presenting on 12/31/2021 for GI Problem (C/o ongoing abd pain, abd gurgling/churning and gas. Sxs have improved somewhat.  States sxs wake him during the night. States he sent a lengthy MyChart msg but didn't hear anything back.  Also, pt wants to discuss getting flu and Covid shots. )   See prior note for details.  Diagnosed COVID 11/15/2021.  Seen earlier this month with ongoing symptoms (residual diarrhea, no appetite, headache, body soreness, mental fogginess, and fatigue) - concern for developing post-acute COVID sequelae.   In the past 3 weeks, symptoms continue to improve but notes ongoing GI symptoms of churning feeling in stomach, with gurgling/gassiness, upper abdominal pain that can wake him up from sleep. Describes dull upper abdominal pain as well as reflux symptoms.   Continues benadryl at night, pepcid 20mg  daily and vitamin B12 daily, as well as added vitamin C 1000mg , D3 1000 IU, zinc 100mg . Also increased yogurt, started simethicone 2-3 times a day, and most recently started [facility] probiotic. Feels daily pepcid is controlling reflux symptoms.   Diarrhea has resolved - currently having 1-2 formed stools daily.  Notes mental fogginess is slowly improving.  He's been eating more regularly but weight is still mildly down.      Relevant past medical, surgical, family and social history reviewed and updated as indicated. Interim medical history since our last visit reviewed. Allergies and medications reviewed and updated. Outpatient Medications Prior to Visit  Medication Sig Dispense Refill   albuterol (PROAIR HFA) 108 (90 Base) MCG/ACT  inhaler INHALE 2 PUFFS INTO THE LUNGS EVERY 4 (FOUR) HOURS AS NEEDED FOR WHEEZING OR SHORTNESS OF BREATH. 18 g 12   Cholecalciferol (VITAMIN D3) 25 MCG (1000 UT) CAPS Take 1 capsule by mouth daily.     EPINEPHrine 0.3 mg/0.3 mL IJ SOAJ injection Inject into thigh for severe allergic reaction 1 each 12   famotidine (PEPCID) 20 MG tablet Take 20 mg by mouth daily.     Sodium Chloride-Sodium Bicarb 1.57 g PACK Place into the nose. Uses every morning     Tiotropium Bromide-Olodaterol (STIOLTO RESPIMAT) 2.5-2.5 MCG/ACT AERS INHALE 2 PUFFS BY MOUTH INTO THE LUNGS DAILY 4 g 12   vitamin B-12 (V-R VITAMIN B-12) 500 MCG tablet Take 1 tablet (500 mcg total) by mouth daily.     Zinc 100 MG TABS Take 1 tablet by mouth daily.     Ascorbic Acid (VITAMIN C) 1000 MG tablet Take 1,000 mg by mouth daily.     Turmeric 500 MG CAPS Take 1 capsule by mouth daily.     Multiple Vitamins-Minerals (MULTIVITAMIN MEN PO) Take 1 tablet by mouth daily.     Omega-3 Fatty Acids (OMEGA 3 PO) Take 2 tablets by mouth daily.     ondansetron (ZOFRAN) 4 MG tablet Take 1 tablet (4 mg total) by mouth every 8 (eight) hours as needed for nausea or vomiting. 20 tablet 0   No facility-administered medications prior to visit.     Per HPI unless specifically  indicated in ROS section below Review of Systems  Objective:  BP 120/82   Pulse 88   Temp (!) 97.2 F (36.2 C) (Temporal)   Ht 5\' 9"  (1.753 m)   Wt 195 lb 6 oz (88.6 kg)   SpO2 96%   BMI 28.85 kg/m   Wt Readings from Last 3 Encounters:  12/31/21 195 lb 6 oz (88.6 kg)  12/10/21 199 lb 3.2 oz (90.4 kg)  11/16/21 203 lb 4 oz (92.2 kg)      Physical Exam Vitals and nursing note reviewed.  Constitutional:      Appearance: Normal appearance. He is not ill-appearing.  HENT:     Head: Normocephalic and atraumatic.     Mouth/Throat:     Comments: Wearing mask Eyes:     Extraocular Movements: Extraocular movements intact.     Pupils: Pupils are equal, round, and reactive  to light.  Cardiovascular:     Rate and Rhythm: Normal rate and regular rhythm.     Pulses: Normal pulses.     Heart sounds: Normal heart sounds. No murmur heard. Pulmonary:     Effort: Pulmonary effort is normal. No respiratory distress.     Breath sounds: Normal breath sounds. No wheezing, rhonchi or rales.  Abdominal:     General: Bowel sounds are normal. There is no distension.     Palpations: Abdomen is soft. There is no mass.     Tenderness: There is abdominal tenderness (mild) in the epigastric area. There is no right CVA tenderness, left CVA tenderness, guarding or rebound. Negative signs include Murphy's sign.     Hernia: No hernia is present.  Musculoskeletal:     Right lower leg: No edema.     Left lower leg: No edema.  Skin:    General: Skin is warm and dry.     Findings: No rash.  Neurological:     Mental Status: He is alert.  Psychiatric:        Mood and Affect: Mood normal.        Behavior: Behavior normal.       Results for orders placed or performed in visit on 12/10/21  VITAMIN D 25 Hydroxy (Vit-D Deficiency, Fractures)  Result Value Ref Range   VITD 50.26 30.00 - 100.00 ng/mL  Comprehensive metabolic panel  Result Value Ref Range   Sodium 142 135 - 145 mEq/L   Potassium 4.0 3.5 - 5.1 mEq/L   Chloride 104 96 - 112 mEq/L   CO2 32 19 - 32 mEq/L   Glucose, Bld 67 (L) 70 - 99 mg/dL   BUN 12 6 - 23 mg/dL   Creatinine, Ser 1.03 0.40 - 1.50 mg/dL   Total Bilirubin 0.6 0.2 - 1.2 mg/dL   Alkaline Phosphatase 54 39 - 117 U/L   AST 18 0 - 37 U/L   ALT 56 (H) 0 - 53 U/L   Total Protein 6.8 6.0 - 8.3 g/dL   Albumin 4.4 3.5 - 5.2 g/dL   GFR 74.11 >60.00 mL/min   Calcium 8.9 8.4 - 10.5 mg/dL  TSH  Result Value Ref Range   TSH 1.68 0.35 - 5.50 uIU/mL  CBC with Differential/Platelet  Result Value Ref Range   WBC 5.8 4.0 - 10.5 K/uL   RBC 5.48 4.22 - 5.81 Mil/uL   Hemoglobin 14.4 13.0 - 17.0 g/dL   HCT 43.7 39.0 - 52.0 %   MCV 79.8 78.0 - 100.0 fl   MCHC 32.9  30.0 - 36.0 g/dL  RDW 13.7 11.5 - 15.5 %   Platelets 245.0 150.0 - 400.0 K/uL   Neutrophils Relative % 60.2 43.0 - 77.0 %   Lymphocytes Relative 22.8 12.0 - 46.0 %   Monocytes Relative 14.1 (H) 3.0 - 12.0 %   Eosinophils Relative 2.5 0.0 - 5.0 %   Basophils Relative 0.4 0.0 - 3.0 %   Neutro Abs 3.5 1.4 - 7.7 K/uL   Lymphs Abs 1.3 0.7 - 4.0 K/uL   Monocytes Absolute 0.8 0.1 - 1.0 K/uL   Eosinophils Absolute 0.1 0.0 - 0.7 K/uL   Basophils Absolute 0.0 0.0 - 0.1 K/uL    Assessment & Plan:   Problem List Items Addressed This Visit     GERD    Overall stable period on pepcid however with noted epigastric discomfort/reflux symptoms, will intensify treatment with omeprazole 40mg  daily x 3 wks then PRN, then may return to daily pepcid.       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 He is in 1-3 month time frame.  Anticipate ongoing gut inflammation post COVID causing current symptoms.  Reviewed previously reassuring blood work.  Diarrhea has since resolved - no need for stool studies.  Discussed limiting gas producing foods, trial of lactose-free diet, keeping food diary to find possible food triggers for symptoms.  Hold vit C and turmeric for now, in interim may continue probiotic, benadryl, other vitamins, simethicone.  Update with effect in 2-3 weeks.       Epigastric discomfort    Trial omeprazole course Continue probiotic        Meds ordered this encounter  Medications   omeprazole (PRILOSEC) 40 MG capsule    Sig: Take 1 capsule (40 mg total) by mouth daily. For 3 weeks daily then as needed    Dispense:  30 capsule    Refill:  1   No orders of the defined types were placed in this encounter.   Patient Instructions  Stop vitamin C and turmeric.  Continue probiotic and pepcid 20mg  daily.  If worsening reflux symptoms,  could consider stronger GERD medicine (omeprazole/prilosec).  Keep food diary to see if we can find any triggers to abdominal discomfort.  Exclude gas producing foods (beans, onions, celery, carrots, raisins, bananas, apricots, prunes, brussel sprouts, wheat germ, pretzels).  Consider trail of lactose free diet (back off milk).   Follow up plan: Return if symptoms worsen or fail to improve.  , MD

## 2021-12-31 NOTE — Patient Instructions (Addendum)
Stop vitamin C and turmeric.  Continue probiotic and pepcid 20mg  daily.  If worsening reflux symptoms, could consider stronger GERD medicine (omeprazole/prilosec).  Keep food diary to see if we can find any triggers to abdominal discomfort.  Exclude gas producing foods (beans, onions, celery, carrots, raisins, bananas, apricots, prunes, brussel sprouts, wheat germ, pretzels).  Consider trail of lactose free diet (back off milk).

## 2022-01-01 DIAGNOSIS — R1013 Epigastric pain: Secondary | ICD-10-CM | POA: Insufficient documentation

## 2022-01-01 NOTE — Assessment & Plan Note (Addendum)
Reviewed criteria for COVID-long hauler which includes symptoms past 3 months. He is in 1-3 month time frame.  Anticipate ongoing gut inflammation post COVID causing current symptoms.  Reviewed previously reassuring blood work.  Diarrhea has since resolved - no need for stool studies.  Discussed limiting gas producing foods, trial of lactose-free diet, keeping food diary to find possible food triggers for symptoms.  Hold vit C and turmeric for now, in interim may continue probiotic, benadryl, other vitamins, simethicone.  Update with effect in 2-3 weeks.

## 2022-01-01 NOTE — Assessment & Plan Note (Signed)
Overall stable period on pepcid however with noted epigastric discomfort/reflux symptoms, will intensify treatment with omeprazole 40mg  daily x 3 wks then PRN, then may return to daily pepcid.

## 2022-01-01 NOTE — Assessment & Plan Note (Signed)
Trial omeprazole course Continue probiotic

## 2022-01-01 NOTE — Telephone Encounter (Signed)
Seen in office yesterday

## 2022-01-22 ENCOUNTER — Other Ambulatory Visit: Payer: Self-pay | Admitting: Family Medicine

## 2022-02-03 ENCOUNTER — Ambulatory Visit: Payer: Medicare PPO | Admitting: Nurse Practitioner

## 2022-02-03 ENCOUNTER — Encounter: Payer: Self-pay | Admitting: Nurse Practitioner

## 2022-02-03 ENCOUNTER — Ambulatory Visit (INDEPENDENT_AMBULATORY_CARE_PROVIDER_SITE_OTHER): Payer: Medicare PPO

## 2022-02-03 ENCOUNTER — Other Ambulatory Visit: Payer: Self-pay | Admitting: Internal Medicine

## 2022-02-03 VITALS — BP 136/82 | HR 83 | Ht 70.0 in | Wt 196.3 lb

## 2022-02-03 DIAGNOSIS — J209 Acute bronchitis, unspecified: Secondary | ICD-10-CM | POA: Diagnosis not present

## 2022-02-03 DIAGNOSIS — J069 Acute upper respiratory infection, unspecified: Secondary | ICD-10-CM

## 2022-02-03 DIAGNOSIS — J4521 Mild intermittent asthma with (acute) exacerbation: Secondary | ICD-10-CM | POA: Diagnosis not present

## 2022-02-03 DIAGNOSIS — R059 Cough, unspecified: Secondary | ICD-10-CM | POA: Diagnosis not present

## 2022-02-03 MED ORDER — PREDNISONE 20 MG PO TABS: 40.0000 mg | ORAL_TABLET | Freq: Every day | ORAL | 0 refills | Status: AC

## 2022-02-03 MED ORDER — BENZONATATE 200 MG PO CAPS: 200.0000 mg | ORAL_CAPSULE | Freq: Three times a day (TID) | ORAL | 1 refills | Status: DC | PRN

## 2022-02-03 NOTE — Telephone Encounter (Signed)
-   Albuterol refilled.

## 2022-02-03 NOTE — Assessment & Plan Note (Signed)
Viral URI with acute bronchitis. Flu negative today. Recent COVID infection 9/11 so deferred testing today given he is in the 90 day window. Advised we treat him with prednisone burst and supportive care measures. CXR to rule out superimposed infection as cause of fevers/cough, especially given his previous COVID infection.   Patient Instructions  Continue Albuterol inhaler 2 puffs every 6 hours as needed for shortness of breath or wheezing. Notify if symptoms persist despite rescue inhaler/neb use.  Continue stiolto 2 puffs daily  Continue guaifenesin 1200 mg Twice daily for chest congestion  Benzonatate 1 capsule Three times a day for cough Prednisone 40 mg daily for 5 days. Take in AM with food Saline nasal irrigation 1-2 times a day Chloraseptic spray or lozenges for sore throat Use over the counter tylenol or ibuprofen/Advil for fevers/pain  Chest x ray today  Follow up in 2 weeks with Dr. Maple Hudson or Philis Nettle. If symptoms do not improve or worsen, please contact office for sooner follow up or seek emergency care.

## 2022-02-03 NOTE — Progress Notes (Signed)
@Patient  ID: , male    DOB: 01-26-1951, 71 y.o.   MRN: [id number]  Chief Complaint  Patient presents with   Follow-up    Pt acute visit symps starting Sunday (fever, heavy congestion, cough, sore throat). Pt reports having COVID 9/11, but per his home test this morning he was negative.     Referring provider: 11/11, MD  HPI: 71 year old male, never smoker followed for asthma, allergic rhinitis. He is a patient of Dr. 78 and last seen in office 03/16/2021. Past medical history significant for GERD, BPH, HLD.   TEST/EVENTS:   03/16/2021: OV with Dr. 05/14/2021. Treated December for asthma bronchitis with doxycycline and prednisone taper. Doing well today. Continued on prn stiolto and prn SABA. Exacerbations usually occur with viral infections. 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   02/03/2022: Today - acute Patient presents today for acute visit. He and his wife had COVID 9/11. Treated with antiviral. He struggled with long term symptoms, mostly GI and cough. These finally resolved a few weeks ago. Then on Sunday, he developed febrile illness with cough, chest congestion, and sore throat. His wife also is sick with similar symptoms. He did COVID test this morning, which was negative. He's started noticing some increased nasal drainage as well, mostly postnasal drip. Sore throat has improved some. No fevers today. T max was 100.7. cough is primarily dry. He did have some wheezing last night. He denies any increased shortness of breath, headaches, anorexia, nausea/vomiting. He is eating and drinking well.   Allergies  Allergen Reactions   Bee Venom     Sees pulmonologist    Immunization History  Administered Date(s) Administered   Fluad Quad(high Dose 65+) 01/22/2019   Influenza Split 02/11/2011, 02/16/2012   Influenza, High Dose Seasonal PF 12/12/2019, 12/22/2020   Influenza,inj,Quad PF,6+ Mos 11/12/2012, 11/20/2013, 11/24/2014, 11/27/2015,  12/27/2016, 01/19/2018   PFIZER(Purple Top)SARS-COV-2 Vaccination 04/13/2019, 05/04/2019, 12/12/2019, 10/23/2020   Pfizer Covid-19 Vaccine Bivalent Booster 6yrs & up 03/19/2021   Pneumococcal Conjugate-13 02/22/2016   Pneumococcal Polysaccharide-23 02/19/2015   Td 01/04/2006   Tdap 11/27/2015, 06/27/2019   Zoster Recombinat (Shingrix) 03/04/2019, 10/03/2019   Zoster, Live 11/12/2012    Past Medical History:  Diagnosis Date   ALLERGIC RHINITIS 02/14/2008   14/12/2007 MD, Maple Hudson D    Allergy to insect stings 02/10/2011   Hymenoptera sting    Asthma    exercise induced per patient 14/08/2010)   GERD (gastroesophageal reflux disease)    PVD (posterior vitreous detachment), left     Tobacco History: Social History   Tobacco Use  Smoking Status Never  Smokeless Tobacco Never   Counseling given: Not Answered   Outpatient Medications Prior to Visit  Medication Sig Dispense Refill   Cholecalciferol (VITAMIN D3) 25 MCG (1000 UT) CAPS Take 1 capsule by mouth daily.     EPINEPHrine 0.3 mg/0.3 mL IJ SOAJ injection Inject into thigh for severe allergic reaction 1 each 12   famotidine (PEPCID) 20 MG tablet Take 20 mg by mouth daily.     guaiFENesin (MUCINEX) 600 MG 12 hr tablet Take 1,200 mg by mouth.     omeprazole (PRILOSEC) 40 MG capsule TAKE 1 CAPSULE (40 MG TOTAL) BY MOUTH DAILY. FOR 3 WEEKS DAILY THEN AS NEEDED 90 capsule 1   Sodium Chloride-Sodium Bicarb 1.57 g PACK Place into the nose. Uses every morning     Tiotropium Bromide-Olodaterol (STIOLTO RESPIMAT) 2.5-2.5 MCG/ACT AERS INHALE 2 PUFFS BY MOUTH INTO THE LUNGS  DAILY 4 g 12   vitamin B-12 (V-R VITAMIN B-12) 500 MCG tablet Take 1 tablet (500 mcg total) by mouth daily.     Zinc 100 MG TABS Take 1 tablet by mouth daily.     albuterol (PROAIR HFA) 108 (90 Base) MCG/ACT inhaler INHALE 2 PUFFS INTO THE LUNGS EVERY 4 (FOUR) HOURS AS NEEDED FOR WHEEZING OR SHORTNESS OF BREATH. 18 g 12   No facility-administered medications prior to visit.      Review of Systems:   Constitutional: No weight loss or gain, night sweats, fevers, chills, fatigue, or lassitude. HEENT: No headaches, difficulty swallowing, tooth/dental problems. No sneezing, itching, ear ache. +sore throat, nasal congestion, postnasal drip CV:  No chest pain, orthopnea, PND, swelling in lower extremities, anasarca, dizziness, palpitations, syncope Resp: +dry cough; chest congestion; wheezing. No shortness of breath with exertion or at rest. No excess mucus or change in color of mucus. No hemoptysis. No chest wall deformity GI:  No heartburn, indigestion, abdominal pain, nausea, vomiting, diarrhea, change in bowel habits, loss of appetite, bloody stools.  GU: No dysuria, change in color of urine, urgency or frequency.  Skin: No rash, lesions, ulcerations MSK:  No joint pain or swelling.   Neuro: No dizziness or lightheadedness.  Psych: No depression or anxiety. Mood stable.     Physical Exam:  BP 136/82   Pulse 83   Ht 5\' 10"  (1.778 m)   Wt 196 lb 4.8 oz (89 kg)   SpO2 99%   BMI 28.17 kg/m   GEN: Pleasant, interactive, well-appearing; in no acute distress. HEENT:  Normocephalic and atraumatic. EACs patent bilaterally. TM pearly gray with present light reflex bilaterally. PERRLA. Sclera white. Nasal turbinates pink, moist and patent bilaterally. Clear rhinorrhea present. Oropharynx erythematous and moist, without exudate or edema. No lesions, ulcerations NECK:  Supple w/ fair ROM. No JVD present. Normal carotid impulses w/o bruits. Thyroid symmetrical with no goiter or nodules palpated. No lymphadenopathy.   CV: RRR, no m/r/g, no peripheral edema. Pulses intact, +2 bilaterally. No cyanosis, pallor or clubbing. PULMONARY:  Unlabored, regular breathing. Mild scattered rhonchi bilaterally A&P. Bronchitic cough. No accessory muscle use.  GI: BS present and normoactive. Soft, non-tender to palpation. No organomegaly or masses detected.  MSK: No erythema, warmth or  tenderness. Cap refil <2 sec all extrem. No deformities or joint swelling noted.  Neuro: A/Ox3. No focal deficits noted.   Skin: Warm, no lesions or rashe Psych: Normal affect and behavior. Judgement and thought content appropriate.     Lab Results:  CBC    Component Value Date/Time   WBC 5.8 12/10/2021 0853   RBC 5.48 12/10/2021 0853   HGB 14.4 12/10/2021 0853   HCT 43.7 12/10/2021 0853   PLT 245.0 12/10/2021 0853   MCV 79.8 12/10/2021 0853   MCHC 32.9 12/10/2021 0853   RDW 13.7 12/10/2021 0853   LYMPHSABS 1.3 12/10/2021 0853   MONOABS 0.8 12/10/2021 0853   EOSABS 0.1 12/10/2021 0853   BASOSABS 0.0 12/10/2021 0853    BMET    Component Value Date/Time   NA 142 12/10/2021 0853   K 4.0 12/10/2021 0853   CL 104 12/10/2021 0853   CO2 32 12/10/2021 0853   GLUCOSE 67 (L) 12/10/2021 0853   BUN 12 12/10/2021 0853   CREATININE 1.03 12/10/2021 0853   CALCIUM 8.9 12/10/2021 0853   GFRNONAA 83.67 11/03/2009 0916   GFRAA 152 05/04/2006 1553    BNP No results found for: "BNP"   Imaging:  DG  Chest 2 View  Result Date: 02/03/2022 CLINICAL DATA:  Febrile illness.  Cough. EXAM: CHEST - 2 VIEW COMPARISON:  Chest two views 12/10/2021, 10/13/2014, 08/03/2011 FINDINGS: Cardiac silhouette and mediastinal contours are within normal limits. Mild calcification within the aortic arch. Mild curvilinear left costophrenic angle scarring is unchanged from multiple prior radiographs including 08/03/2011. The lungs are otherwise clear. No pleural effusion or pneumothorax. Moderate multilevel degenerative disc changes of the thoracic spine. IMPRESSION: 1. No active cardiopulmonary disease. 2. Mild left costophrenic angle scarring, unchanged from multiple prior radiographs. Electronically Signed   By: Yvonne Kendall M.D.   On: 02/03/2022 14:03         Latest Ref Rng & Units 03/04/2016    9:56 AM  PFT Results  FVC-Pre L 4.58   FVC-Predicted Pre % 100   FVC-Post L 4.64   FVC-Predicted Post %  101   Pre FEV1/FVC % % 69   Post FEV1/FCV % % 66   FEV1-Pre L 3.15   FEV1-Predicted Pre % 92   FEV1-Post L 3.07   DLCO uncorrected ml/min/mmHg 29.28   DLCO UNC% % 92   DLVA Predicted % 89   TLC L 6.69   TLC % Predicted % 96   RV % Predicted % 79     No results found for: "NITRICOXIDE"      Assessment & Plan:   URI (upper respiratory infection) Viral URI with acute bronchitis. Flu negative today. Recent COVID infection 9/11 so deferred testing today given he is in the 90 day window. Advised we treat him with prednisone burst and supportive care measures. CXR to rule out superimposed infection as cause of fevers/cough, especially given his previous COVID infection.   Patient Instructions  Continue Albuterol inhaler 2 puffs every 6 hours as needed for shortness of breath or wheezing. Notify if symptoms persist despite rescue inhaler/neb use.  Continue stiolto 2 puffs daily  Continue guaifenesin 1200 mg Twice daily for chest congestion  Benzonatate 1 capsule Three times a day for cough Prednisone 40 mg daily for 5 days. Take in AM with food Saline nasal irrigation 1-2 times a day Chloraseptic spray or lozenges for sore throat Use over the counter tylenol or ibuprofen/Advil for fevers/pain  Chest x ray today  Follow up in 2 weeks with Dr. Annamaria Boots or Alanson Aly. If symptoms do not improve or worsen, please contact office for sooner follow up or seek emergency care.    Mild intermittent acute asthmatic bronchitis Acute bronchitis related to URI. See above plan. He will continue prn Stiolto and SABA. Asthma action plan in place.    I spent 35 minutes of dedicated to the care of this patient on the date of this encounter to include pre-visit review of records, face-to-face time with the patient discussing conditions above, post visit ordering of testing, clinical documentation with the electronic health record, making appropriate referrals as documented, and communicating  necessary findings to members of the patients care team.  Clayton Bibles, NP 02/03/2022  Pt aware and understands NP's role.

## 2022-02-03 NOTE — Patient Instructions (Addendum)
Continue Albuterol inhaler 2 puffs every 6 hours as needed for shortness of breath or wheezing. Notify if symptoms persist despite rescue inhaler/neb use.  Continue stiolto 2 puffs daily  Continue guaifenesin 1200 mg Twice daily for chest congestion  Benzonatate 1 capsule Three times a day for cough Prednisone 40 mg daily for 5 days. Take in AM with food Saline nasal irrigation 1-2 times a day Chloraseptic spray or lozenges for sore throat Use over the counter tylenol or ibuprofen/Advil for fevers/pain  Chest x ray today  Follow up in 2 weeks with Dr. Maple Hudson or Philis Nettle. If symptoms do not improve or worsen, please contact office for sooner follow up or seek emergency care.

## 2022-02-03 NOTE — Assessment & Plan Note (Signed)
Acute bronchitis related to URI. See above plan. He will continue prn Stiolto and SABA. Asthma action plan in place.

## 2022-02-11 ENCOUNTER — Telehealth: Payer: Self-pay | Admitting: Internal Medicine

## 2022-02-11 MED ORDER — DOXYCYCLINE HYCLATE 100 MG PO TABS: 100.0000 mg | ORAL_TABLET | Freq: Two times a day (BID) | ORAL | 0 refills | Status: DC

## 2022-02-11 MED ORDER — AMOXICILLIN-POT CLAVULANATE 875-125 MG PO TABS: 1.0000 | ORAL_TABLET | Freq: Two times a day (BID) | ORAL | 0 refills | Status: DC

## 2022-02-11 NOTE — Telephone Encounter (Signed)
Please send doxycycline 1 tab Twice daily for 7 days. Take with food. Needs to wear sunscreen when outside as medication increases risk for sunburns. Recommend he continue the mucinex and his stiolto. If symptoms become severe or breathing worsens, go to ED. Ensure he has follow up.

## 2022-02-11 NOTE — Telephone Encounter (Signed)
Spoke with the pt  He is c/o increased cough over the past few days  Feels like it has settled into his chest and he has been coughing up green sputum throughout the day  He woke up 2 night ago with soaking sweats  Denies fever  He is not sure if the pred 40 mg x 5 days helped him- given at on with Katie on 02/03/22  He is taking stiolto  Please advise thanks!  Allergies  Allergen Reactions   Bee Venom     Sees pulmonologist

## 2022-02-11 NOTE — Telephone Encounter (Signed)
Please send augmentin 875 mg, # 14, 1 twice daily 

## 2022-02-11 NOTE — Telephone Encounter (Signed)
Spoke with the pt and notified of response per Dr Maple Hudson. He verbalized understanding. Will call back if not improving. Rx sent. Nothing further needed.

## 2022-02-11 NOTE — Telephone Encounter (Signed)
Mychart message sent by pt: Terrence Middleton [facility] (supporting Micheline Maze V, NP)6 hours ago (7:39 AM)    I saw you on 11/30 for the following symptoms.   Chest congestion with drainage, very sore throat, fevers at night, very tired, some wheezing, started 01/31/22.   Had Covid under care of primary care physician, Dr. Iva Lento at [facility], starting Sept. 11th which became "Long Covid" per Dr. Reece Agar. Most symptoms of it are better after about 9 weeks.  With history of asthma, do not want to  risk this becoming lung infection.  Using Stiolito Respimat  prescribed by Dr. Maple Hudson previously. You gave me a 5 day round of Prednisone Benzonatate for the cough as well as a chest xray.   I am now having much bronchial congestion, coughing up greenish yellow in night and morning but also during the day.  Lot of drainage.  Last night sweat was 3 nights ago. No sore throat, not using Benzonatate.  Taking Mucinex 12 hour/1200mg  twice a day along with the Stiolito. I may need an antibotic to take care of this if it is bacterial.  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  Can you call in an antibotic?   Katie, please advise.

## 2022-02-17 ENCOUNTER — Ambulatory Visit: Payer: Medicare PPO | Admitting: Primary Care

## 2022-02-24 DIAGNOSIS — Z85828 Personal history of other malignant neoplasm of skin: Secondary | ICD-10-CM | POA: Diagnosis not present

## 2022-02-24 DIAGNOSIS — L821 Other seborrheic keratosis: Secondary | ICD-10-CM | POA: Diagnosis not present

## 2022-02-24 DIAGNOSIS — D225 Melanocytic nevi of trunk: Secondary | ICD-10-CM | POA: Diagnosis not present

## 2022-02-24 DIAGNOSIS — L812 Freckles: Secondary | ICD-10-CM | POA: Diagnosis not present

## 2022-02-24 DIAGNOSIS — D2271 Melanocytic nevi of right lower limb, including hip: Secondary | ICD-10-CM | POA: Diagnosis not present

## 2022-02-24 DIAGNOSIS — D2272 Melanocytic nevi of left lower limb, including hip: Secondary | ICD-10-CM | POA: Diagnosis not present

## 2022-02-24 DIAGNOSIS — L819 Disorder of pigmentation, unspecified: Secondary | ICD-10-CM | POA: Diagnosis not present

## 2022-03-15 NOTE — Progress Notes (Signed)
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 ------------------------------------------------   03/16/21- 72 year old male never smoker followed for Asthma, allergic Rhinitis, complicated by GERD, history of insect venom allergy, peripheral venous insufficiency, (son died of Hermansky- Pudlak syndrome), Scoliosis thoracolumbar,  -Proair hfa, EpiPen (beekeeper),Stiolto 2.5, Daily saline nasal rinse, Covid vax- 4 Phizer Flu vax- had Milon Score, NP 12/6 for asthma> doxy, pred taper Doing very well with his asthma.  We discussed meds and we will refill Stiolto and his rescue inhaler.  Stiolto was used intermittently when needed.  He may have had 2 or 3 exacerbations in the past year, mostly with viral infections.  Rhinitis has been fairly well controlled. He keeps an EpiPen because he still works as a Pension scheme manager but has not needed to use it. Doing maintenance exercises for scoliosis, hoping to avoid surgery.  03/17/22- 72 year old male never smoker followed for Asthma, allergic Rhinitis, complicated by GERD, history of insect venom allergy, peripheral venous insufficiency, (son died of Hermansky- Pudlak syndrome), Scoliosis thoracolumbar, Covid infection,  -Proair hfa, EpiPen (beekeeper),Stiolto 2.5, Daily saline nasal rinse, Covid vax- 4 Phizer Flu vax- had Saw Cobb, NP 02/03/22  Viral URI  We sent augmentin 02/11/22 -----Pt states he is doing well He had COVID in September 2023, treated with molnupiravir by his primary physician.  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  Now on omeprazole- not working as well as he would like.Marland Kitchen  He had stopped inhalers while doing well but has resumed Stiolto now.  Only occasionally needs rescue inhaler. Discussed pneumonia vaccine. CXR 02/03/22- IMPRESSION: 1. No active cardiopulmonary disease. 2. Mild left costophrenic angle scarring, unchanged from multiple prior radiographs.  ROS-see HPI + = positiv Constitutional:   No-   weight loss, night sweats, fevers, chills, fatigue, lassitude. HEENT:   No-  headaches, difficulty swallowing, tooth/dental problems, sore throat,       No-  sneezing, itching, ear ache, nasal congestion, post nasal drip,  CV:  No-   chest pain, orthopnea, PND, swelling in lower extremities, anasarca, dizziness, palpitations Resp: +shortness of breath with exertion or at rest.                productive cough,  + non-productive cough,  No- coughing up of blood.               change in color of mucus.  No- wheezing.   Skin: No-   rash or lesions. GI:  No-   heartburn, indigestion, abdominal pain, nausea, vomiting,  GU: MS:  .+ back pain Neuro-     nothing unusual Psych:  No- change in mood or affect. No depression or anxiety.  No memory loss.  OBJ General- Alert, Oriented, Affect-appropriate, Distress- none acute Skin-  Lymphadenopathy- none Head- atraumatic            Eyes- Gross vision intact, PERRLA, conjunctivae clear secretions            Ears- Hearing, canals-normal            Nose- Clear, no-Septal dev, mucus, polyps, erosion, perforation  Throat- Mallampati II, drainage- none, tonsils- atrophic.  Neck- flexible , trachea midline, no stridor , thyroid nl, carotid no bruit Chest - symmetrical excursion , unlabored           Heart/CV- RRR , no murmur , no gallop  , no rub, nl s1 s2                           - JVD- none , edema-none, stasis changes- none, varices- none           Lung- cough-none, unlabored, dullness-none, rub- none           Chest  wall-  Abd-  Br/ Gen/ Rectal- Not done, not indicated Extrem- cyanosis- none, clubbing, none, atrophy- none, strength- nl.  Neuro- grossly intact to observation

## 2022-03-17 ENCOUNTER — Encounter: Payer: Self-pay | Admitting: Internal Medicine

## 2022-03-17 ENCOUNTER — Ambulatory Visit: Payer: Medicare PPO | Admitting: Internal Medicine

## 2022-03-17 VITALS — BP 128/68 | HR 90 | Ht 70.0 in | Wt 200.8 lb

## 2022-03-17 DIAGNOSIS — J4520 Mild intermittent asthma, uncomplicated: Secondary | ICD-10-CM

## 2022-03-17 DIAGNOSIS — Z91038 Other insect allergy status: Secondary | ICD-10-CM | POA: Diagnosis not present

## 2022-03-17 DIAGNOSIS — Z23 Encounter for immunization: Secondary | ICD-10-CM

## 2022-03-17 NOTE — Patient Instructions (Signed)
Order- Prevnar-20 pneumonia vaccine  Suggest you discuss your acid blocker strategy with Dr Danise Mina

## 2022-03-18 IMAGING — MR MR LUMBAR SPINE W/O CM
5 of 6 series · 29 of 48 positions shown · non-contrast
Comparison: Radiographs February 26, 2020.

CLINICAL DATA: Low back pain.

EXAM:
MRI LUMBAR SPINE WITHOUT CONTRAST
TECHNIQUE: Multiplanar, multisequence MR imaging of the lumbar spine was
performed. No intravenous contrast was administered.

[Series 2: T2 · sagittal · 4.0mm · 0.81mm/px · 6 of 20 slices shown (1 of 3)]
[im 1/20]
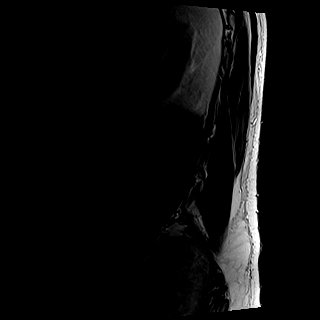
[im 4/20]
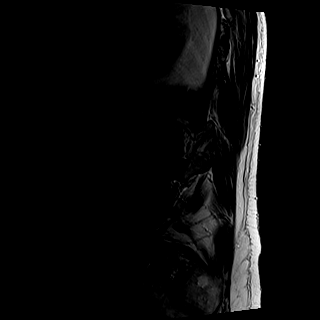
[im 8/20]
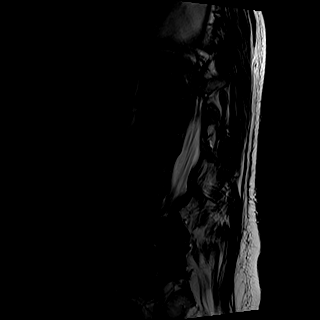
[im 12/20]
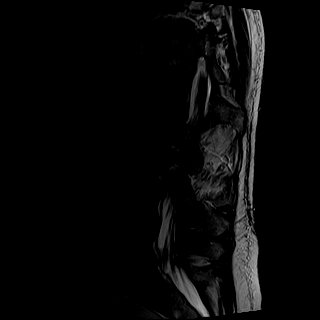
[im 16/20]
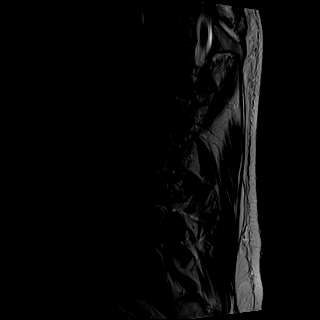
[im 20/20]
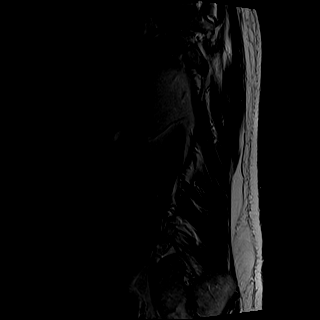

[Series 3: T1 · sagittal · 4.0mm · 0.41mm/px · 6 of 20 slices shown (1 of 2)]
[im 1/20]
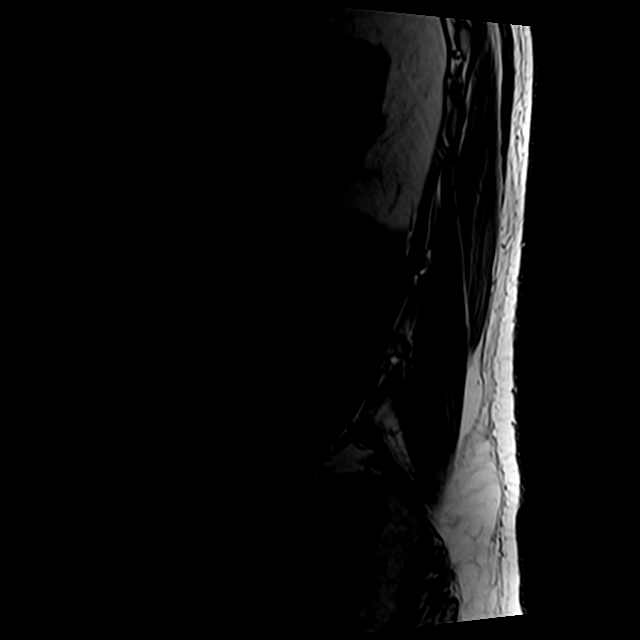
[im 4/20]
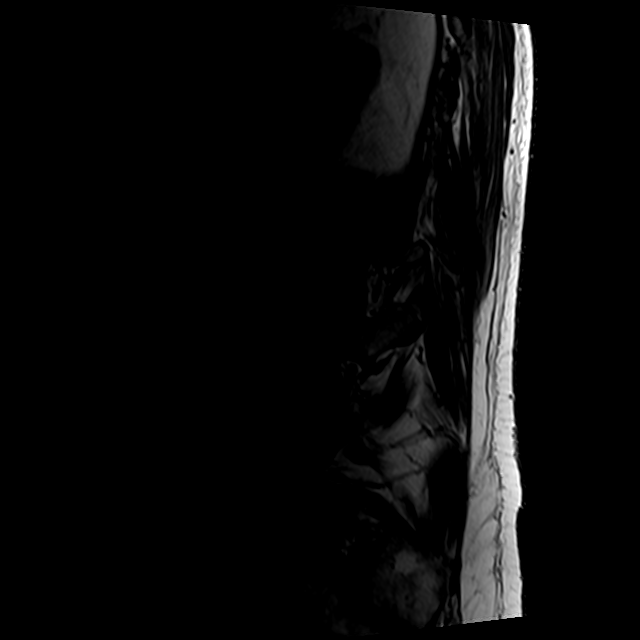
[im 8/20]
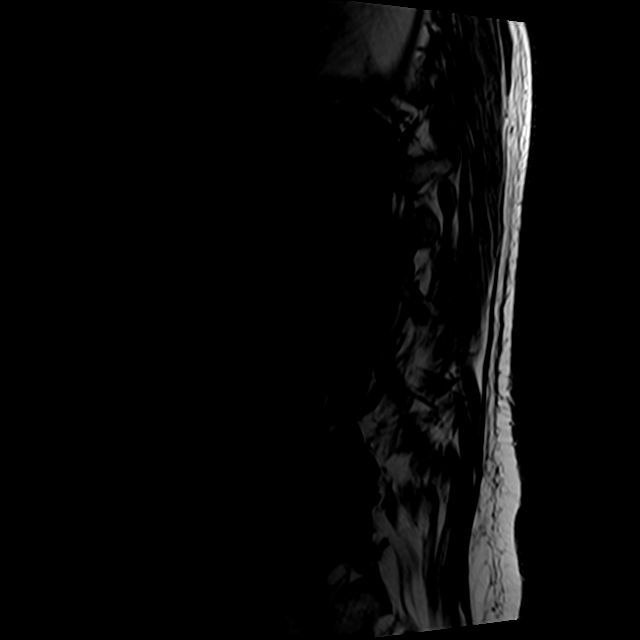
[im 12/20]
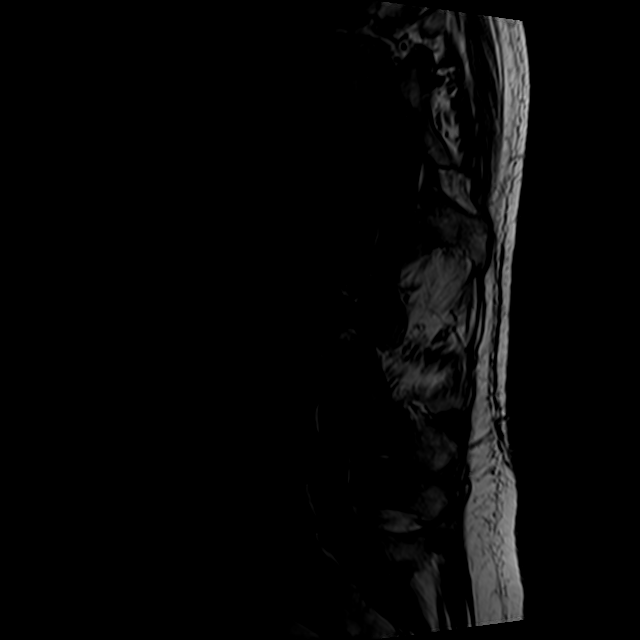
[im 16/20]
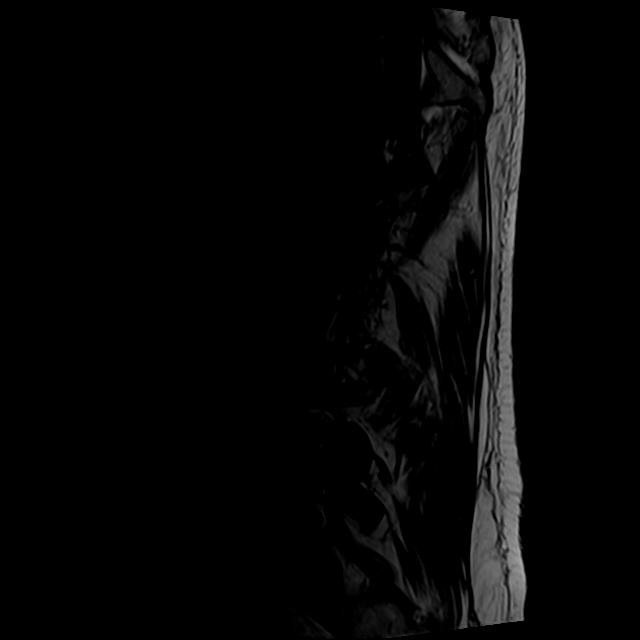
[im 20/20]
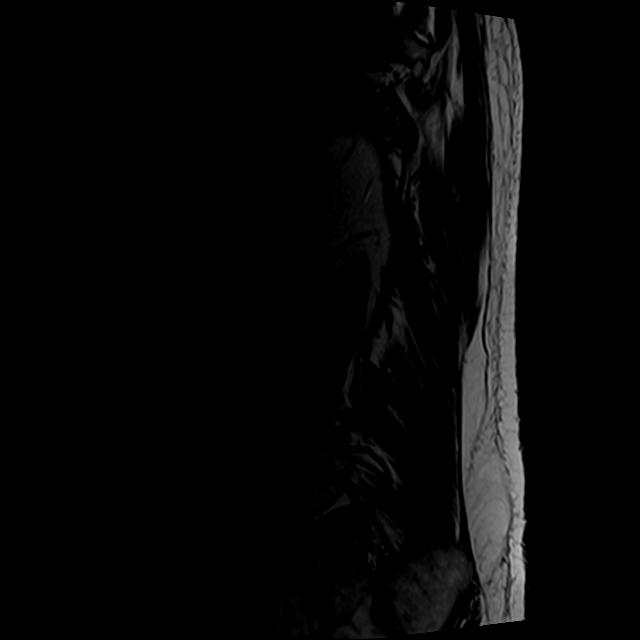

[Series 5: T2 · axial · 4.0mm · 0.78mm/px · z∈[-117,+109]mm · 9 of 39 slices shown (2 of 3)]
[im 1/39]
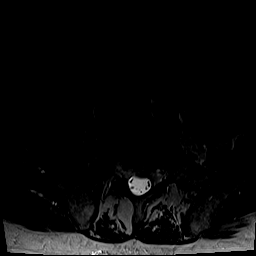
[im 7/39]
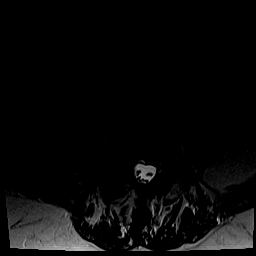
[im 11/39]
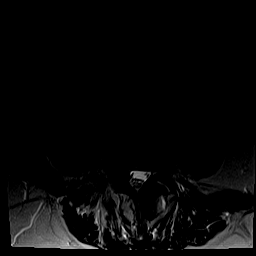
[im 18/39]
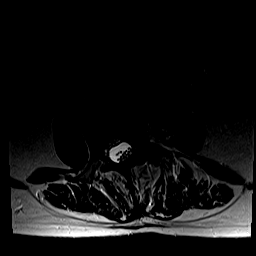
[im 21/39]
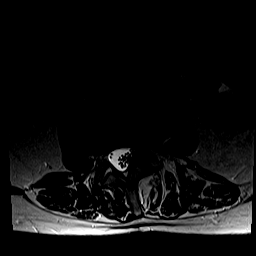
[im 28/39]
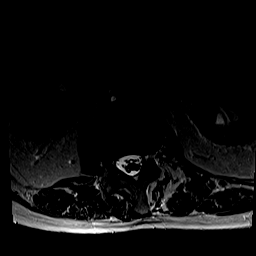
[im 32/39]
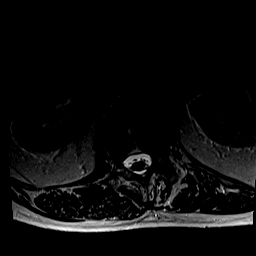
[im 35/39]
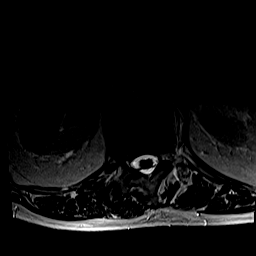
[im 39/39]
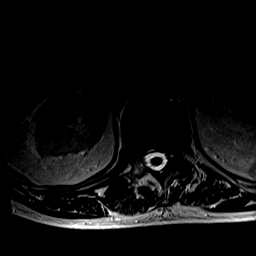

[Series 6: T1 · axial · 4.0mm · 0.39mm/px · z∈[-117,-88]mm · 2 of 39 slices shown (2 of 2)]
[im 1/39]
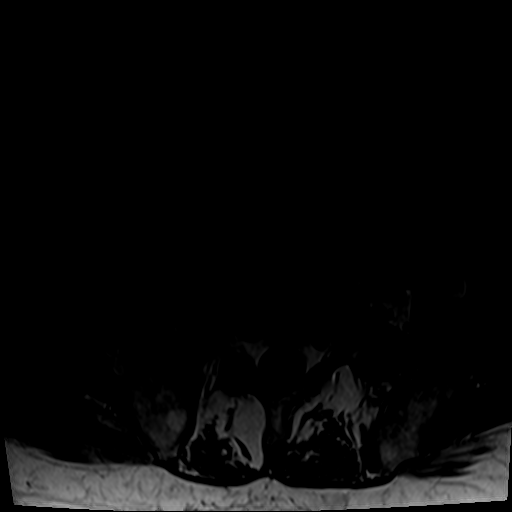
[im 7/39]
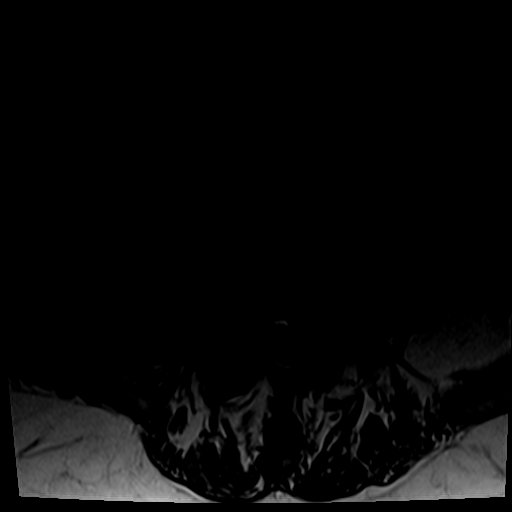

[Series 7: T2 · coronal · 4.0mm · 0.81mm/px · 6 of 20 slices shown (3 of 3)]
[im 1/20]
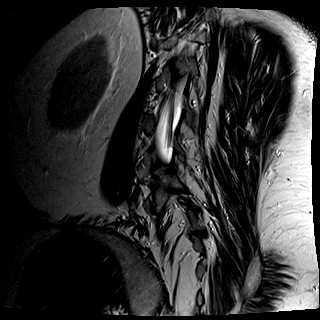
[im 4/20]
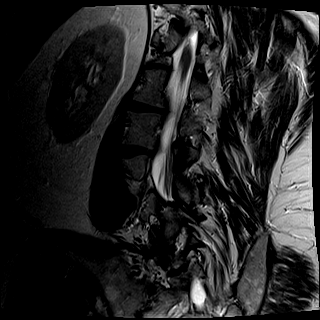
[im 8/20]
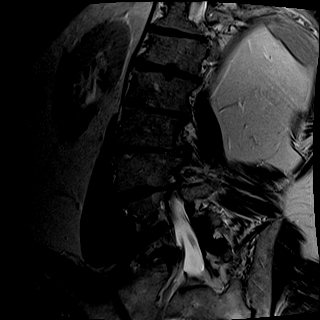
[im 12/20]
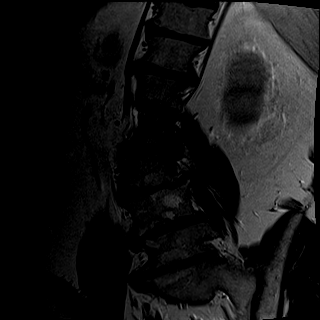
[im 16/20]
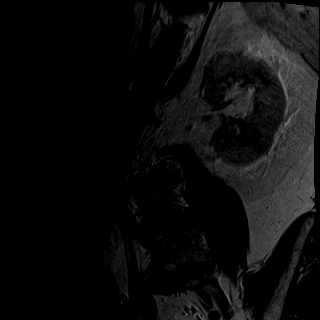
[im 20/20]
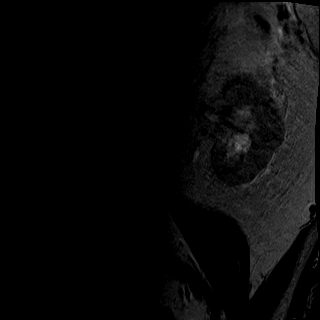

[29 of 48 positions shown; findings below may reference images not displayed]

FINDINGS: Segmentation:  Standard.

Alignment: Dextroconvex scoliosis of the lumbar spine. Small
retrolistheses from L2-3 through L5-S1.

Vertebrae:  No fracture, evidence of discitis, or bone lesion.

Conus medullaris and cauda equina: Conus extends to the L2 level.
Conus and cauda equina appear normal.

Paraspinal and other soft tissues: Negative.

Disc levels:

T12-L1: No spinal canal or neural foraminal stenosis.

L1-2: Facet degenerative changes. Mild left neural foraminal
narrowing. No spinal canal stenosis.

L2-3: Shallow disc bulge with left foraminal/far lateral disc
osteophyte complex and mild facet degenerative changes. Mild left
neural foraminal narrowing. No spinal canal stenosis.

L3-4: Disc bulge with left foraminal/far lateral disc osteophyte
complex, mild right and moderate left facet degenerative changes and
mild ligamentum flavum redundancy. Moderate left neural foraminal
narrowing. No significant spinal canal stenosis.

L4-5: Disc bulge with superimposed right foraminal/far lateral disc
protrusion and moderate facet degenerative changes resulting in
severe right and mild left neural foraminal narrowing. No
significant spinal canal stenosis.

L5-S1: Disc bulge with superimposed right foraminal/far lateral disc
protrusion advanced right and mild left facet degenerative changes.
Severe right neural foraminal stenosis. No significant spinal canal
stenosis.
IMPRESSION: 1. Dextroscoliosis and spondylosis resulting in multilevel neural
foraminal narrowing, severe on the right at L4-5 and L5-S1, and
moderate on the left at L3-4.
2. No high-grade spinal canal stenosis at any level.

## 2022-04-13 ENCOUNTER — Encounter: Payer: Self-pay | Admitting: Internal Medicine

## 2022-04-13 NOTE — Assessment & Plan Note (Signed)
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

## 2022-04-13 NOTE — Assessment & Plan Note (Signed)
Mild intermittent uncomplicated.  Airway inflammation after COVID seems largely resolved now.  We did discuss vaccines and can give him Prevnar 20. Plan-Prevnar 20.  Continue current asthma meds.

## 2022-05-18 DIAGNOSIS — M7541 Impingement syndrome of right shoulder: Secondary | ICD-10-CM | POA: Diagnosis not present

## 2022-07-28 ENCOUNTER — Other Ambulatory Visit: Payer: Self-pay | Admitting: Family Medicine

## 2022-07-28 DIAGNOSIS — E538 Deficiency of other specified B group vitamins: Secondary | ICD-10-CM

## 2022-07-28 DIAGNOSIS — N401 Enlarged prostate with lower urinary tract symptoms: Secondary | ICD-10-CM

## 2022-07-28 DIAGNOSIS — E785 Hyperlipidemia, unspecified: Secondary | ICD-10-CM

## 2022-08-02 ENCOUNTER — Other Ambulatory Visit (INDEPENDENT_AMBULATORY_CARE_PROVIDER_SITE_OTHER): Payer: Medicare PPO

## 2022-08-02 DIAGNOSIS — N401 Enlarged prostate with lower urinary tract symptoms: Secondary | ICD-10-CM | POA: Diagnosis not present

## 2022-08-02 DIAGNOSIS — E785 Hyperlipidemia, unspecified: Secondary | ICD-10-CM

## 2022-08-02 DIAGNOSIS — R3914 Feeling of incomplete bladder emptying: Secondary | ICD-10-CM

## 2022-08-02 DIAGNOSIS — E538 Deficiency of other specified B group vitamins: Secondary | ICD-10-CM

## 2022-08-02 LAB — VITAMIN B12: Vitamin B-12: 380 pg/mL (ref 211–911)

## 2022-08-02 LAB — LIPID PANEL
Cholesterol: 151 mg/dL (ref 0–200)
HDL: 38.6 mg/dL — ABNORMAL LOW (ref >39.00)
LDL Cholesterol: 97 mg/dL (ref 0–99)
NonHDL: 112.34
Total CHOL/HDL Ratio: 4
Triglycerides: 78 mg/dL (ref 0.0–149.0)
VLDL: 15.6 mg/dL (ref 0.0–40.0)

## 2022-08-02 LAB — COMPREHENSIVE METABOLIC PANEL
ALT: 14 U/L (ref 0–53)
AST: 14 U/L (ref 0–37)
Albumin: 3.9 g/dL (ref 3.5–5.2)
Alkaline Phosphatase: 44 U/L (ref 39–117)
BUN: 21 mg/dL (ref 6–23)
CO2: 27 mEq/L (ref 19–32)
Calcium: 8.7 mg/dL (ref 8.4–10.5)
Chloride: 104 mEq/L (ref 96–112)
Creatinine, Ser: 0.94 mg/dL (ref 0.40–1.50)
GFR: 82.33 mL/min (ref >60.00)
Glucose, Bld: 86 mg/dL (ref 70–99)
Potassium: 4.3 mEq/L (ref 3.5–5.1)
Sodium: 138 mEq/L (ref 135–145)
Total Bilirubin: 0.8 mg/dL (ref 0.2–1.2)
Total Protein: 6.2 g/dL (ref 6.0–8.3)

## 2022-08-02 LAB — PSA: PSA: 1.47 ng/mL (ref 0.10–4.00)

## 2022-08-05 ENCOUNTER — Other Ambulatory Visit: Payer: Self-pay | Admitting: Family Medicine

## 2022-08-05 NOTE — Telephone Encounter (Signed)
Called and spoke with patient, he states he is not in need of a refill of this at this time. States he will reach out to [place] if he does.

## 2022-08-09 ENCOUNTER — Ambulatory Visit (INDEPENDENT_AMBULATORY_CARE_PROVIDER_SITE_OTHER): Payer: Medicare PPO | Admitting: Family Medicine

## 2022-08-09 ENCOUNTER — Encounter: Payer: Self-pay | Admitting: Family Medicine

## 2022-08-09 VITALS — BP 132/82 | HR 76 | Temp 97.5°F | Ht 69.0 in | Wt 197.1 lb

## 2022-08-09 DIAGNOSIS — F5104 Psychophysiologic insomnia: Secondary | ICD-10-CM | POA: Insufficient documentation

## 2022-08-09 DIAGNOSIS — G4700 Insomnia, unspecified: Secondary | ICD-10-CM | POA: Insufficient documentation

## 2022-08-09 DIAGNOSIS — N401 Enlarged prostate with lower urinary tract symptoms: Secondary | ICD-10-CM

## 2022-08-09 DIAGNOSIS — Z0000 Encounter for general adult medical examination without abnormal findings: Secondary | ICD-10-CM | POA: Diagnosis not present

## 2022-08-09 DIAGNOSIS — E538 Deficiency of other specified B group vitamins: Secondary | ICD-10-CM

## 2022-08-09 DIAGNOSIS — F5101 Primary insomnia: Secondary | ICD-10-CM

## 2022-08-09 DIAGNOSIS — M4186 Other forms of scoliosis, lumbar region: Secondary | ICD-10-CM

## 2022-08-09 DIAGNOSIS — Z91038 Other insect allergy status: Secondary | ICD-10-CM

## 2022-08-09 DIAGNOSIS — K219 Gastro-esophageal reflux disease without esophagitis: Secondary | ICD-10-CM

## 2022-08-09 DIAGNOSIS — J4521 Mild intermittent asthma with (acute) exacerbation: Secondary | ICD-10-CM

## 2022-08-09 DIAGNOSIS — Z7189 Other specified counseling: Secondary | ICD-10-CM

## 2022-08-09 DIAGNOSIS — E785 Hyperlipidemia, unspecified: Secondary | ICD-10-CM

## 2022-08-09 MED ORDER — CYANOCOBALAMIN 500 MCG PO TABS: 1000.0000 ug | ORAL_TABLET | Freq: Every day | ORAL | Status: DC

## 2022-08-09 NOTE — Assessment & Plan Note (Signed)
Chronic issue, worse with recent increased stressors.  Discussed OTC vs prescription medication management as well as bedtime routine.

## 2022-08-09 NOTE — Assessment & Plan Note (Signed)
Followed by pulmonologist 

## 2022-08-09 NOTE — Patient Instructions (Addendum)
Bring [place] a copy of your living will.  Continue B12. May take other vitamins during winter months.  Good to see you today Return as needed or in 1 year for next physical.

## 2022-08-09 NOTE — Assessment & Plan Note (Signed)
Stable period on PRN pepcid.  

## 2022-08-09 NOTE — Assessment & Plan Note (Signed)
Preventative protocols reviewed and updated unless pt declined. Discussed healthy diet and lifestyle.  

## 2022-08-09 NOTE — Progress Notes (Signed)
Ph: [phone] Fax: [phone]   Patient ID: Terrence Middleton, male    DOB: 02/19/1951, 72 y.o.   MRN: [id number]  This visit was conducted in person.  BP 132/82   Pulse 76   Temp (!) 97.5 F (36.4 C) (Temporal)   Ht 5\' 9"  (1.753 m)   Wt 197 lb 2 oz (89.4 kg)   SpO2 96%   BMI 29.11 kg/m    CC: CPE Subjective:   HPI: Terrence Middleton is a 72 y.o. male presenting on 08/09/2022 for Medicare Wellness (Pt provided [employer] health form to be completed.  )    Hearing Screening   500Hz  1000Hz  2000Hz  4000Hz   Right ear 20 20 20  40  Left ear 20 20 25  0  Vision Screening - Comments:: Last eye exam, 11/2021.  Flowsheet Row Office Visit from 08/09/2022 in [facility] [place]  PHQ-2 Total Score 0          08/09/2022   10:19 AM 06/30/2021    3:10 PM 02/26/2020    4:04 PM 01/29/2019    9:59 AM 01/19/2018    2:22 PM  Fall Risk   Falls in the past year? 0 0 0 0 0  Number falls in past yr:  0     Injury with Fall?  0     Risk for fall due to :  No Fall Risks     Follow up  Falls prevention discussed      Post-COVID sequelae for 1+ months (COVID infection 11/2021).  Saw pulm Dr Maple Hudson - continues Stiolto PRN with PRN rescue inhaler.   Known lumbar scoliosis, DDD with R radiculopathy, facet arthropathy.  MRI Lumbar spine without contrast September 08, 2020 IMPRESSION: 1. Dextroscoliosis and spondylosis resulting in multilevel neural foraminal narrowing, severe on the right at L4-5 and L5-S1, and moderate on the left at L3-4. 2. No high-grade spinal canal stenosis at any level. Established with neurosurgery Dr Marcell Barlow at [place] - back pain improved with ESI and PT for months.  Considering 2nd opinion with Dr Noel Gerold at [facility].   Saw [facility] Ranell Patrick) for R shoulder pain - improved with steroid shot and home exercises.   Colon spasms managed with PRN dicyclomine (GI).   Preventative: COLONOSCOPY Date: 11/2013 WNL good for 10 yrs  Juanda Chance). No bowel changes or blood in stool.  Prostate screening - continues yearly PSA. Nocturia x1.  Lung cancer screening - not eligible  Flu shot yearly COVID vaccine Pfizer 04/2019 x2, booster 12/2019, 10/2020, bivalent 03/2021, 02/2022 Td 09/08/2005. Tdap 09-Sep-2015, 06/2019 Pneumovax - 02/2015, prevnar-13 09-09-2015 Prevnar-20 03/2022 zostavax - 11/2012 Shingrix - 02/2019, 09/2019 Advanced directives discussion: has all this at home. Wife is HCPOA. Asked to bring [place] copy.  Seat belt use discussed.  Sunscreen use discussed. No changing moles on skin. Sees Dr Yetta Barre yearly (h/o BCC) Sleep - averaging 6-7 hours/night  Non smoker. Previous chewing tobacco.  Alcohol - 1 light beer a few times a week  Dentist q6 mo  Eye exam yearly  Bowel - no constipation  Bladder - no incontinence    Lives with wife, dog Manufacturing engineer).  Grown children. 33yo Terrence Middleton son passed away 09-09-2014 (pulm fibrosis - Hermanske-Pudlike syndrome)  Occupation: retired - was Technical sales engineer at [employer]  Activity: no regular exercise. Does walk at work and stays active at home.  Diet: good water, fruits/vegetables daily     Relevant past medical, surgical, family and social history reviewed and  updated as indicated. Interim medical history since our last visit reviewed. Allergies and medications reviewed and updated. Outpatient Medications Prior to Visit  Medication Sig Dispense Refill   albuterol (VENTOLIN HFA) 108 (90 Base) MCG/ACT inhaler INHALE 2 PUFFS INTO THE LUNGS EVERY 4 HOURS AS NEEDED FOR WHEEZING OR SHORTNESS OF BREATH. 18 each 12   Cholecalciferol (VITAMIN D3) 25 MCG (1000 UT) CAPS Take 1 capsule by mouth daily.     EPINEPHrine 0.3 mg/0.3 mL IJ SOAJ injection Inject into thigh for severe allergic reaction 1 each 12   famotidine (PEPCID) 20 MG tablet Take 20 mg by mouth daily.     Sodium Chloride-Sodium Bicarb 1.57 g PACK Place into the nose. Uses every morning     Tiotropium Bromide-Olodaterol (STIOLTO RESPIMAT) 2.5-2.5  MCG/ACT AERS INHALE 2 PUFFS BY MOUTH INTO THE LUNGS DAILY 4 g 12   Ascorbic Acid (VITAMIN C) 1000 MG tablet Take 1,000 mg by mouth daily.     vitamin B-12 (V-R VITAMIN B-12) 500 MCG tablet Take 1 tablet (500 mcg total) by mouth daily.     Zinc 100 MG TABS Take 1 tablet by mouth daily.     omeprazole (PRILOSEC) 40 MG capsule TAKE 1 CAPSULE (40 MG TOTAL) BY MOUTH DAILY. FOR 3 WEEKS DAILY THEN AS NEEDED 90 capsule 1   No facility-administered medications prior to visit.     Per HPI unless specifically indicated in ROS section below Review of Systems  Constitutional:  Negative for activity change, appetite change, chills, fatigue, fever and unexpected weight change.  HENT:  Negative for hearing loss.   Eyes:  Negative for visual disturbance.  Respiratory:  Negative for cough, chest tightness, shortness of breath and wheezing.   Cardiovascular:  Negative for chest pain, palpitations and leg swelling.  Gastrointestinal:  Negative for abdominal distention, abdominal pain, blood in stool, constipation, diarrhea, nausea and vomiting.  Genitourinary:  Negative for difficulty urinating and hematuria.  Musculoskeletal:  Negative for arthralgias, myalgias and neck pain.  Skin:  Negative for rash.  Neurological:  Negative for dizziness, seizures, syncope and headaches.  Hematological:  Negative for adenopathy. Does not bruise/bleed easily.  Psychiatric/Behavioral:  Negative for dysphoric mood. The patient is not nervous/anxious.     Objective:  BP 132/82   Pulse 76   Temp (!) 97.5 F (36.4 C) (Temporal)   Ht 5\' 9"  (1.753 m)   Wt 197 lb 2 oz (89.4 kg)   SpO2 96%   BMI 29.11 kg/m   Wt Readings from Last 3 Encounters:  08/09/22 197 lb 2 oz (89.4 kg)  03/17/22 200 lb 12.8 oz (91.1 kg)  02/03/22 196 lb 4.8 oz (89 kg)      Physical Exam Vitals and nursing note reviewed.  Constitutional:      General: He is not in acute distress.    Appearance: Normal appearance. He is well-developed. He is  not ill-appearing.  HENT:     Head: Normocephalic and atraumatic.     Right Ear: Hearing, tympanic membrane, ear canal and external ear normal.     Left Ear: Hearing, tympanic membrane, ear canal and external ear normal.     Mouth/Throat:     Mouth: Mucous membranes are moist.     Pharynx: Oropharynx is clear. No oropharyngeal exudate or posterior oropharyngeal erythema.  Eyes:     General: No scleral icterus.    Extraocular Movements: Extraocular movements intact.     Conjunctiva/sclera: Conjunctivae normal.     Pupils: Pupils are equal,  round, and reactive to light.  Neck:     Thyroid: No thyroid mass or thyromegaly.     Vascular: No carotid bruit.  Cardiovascular:     Rate and Rhythm: Normal rate and regular rhythm.     Pulses: Normal pulses.          Radial pulses are 2+ on the right side and 2+ on the left side.     Heart sounds: Normal heart sounds. No murmur heard. Pulmonary:     Effort: Pulmonary effort is normal. No respiratory distress.     Breath sounds: Normal breath sounds. No wheezing, rhonchi or rales.  Abdominal:     General: Bowel sounds are normal. There is no distension.     Palpations: Abdomen is soft. There is no mass.     Tenderness: There is no abdominal tenderness. There is no guarding or rebound.     Hernia: No hernia is present.  Musculoskeletal:        General: Normal range of motion.     Cervical back: Normal range of motion and neck supple.     Right lower leg: No edema.     Left lower leg: No edema.  Lymphadenopathy:     Cervical: No cervical adenopathy.  Skin:    General: Skin is warm and dry.     Findings: No rash.  Neurological:     General: No focal deficit present.     Mental Status: He is alert and oriented to person, place, and time.     Comments:  Recall 3/3 Calculation 5/5 DLROW  Psychiatric:        Mood and Affect: Mood normal.        Behavior: Behavior normal.        Thought Content: Thought content normal.        Judgment:  Judgment normal.       Results for orders placed or performed in visit on 08/02/22  Vitamin B12  Result Value Ref Range   Vitamin B-12 380 211 - 911 pg/mL  PSA  Result Value Ref Range   PSA 1.47 0.10 - 4.00 ng/mL  Comprehensive metabolic panel  Result Value Ref Range   Sodium 138 135 - 145 mEq/L   Potassium 4.3 3.5 - 5.1 mEq/L   Chloride 104 96 - 112 mEq/L   CO2 27 19 - 32 mEq/L   Glucose, Bld 86 70 - 99 mg/dL   BUN 21 6 - 23 mg/dL   Creatinine, Ser 8.41 0.40 - 1.50 mg/dL   Total Bilirubin 0.8 0.2 - 1.2 mg/dL   Alkaline Phosphatase 44 39 - 117 U/L   AST 14 0 - 37 U/L   ALT 14 0 - 53 U/L   Total Protein 6.2 6.0 - 8.3 g/dL   Albumin 3.9 3.5 - 5.2 g/dL   GFR 32.44 >01.02 mL/min   Calcium 8.7 8.4 - 10.5 mg/dL  Lipid panel  Result Value Ref Range   Cholesterol 151 0 - 200 mg/dL   Triglycerides 72.5 0.0 - 149.0 mg/dL   HDL 36.64 (L) >40.34 mg/dL   VLDL 74.2 0.0 - 59.5 mg/dL   LDL Cholesterol 97 0 - 99 mg/dL   Total CHOL/HDL Ratio 4    NonHDL 112.34       08/09/2022   10:19 AM 06/30/2021    3:02 PM 02/26/2020    4:05 PM 01/29/2019    9:59 AM 01/19/2018    2:23 PM  Depression screen PHQ 2/9  Decreased Interest  0 0 0 0 0  Down, Depressed, Hopeless 0 0 0 0 0  PHQ - 2 Score 0 0 0 0 0  Altered sleeping 1      Tired, decreased energy 1      Change in appetite 0      Feeling bad or failure about yourself  0      Trouble concentrating 0      Moving slowly or fidgety/restless 0      Suicidal thoughts 0      PHQ-9 Score 2      Difficult doing work/chores Not difficult at all           08/09/2022   10:19 AM  GAD 7 : Generalized Anxiety Score  Nervous, Anxious, on Edge 0  Control/stop worrying 1  Worry too much - different things 0  Trouble relaxing 0  Restless 0  Easily annoyed or irritable 0  Afraid - awful might happen 0  Total GAD 7 Score 1  Anxiety Difficulty Not difficult at all   Assessment & Plan:   Problem List Items Addressed This Visit     Medicare  annual wellness visit, subsequent - Primary (Chronic)    I have personally reviewed the Medicare Annual Wellness questionnaire and have noted 1. The patient's medical and social history 2. Their use of alcohol, tobacco or illicit drugs 3. Their current medications and supplements 4. The patient's functional ability including ADL's, fall risks, home safety risks and hearing or visual impairment. Cognitive function has been assessed and addressed as indicated.  5. Diet and physical activity 6. Evidence for depression or mood disorders The patients weight, height, BMI have been recorded in the chart. I have made referrals, counseling and provided education to the patient based on review of the above and I have provided the pt with a written personalized care plan for preventive services. Provider list updated.. See scanned questionairre as needed for further documentation. Reviewed preventative protocols and updated unless pt declined.       Health maintenance examination (Chronic)    Preventative protocols reviewed and updated unless pt declined. Discussed healthy diet and lifestyle.       Advanced directives, counseling/discussion (Chronic)    Asked to bring [place] a copy of living will      GERD    Stable period on PRN pepcid      Allergy to insect stings    Notes mild localized swelling when he gets bee stings. No anaphylaxis type symptoms.       Dyslipidemia    Chronic off medication. Consider Lp(a) vs CAC.  The 10-year ASCVD risk score (Arnett DK, et al., 2019) is: 18.5%   Values used to calculate the score:     Age: 71 years     Sex: Male     Is Non-Hispanic African American: No     Diabetic: No     Tobacco smoker: No     Systolic Blood Pressure: 132 mmHg     Is BP treated: No     HDL Cholesterol: 38.6 mg/dL     Total Cholesterol: 151 mg/dL       Low serum vitamin B12    Discussed dietary measures. Will continue vit B12 daily.       BPH (benign prostatic  hyperplasia)    PSA stable.       Lumbar scoliosis   Mild intermittent acute asthmatic bronchitis    Followed by pulmonologist.  Insomnia    Chronic issue, worse with recent increased stressors.  Discussed OTC vs prescription medication management as well as bedtime routine.         Meds ordered this encounter  Medications   cyanocobalamin (V-R VITAMIN B-12) 500 MCG tablet    Sig: Take 2 tablets (1,000 mcg total) by mouth daily.    No orders of the defined types were placed in this encounter.   Patient Instructions  Bring [place] a copy of your living will.  Continue B12. May take other vitamins during winter months.  Good to see you today Return as needed or in 1 year for next physical.   Follow up plan: Return in about 1 year (around 08/09/2023) for annual exam, prior fasting for blood work.  Eustaquio Boyden, MD

## 2022-08-09 NOTE — Assessment & Plan Note (Signed)
Chronic off medication. Consider Lp(a) vs CAC.  The 10-year ASCVD risk score (Arnett DK, et al., 2019) is: 18.5%   Values used to calculate the score:     Age: 72 years     Sex: Male     Is Non-Hispanic African American: No     Diabetic: No     Tobacco smoker: No     Systolic Blood Pressure: 132 mmHg     Is BP treated: No     HDL Cholesterol: 38.6 mg/dL     Total Cholesterol: 151 mg/dL

## 2022-08-09 NOTE — Assessment & Plan Note (Signed)
PSA stable

## 2022-08-09 NOTE — Assessment & Plan Note (Signed)
Notes mild localized swelling when he gets bee stings. No anaphylaxis type symptoms.

## 2022-08-09 NOTE — Assessment & Plan Note (Signed)

## 2022-08-09 NOTE — Assessment & Plan Note (Signed)
Discussed dietary measures. Will continue vit B12 daily.

## 2022-08-09 NOTE — Assessment & Plan Note (Addendum)
Asked to bring [place] a copy of living will

## 2023-03-18 NOTE — Progress Notes (Signed)
 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 ------------------------------------------------   03/17/22- 73 year old male never smoker followed for Asthma, allergic Rhinitis, complicated by GERD, history of insect venom allergy , peripheral venous insufficiency, (son died of Hermansky- Pudlak syndrome), Scoliosis thoracolumbar, Covid infection,  -Proair  hfa, EpiPen  (beekeeper),Stiolto 2.5, Daily saline nasal rinse, Covid vax- 4 Phizer Flu vax- had Saw Cobb, NP 02/03/22  Viral URI  We sent augmentin  02/11/22 -----Pt states he is doing well He had COVID in September 2023, treated with molnupiravir  by his primary physician.  He had sustained fever chest congestion and myalgias followed by protracted diarrheal syndrome identified as long COVID by his primary physician.  Now on omeprazole - not working as well as he would like.SABRA  He had stopped inhalers while doing well but has resumed Stiolto now.  Only occasionally needs rescue inhaler. Discussed pneumonia vaccine. CXR 02/03/22- IMPRESSION: 1. No active cardiopulmonary disease. 2. Mild left costophrenic angle scarring, unchanged from multiple prior radiographs.  03/20/23- 73 year old male never smoker followed for Asthma, allergic Rhinitis, complicated by GERD, history of insect venom allergy , peripheral venous insufficiency, (son died of Hermansky- Pudlak syndrome), Scoliosis thoracolumbar, Covid infection,  -Proair  hfa, EpiPen  (beekeeper),Stiolto 2.5, Daily saline nasal rinse, ACT score 23 Discussed the use of AI scribe software for clinical note transcription with the patient, who gave verbal  consent to proceed.  History of Present Illness   The patient, a beekeeper with a history of asthma, presents for a routine check-up and medication refills. He reports that his asthma is well-controlled with Stiolto and a rescue inhaler, which he uses infrequently. He uses Stiolto more frequently when he has a cold or other respiratory illness. He also carries an EpiPen  due to severe local reactions to bee stings, which cause significant swelling. He has not had to use the EpiPen  in the past year.  The patient also discusses his wife's health issues. His wife was recently diagnosed with Parkinson's disease and has been experiencing sleep disturbances. The patient reports that he has been losing sleep due to his wife's health issues and the recent death of his mother-in-law. He expresses interest in arranging a sleep study for his wife.     ROS-see HPI + = positiv Constitutional:   No-   weight loss, night sweats, fevers, chills, fatigue, lassitude. HEENT:   No-  headaches, difficulty swallowing, tooth/dental problems, sore throat,       No-  sneezing, itching, ear ache, nasal congestion, post nasal drip,  CV:  No-   chest pain, orthopnea, PND, swelling in lower extremities, anasarca, dizziness, palpitations Resp: +shortness of breath with exertion or at rest.                productive cough,  + non-productive cough,  No- coughing up of blood.               change in color of mucus.  No- wheezing.   Skin: No-   rash or lesions. GI:  No-   heartburn, indigestion, abdominal pain, nausea, vomiting,  GU: MS:  .+ back pain Neuro-     nothing unusual Psych:  No- change in mood or affect. No depression or anxiety.  No memory loss.  OBJ General- Alert,  Oriented, Affect-appropriate, Distress- none acute Skin-  Lymphadenopathy- none Head- atraumatic            Eyes- Gross vision intact, PERRLA, conjunctivae clear secretions            Ears- Hearing, canals-normal            Nose- Clear, no-Septal  dev, mucus, polyps, erosion, perforation             Throat- Mallampati II, drainage- none, tonsils- atrophic.  Neck- flexible , trachea midline, no stridor , thyroid  nl, carotid no bruit Chest - symmetrical excursion , unlabored           Heart/CV- RRR , no murmur , no gallop  , no rub, nl s1 s2                           - JVD- none , edema-none, stasis changes- none, varices- none           Lung- cough-none, unlabored, dullness-none, rub- none           Chest wall-  Abd-  Br/ Gen/ Rectal- Not done, not indicated Extrem- cyanosis- none, clubbing, none, atrophy- none, strength- nl.  Neuro- grossly intact to observation  Assessment and Plan    Asthma Well controlled with rare use of rescue inhaler. Uses Stiolto as needed during illness. No recent exertional symptoms or exposure to triggers. -Refill Stiolto and ProAir  inhalers.  Bee sting allergy  Large local reactions to bee stings. No systemic reactions reported. Has EpiPen  but has never needed to use it. -Refill EpiPen  prescription.  Sleep disturbance Patient reports difficulty sleeping due to wife's nocturnal symptoms and recent family stressors. No specific sleep disorder diagnosed. -Consider referral for sleep study if symptoms persist or worsen.  COVID-19 (past) Patient had long COVID with GI symptoms after treatment with molnupiravir . No current symptoms. -If re-infected, consider Paxlovid for antiviral treatment.  Pneumococcal vaccination Patient received Prevnar 20 in 2024. -No further pneumococcal vaccination needed at this time.  General Health Maintenance -Discussed potential future need for RSV vaccination. -Encouraged patient to seek medical attention if sudden onset of severe symptoms suggestive of COVID-19.

## 2023-03-20 ENCOUNTER — Encounter: Payer: Self-pay | Admitting: Internal Medicine

## 2023-03-20 ENCOUNTER — Ambulatory Visit: Payer: Medicare PPO | Admitting: Internal Medicine

## 2023-03-20 VITALS — BP 128/80 | HR 86 | Ht 70.0 in | Wt 216.6 lb

## 2023-03-20 DIAGNOSIS — J4520 Mild intermittent asthma, uncomplicated: Secondary | ICD-10-CM

## 2023-03-20 MED ORDER — EPINEPHRINE 0.3 MG/0.3ML IJ SOAJ: INTRAMUSCULAR | 12 refills | Status: AC

## 2023-03-20 MED ORDER — ALBUTEROL SULFATE HFA 108 (90 BASE) MCG/ACT IN AERS: INHALATION_SPRAY | RESPIRATORY_TRACT | 12 refills | Status: AC

## 2023-03-20 MED ORDER — STIOLTO RESPIMAT 2.5-2.5 MCG/ACT IN AERS: INHALATION_SPRAY | RESPIRATORY_TRACT | 12 refills | Status: AC

## 2023-03-20 NOTE — Patient Instructions (Signed)
 Refills sent for Ventolin, Stiolto and Epipen  Please call if we can help

## 2023-04-05 DIAGNOSIS — D225 Melanocytic nevi of trunk: Secondary | ICD-10-CM | POA: Diagnosis not present

## 2023-04-05 DIAGNOSIS — L300 Nummular dermatitis: Secondary | ICD-10-CM | POA: Diagnosis not present

## 2023-04-05 DIAGNOSIS — L812 Freckles: Secondary | ICD-10-CM | POA: Diagnosis not present

## 2023-04-05 DIAGNOSIS — L821 Other seborrheic keratosis: Secondary | ICD-10-CM | POA: Diagnosis not present

## 2023-04-05 DIAGNOSIS — Z85828 Personal history of other malignant neoplasm of skin: Secondary | ICD-10-CM | POA: Diagnosis not present

## 2023-07-17 ENCOUNTER — Ambulatory Visit: Payer: Self-pay | Admitting: *Deleted

## 2023-07-17 DIAGNOSIS — F5101 Primary insomnia: Secondary | ICD-10-CM

## 2023-07-17 NOTE — Telephone Encounter (Signed)
 Copied from CRM [id number]. Topic: Clinical - Red Word Triage >> Jul 17, 2023 12:32 PM Deaijah H wrote: Red Word that prompted transfer to Nurse Triage: Sleeping issues getting worse and worse Reason for Disposition  Insomnia is an ongoing problem (> 2 weeks)    Wanting suggestions to try before he is seen on June 6th for his physical.  Answer Assessment - Initial Assessment Questions 1. DESCRIPTION: "Tell me about your sleeping problem."      I talked with Dr. Mariam Shingles about this before.    I'm having trouble waking up at 3:00 AM every night for the last 3 months.   He did not prescribe anything for me.    I take 1/2 a Benadryl but I wake up sleepy.    I have a physical on June 6 coming up.   I was wanting some suggestions before then if possible.   Can he respond via MyChart?   That would be great.  I tried melatonin  5 mg and then 1/2 of that.   It did not work.     I fall asleep easily but can't stay asleep. 2. ONSET: "How long have you been having trouble sleeping?" (e.g., days, weeks, months)     3 months now and getting worse 3. RECURRENT: "Have you had sleeping problems before?"  If Yes, ask: "What happened that time?" "What helped your sleeping problem go away in the past?"      Not asked 4. STRESS: "Is there anything in your life that is making you feel stressed or tense?"     Stress      Can he suggest something for me to try.    5. PAIN: "Do you have any pain that is keeping you awake?" (e.g., back pain, headache, abdomen pain)     No pain 6. CAFFEINE  ABUSE: "Do you drink caffeinated beverages, and how much each day?" (e.g., coffee, tea, colas)     Not asked 7. ALCOHOL USE OR SUBSTANCE USE (DRUG USE): "Do you drink alcohol or use any illegal drugs?"     Not asked 8. OTHER SYMPTOMS: "Do you have any other symptoms?"  (e.g., difficulty breathing)     No  Protocols used: Insomnia-A-AH  Chief Complaint: Insomnia     Waking up every night at 3:00 AM.   Falls asleep easily.    Has a  physical coming up on June 6 so was wanting some suggestions of what to try between now and then.   Pt. Said Dr. Mariam Shingles can respond via MyChart would be good.    He has tried melatonin and Benadryl.   See notes for details. Symptoms: wakes up every night at 3:00 AM. Frequency: for the last 3 months now Pertinent Negatives: Patient denies pain or anything like that other than usual stress. Disposition: [] ED /[] Urgent Care (no appt availability in office) / [x] Appointment(In office/virtual)/ []  [facility]/ [] Home Care/ [] Refused Recommended Disposition /[] [facility]/ []  Follow-up with PCP Additional Notes: Pt already has an appt for his physical.   Requesting suggestions he can try between now and then via MyChart.   Message sent.

## 2023-07-19 ENCOUNTER — Other Ambulatory Visit: Payer: Self-pay | Admitting: Family Medicine

## 2023-07-19 DIAGNOSIS — N401 Enlarged prostate with lower urinary tract symptoms: Secondary | ICD-10-CM

## 2023-07-19 DIAGNOSIS — E785 Hyperlipidemia, unspecified: Secondary | ICD-10-CM

## 2023-07-19 DIAGNOSIS — E538 Deficiency of other specified B group vitamins: Secondary | ICD-10-CM

## 2023-07-19 NOTE — Telephone Encounter (Addendum)
 Replied via mychart.  Please see the MyChart message reply(ies) for my assessment and plan.  The patient gave consent for this Medical Advice Message and is aware that it may result in a bill to their insurance company as well as the possibility that this may result in a co-payment or deductible. They are an established patient, but are not seeking medical advice exclusively about a problem treated during an in person or video visit in the last 7 days. I did not recommend an in person or video visit within 7 days of my reply.  I spent a total of 5 minutes cumulative time within 7 days through MyChart messaging Claire Crick, MD

## 2023-07-24 ENCOUNTER — Encounter

## 2023-07-24 ENCOUNTER — Telehealth: Payer: Self-pay | Admitting: Family Medicine

## 2023-07-24 ENCOUNTER — Other Ambulatory Visit

## 2023-07-24 NOTE — Telephone Encounter (Signed)
 Copied from CRM [id number]. Topic: Appointments - Scheduling Inquiry for Clinic >> Jul 24, 2023  8:03 AM Freya Jesus wrote: Reason for CRM: Patient called upset that his appointment for Medicare AWV was changed from 5/29 to 5/19. Patient stated he did not agreed to that change and someone named Ruthann Cover -- [phone] called and left a voicemail regarding the change. Patient is frustrated that it keeps happening to him and would like some clarity as to why his appointment was changed.

## 2023-07-25 ENCOUNTER — Ambulatory Visit (INDEPENDENT_AMBULATORY_CARE_PROVIDER_SITE_OTHER)

## 2023-07-25 VITALS — Ht 69.5 in | Wt 208.0 lb

## 2023-07-25 DIAGNOSIS — Z0000 Encounter for general adult medical examination without abnormal findings: Secondary | ICD-10-CM | POA: Diagnosis not present

## 2023-07-25 NOTE — Progress Notes (Signed)
 Subjective:   Terrence Middleton is a 73 y.o. who presents for a Medicare Wellness preventive visit.  As a reminder, Annual Wellness Visits don't include a physical exam, and some assessments may be limited, especially if this visit is performed virtually. We may recommend an in-person follow-up visit with your provider if needed.  Visit Complete: Virtual I connected with  Terrence Middleton on 07/25/23 by a audio enabled telemedicine application and verified that I am speaking with the correct person using two identifiers.  Patient Location: Home  Provider Location: Home Office  I discussed the limitations of evaluation and management by telemedicine. The patient expressed understanding and agreed to proceed.  Vital Signs: Because this visit was a virtual/telehealth visit, some criteria may be missing or patient reported. Any vitals not documented were not able to be obtained and vitals that have been documented are patient reported.  Televisit complete as patient was scheduled unexpectedly and time didn't permit setting up video visit. Nurse unable to be onsite today.  Persons Participating in Visit: Patient.  AWV Questionnaire: No: Patient Medicare AWV questionnaire was not completed prior to this visit.  Cardiac Risk Factors include: advanced age (>67men, >72 women);dyslipidemia;male gender;obesity (BMI >30kg/m2);hypertension     Objective:     Today's Vitals   07/25/23 1053  Weight: 208 lb (94.3 kg)  Height: 5' 9.5" (1.765 m)   Body mass index is 30.28 kg/m.     07/25/2023   11:03 AM 06/30/2021    3:09 PM 06/27/2019    1:03 PM 02/18/2014   11:15 AM 11/18/2013    4:32 PM  Advanced Directives  Does Patient Have a Medical Advance Directive? Yes Yes No Yes No  Type of Estate agent of Terrence Middleton;Living will Healthcare Power of [place];Living will     Copy of Healthcare Power of Attorney in Chart? No - copy requested No - copy requested     Would  patient like information on creating a medical advance directive?  No - Patient declined No - Patient declined  No - patient declined information    Current Medications (verified) Outpatient Encounter Medications as of 07/25/2023  Medication Sig   Cholecalciferol (VITAMIN D3) 25 MCG (1000 UT) CAPS Take 1 capsule by mouth daily.   cyanocobalamin  (V-R VITAMIN B-12) 500 MCG tablet Take 2 tablets (1,000 mcg total) by mouth daily.   famotidine (PEPCID) 20 MG tablet Take 20 mg by mouth daily.   Sodium Chloride -Sodium Bicarb 1.57 g PACK Place into the nose. Uses every morning   Tiotropium Bromide-Olodaterol (STIOLTO RESPIMAT ) 2.5-2.5 MCG/ACT AERS INHALE 2 PUFFS BY MOUTH INTO THE LUNGS DAILY   albuterol  (VENTOLIN  HFA) 108 (90 Base) MCG/ACT inhaler INHALE 2 PUFFS INTO THE LUNGS EVERY 4 HOURS AS NEEDED FOR WHEEZING OR SHORTNESS OF BREATH. (Patient not taking: Reported on 07/25/2023)   EPINEPHrine  0.3 mg/0.3 mL IJ SOAJ injection Inject into thigh for severe allergic reaction (Patient not taking: Reported on 07/25/2023)   No facility-administered encounter medications on file as of 07/25/2023.    Allergies (verified) Bee venom   History: Past Medical History:  Diagnosis Date   ALLERGIC RHINITIS 02/14/2008   Young MD, Rupert Counts D    Allergy  to insect stings 02/10/2011   Hymenoptera sting    Asthma    exercise induced per patient Linder Revere)   GERD (gastroesophageal reflux disease)    PVD (posterior vitreous detachment), left    Past Surgical History:  Procedure Laterality Date   cervical fracture  2008  fractured c3 after fall   COLONOSCOPY  05/2006   colon polyps [facility])   COLONOSCOPY  11/2013   WNL [facility])   Family History  Problem Relation Age of Onset   Asthma Mother    COPD Mother    Emphysema Father    Lymphoma Father 72   Crohn's disease Son        colectomy   Pulmonary disease Son 14       passed away - Hermansky-Pudlak syndrome   Colon cancer Neg Hx    Social History    Socioeconomic History   Marital status: Married    Spouse name: Dana Duncan   Number of children: 1   Years of education: Not on file   Highest education level: Not on file  Occupational History   Occupation: Art gallery manager for [employer]  Tobacco Use   Smoking status: Never   Smokeless tobacco: Never  Vaping Use   Vaping status: Never Used  Substance and Sexual Activity   Alcohol use: Yes    Alcohol/week: 5.0 - 6.0 standard drinks of alcohol    Types: 5 - 6 Cans of beer per week    Comment: occasionally beer   Drug use: No   Sexual activity: Not on file  Other Topics Concern   Not on file  Social History Narrative   Lives with wife, dog Manufacturing engineer).  Grown children.   33yo son Terrence Middleton passed away 08-22-14 (pulm fibrosis - Hermanske-Pudlike syndrome)   Occupation: retired Technical sales engineer at [employer]: no regular exercise.  Does walk at work and stays active at home.   Diet: good water, fruits/vegetables daily   Social Drivers of Health   Financial Resource Strain: Low Risk  (07/25/2023)   Overall Financial Resource Strain (CARDIA)    Difficulty of Paying Living Expenses: Not hard at all  Food Insecurity: No Food Insecurity (07/25/2023)   Hunger Vital Sign    Worried About Running Out of Food in the Last Year: Never true    Ran Out of Food in the Last Year: Never true  Transportation Needs: No Transportation Needs (07/25/2023)   PRAPARE - Administrator, Civil Service (Medical): No    Lack of Transportation (Non-Medical): No  Physical Activity: Insufficiently Active (07/25/2023)   Exercise Vital Sign    Days of Exercise per Week: 1 day    Minutes of Exercise per Session: 60 min  Stress: Stress Concern Present (07/25/2023)   [employer] of Occupational Health - Occupational Stress Questionnaire    Feeling of Stress : Rather much  Social Connections: Moderately Integrated (07/25/2023)   Social Connection and Isolation Panel [NHANES]    Frequency  of Communication with Friends and Family: Twice a week    Frequency of Social Gatherings with Friends and Family: Twice a week    Attends Religious Services: Never    Database administrator or Organizations: Yes    Attends Engineer, structural: More than 4 times per year    Marital Status: Married    Tobacco Counseling Counseling given: Not Answered    Clinical Intake:  Pre-visit preparation completed: Yes  Pain : No/denies pain     BMI - recorded: 30.28 Nutritional Status: BMI > 30  Obese Nutritional Risks: None Diabetes: No  Lab Results  Component Value Date   HGBA1C 5.8 02/10/2020   HGBA1C 5.4 01/19/2018     How often do you need to have someone help you when you  read instructions, pamphlets, or other written materials from your doctor or pharmacy?: 1 - Never  Interpreter Needed?: No  Information entered by :: Eddis Pingleton, LPN   Activities of Daily Living     07/25/2023   10:55 AM  In your present state of health, do you have any difficulty performing the following activities:  Hearing? 0  Vision? 0  Difficulty concentrating or making decisions? 0  Comment only when he doesn't sleep well  Walking or climbing stairs? 0  Dressing or bathing? 0  Doing errands, shopping? 0  Preparing Food and eating ? N  Using the Toilet? N  In the past six months, have you accidently leaked urine? N  Do you have problems with loss of bowel control? N  Managing your Medications? N  Managing your Finances? N  Housekeeping or managing your Housekeeping? N    Patient Care Team: Claire Crick, MD as PCP - General (Family Medicine) [facility] [place] (Optometry) Faustina Hood, MD as Consulting Physician (Pulmonary Disease) Harlen Lick, MD as Attending Physician (Dermatology)  Indicate any recent Medical Services you may have received from other than [facility] providers in the past year (date may be approximate).     Assessment:    This is a routine  wellness examination for Jaecion.  Hearing/Vision screen Hearing Screening - Comments:: Denies hearing difficulties   Vision Screening - Comments:: Wears rx glasses - a little behind with routine eye exams with [facility] on [address]. Will make appt soon.   Goals Addressed             This Visit's Progress    Exercise 3x per week (30 min per time)   Not on track    Continue to stay active and healthy.     Patient Stated       Stay healthy and help wife and children stay healthy and be successful       Depression Screen     07/25/2023   10:59 AM 08/09/2022   10:19 AM 06/30/2021    3:02 PM 02/26/2020    4:05 PM 01/29/2019    9:59 AM 01/19/2018    2:23 PM 12/27/2016   11:57 AM  PHQ 2/9 Scores  PHQ - 2 Score 0 0 0 0 0 0 5  PHQ- 9 Score 4 2     6     Fall Risk     07/25/2023   11:02 AM 08/09/2022   10:19 AM 06/30/2021    3:10 PM 02/26/2020    4:04 PM 01/29/2019    9:59 AM  Fall Risk   Falls in the past year? 0 0 0 0 0  Number falls in past yr: 0  0    Injury with Fall? 0  0    Risk for fall due to : No Fall Risks  No Fall Risks    Follow up Falls evaluation completed  Falls prevention discussed      MEDICARE RISK AT HOME:  Medicare Risk at Home Any stairs in or around the home?: No If so, are there any without handrails?: No Home free of loose throw rugs in walkways, pet beds, electrical cords, etc?: Yes Adequate lighting in your home to reduce risk of falls?: Yes Life alert?: No Use of a cane, walker or w/c?: No Grab bars in the bathroom?: Yes Shower chair or bench in shower?: Yes Elevated toilet seat or a handicapped toilet?: Yes  TIMED UP AND GO:  Was the  test performed?  No  Cognitive Function: 6CIT completed        07/25/2023   11:04 AM  6CIT Screen  What Year? 0 points  What month? 0 points  What time? 0 points  Count back from 20 0 points  Months in reverse 0 points  Repeat phrase 0 points  Total Score 0 points     Immunizations Immunization History  Administered Date(s) Administered   Fluad Quad(high Dose 65+) 01/22/2019, 02/22/2022   Influenza Split 02/11/2011, 02/16/2012   Influenza, High Dose Seasonal PF 12/12/2019, 12/22/2020   Influenza,inj,Quad PF,6+ Mos 11/12/2012, 11/20/2013, 11/24/2014, 11/27/2015, 12/27/2016, 01/19/2018   Influenza-Unspecified 12/09/2022   Moderna Covid-19 Fall Seasonal Vaccine 59yrs & older 02/22/2022   Moderna Sars-Covid-2 Vaccination 12/09/2022   PFIZER(Purple Top)SARS-COV-2 Vaccination 04/13/2019, 05/04/2019, 12/12/2019, 10/23/2020   PNEUMOCOCCAL CONJUGATE-20 03/17/2022   Pfizer Covid-19 Vaccine Bivalent Booster 74yrs & up 03/19/2021   Pneumococcal Conjugate-13 02/22/2016   Pneumococcal Polysaccharide-23 02/19/2015   Td 01/04/2006   Tdap 11/27/2015, 06/27/2019   Zoster Recombinant(Shingrix) 03/04/2019, 10/03/2019   Zoster, Live 11/12/2012    Screening Tests Health Maintenance  Topic Date Due   COVID-19 Vaccine (8 - 2024-25 season) 02/03/2023   Medicare Annual Wellness (AWV)  08/09/2023   INFLUENZA VACCINE  10/06/2023   Colonoscopy  11/30/2023   DTaP/Tdap/Td (4 - Td or Tdap) 06/26/2029   Pneumonia Vaccine 42+ Years old  Completed   Hepatitis C Screening  Completed   Zoster Vaccines- Shingrix  Completed   HPV VACCINES  Aged Out   Meningococcal B Vaccine  Aged Out    Health Maintenance  Health Maintenance Due  Topic Date Due   COVID-19 Vaccine (8 - 2024-25 season) 02/03/2023   Medicare Annual Wellness (AWV)  08/09/2023   Health Maintenance Items Addressed: Patient is up to date with all health screenings. Colonoscopy is due 11/2023; Patient would like to discuss this with PCP.  Additional Screening:  Vision Screening: Recommended annual ophthalmology exams for early detection of glaucoma and other disorders of the eye.  Dental Screening: Recommended annual dental exams for proper oral hygiene  Community Resource Referral / Chronic Care  Management: CRR required this visit?  No   CCM required this visit?  No   Plan:    I have personally reviewed and noted the following in the patient's chart:   Medical and social history Use of alcohol, tobacco or illicit drugs  Current medications and supplements including opioid prescriptions. Patient is not currently taking opioid prescriptions. Functional ability and status Nutritional status Physical activity Advanced directives List of other physicians Hospitalizations, surgeries, and ER visits in previous 12 months Vitals Screenings to include cognitive, depression, and falls Referrals and appointments  In addition, I have reviewed and discussed with patient certain preventive protocols, quality metrics, and best practice recommendations. A written personalized care plan for preventive services as well as general preventive health recommendations were provided to patient.   Milind Raether E Jenessa Gillingham, LPN   06/13/8117   After Visit Summary: (MyChart) Due to this being a telephonic visit, the after visit summary with patients personalized plan was offered to patient via MyChart   Notes: PCP Follow Up Recommendations: Patient is due for a colonoscopy in September, 2025 - He wants to discuss this with PCP first.

## 2023-07-25 NOTE — Patient Instructions (Signed)
 Terrence Middleton , Thank you for taking time out of your busy schedule to complete your Annual Wellness Visit with me. I enjoyed our conversation and look forward to speaking with you again next year. I, as well as your care team,  appreciate your ongoing commitment to your health goals. Please review the following plan we discussed and let me know if I can assist you in the future. Your Game plan/ To Do List    Referrals: NONE No referrals were placed today. However you will be due for a colonoscopy in September of this year. The last colonoscopy was completed by Pietro Bridegroom, MD 11/29/2013 and they recommended you repeat it in 10 years. Discuss with Dr Mariam Shingles as he may need to place a referral order. Follow up Visits: Next Medicare AWV with our clinical staff: schedule this appointment at check out after your physical   Have you seen your provider in the last 6 months (3 months if uncontrolled diabetes)? No Next Office Visit with your provider: 08/11/2023 at 9:30 am. LABS scheduled 08/04/2023 at 8:45 am  Clinician Recommendations:  Aim for 30 minutes of exercise or brisk walking, 6-8 glasses of water, and 5 servings of fruits and vegetables each day. Try to exercise more regularly!       This is a list of the screening recommended for you and due dates:  Health Maintenance  Topic Date Due   COVID-19 Vaccine (8 - 2024-25 season) 02/03/2023   Flu Shot  10/06/2023   Colon Cancer Screening  11/30/2023   Medicare Annual Wellness Visit  07/24/2024   DTaP/Tdap/Td vaccine (4 - Td or Tdap) 06/26/2029   Pneumonia Vaccine  Completed   Hepatitis C Screening  Completed   Zoster (Shingles) Vaccine  Completed   HPV Vaccine  Aged Out   Meningitis B Vaccine  Aged Out    Advanced directives: (Copy Requested) Please bring a copy of your health care power of attorney and living will to the office to be added to your chart at your convenience. You can mail to [facility] [address] [place], [place] [address] or email to [email or web address] Advance Care Planning is important because it:  [x]  Makes sure you receive the medical care that is consistent with your values, goals, and preferences  [x]  It provides guidance to your family and loved ones and reduces their decisional burden about whether or not they are making the right decisions based on your wishes.  Follow the link provided in your after visit summary or read over the paperwork we have mailed to you to help you started getting your Advance Directives in place. If you need assistance in completing these, please reach out to us  so that we can help you!  See attachments for Preventive Care

## 2023-08-04 ENCOUNTER — Other Ambulatory Visit

## 2023-08-04 ENCOUNTER — Other Ambulatory Visit: Payer: Medicare PPO

## 2023-08-04 DIAGNOSIS — R3914 Feeling of incomplete bladder emptying: Secondary | ICD-10-CM | POA: Diagnosis not present

## 2023-08-04 DIAGNOSIS — E785 Hyperlipidemia, unspecified: Secondary | ICD-10-CM | POA: Diagnosis not present

## 2023-08-04 DIAGNOSIS — N401 Enlarged prostate with lower urinary tract symptoms: Secondary | ICD-10-CM

## 2023-08-04 DIAGNOSIS — E538 Deficiency of other specified B group vitamins: Secondary | ICD-10-CM

## 2023-08-04 LAB — COMPREHENSIVE METABOLIC PANEL WITH GFR
ALT: 17 U/L (ref 0–53)
AST: 13 U/L (ref 0–37)
Albumin: 4.3 g/dL (ref 3.5–5.2)
Alkaline Phosphatase: 45 U/L (ref 39–117)
BUN: 16 mg/dL (ref 6–23)
CO2: 28 meq/L (ref 19–32)
Calcium: 8.7 mg/dL (ref 8.4–10.5)
Chloride: 103 meq/L (ref 96–112)
Creatinine, Ser: 1.05 mg/dL (ref 0.40–1.50)
GFR: 71.59 mL/min (ref >60.00)
Glucose, Bld: 101 mg/dL — ABNORMAL HIGH (ref 70–99)
Potassium: 4.2 meq/L (ref 3.5–5.1)
Sodium: 138 meq/L (ref 135–145)
Total Bilirubin: 0.6 mg/dL (ref 0.2–1.2)
Total Protein: 6.4 g/dL (ref 6.0–8.3)

## 2023-08-04 LAB — CBC WITH DIFFERENTIAL/PLATELET
Basophils Absolute: 0 10*3/uL (ref 0.0–0.1)
Basophils Relative: 0.8 % (ref 0.0–3.0)
Eosinophils Absolute: 0.2 10*3/uL (ref 0.0–0.7)
Eosinophils Relative: 4.1 % (ref 0.0–5.0)
HCT: 43.6 % (ref 39.0–52.0)
Hemoglobin: 14.4 g/dL (ref 13.0–17.0)
Lymphocytes Relative: 30.9 % (ref 12.0–46.0)
Lymphs Abs: 1.4 10*3/uL (ref 0.7–4.0)
MCHC: 33.2 g/dL (ref 30.0–36.0)
MCV: 78.6 fl (ref 78.0–100.0)
Monocytes Absolute: 0.5 10*3/uL (ref 0.1–1.0)
Monocytes Relative: 11.7 % (ref 3.0–12.0)
Neutro Abs: 2.4 10*3/uL (ref 1.4–7.7)
Neutrophils Relative %: 52.5 % (ref 43.0–77.0)
Platelets: 253 10*3/uL (ref 150.0–400.0)
RBC: 5.54 Mil/uL (ref 4.22–5.81)
RDW: 13.6 % (ref 11.5–15.5)
WBC: 4.6 10*3/uL (ref 4.0–10.5)

## 2023-08-04 LAB — LIPID PANEL
Cholesterol: 134 mg/dL (ref 0–200)
HDL: 34.5 mg/dL — ABNORMAL LOW (ref >39.00)
LDL Cholesterol: 88 mg/dL (ref 0–99)
NonHDL: 99.84
Total CHOL/HDL Ratio: 4
Triglycerides: 61 mg/dL (ref 0.0–149.0)
VLDL: 12.2 mg/dL (ref 0.0–40.0)

## 2023-08-04 LAB — PSA: PSA: 1.43 ng/mL (ref 0.10–4.00)

## 2023-08-04 LAB — VITAMIN B12: Vitamin B-12: 421 pg/mL (ref 211–911)

## 2023-08-09 ENCOUNTER — Ambulatory Visit: Payer: Self-pay | Admitting: Family Medicine

## 2023-08-11 ENCOUNTER — Ambulatory Visit (INDEPENDENT_AMBULATORY_CARE_PROVIDER_SITE_OTHER): Payer: Medicare PPO | Admitting: Family Medicine

## 2023-08-11 ENCOUNTER — Encounter: Payer: Self-pay | Admitting: Family Medicine

## 2023-08-11 VITALS — BP 134/72 | HR 62 | Temp 98.2°F | Ht 69.25 in | Wt 204.1 lb

## 2023-08-11 DIAGNOSIS — E785 Hyperlipidemia, unspecified: Secondary | ICD-10-CM | POA: Diagnosis not present

## 2023-08-11 DIAGNOSIS — Z7189 Other specified counseling: Secondary | ICD-10-CM

## 2023-08-11 DIAGNOSIS — Z0000 Encounter for general adult medical examination without abnormal findings: Secondary | ICD-10-CM | POA: Diagnosis not present

## 2023-08-11 DIAGNOSIS — F5104 Psychophysiologic insomnia: Secondary | ICD-10-CM | POA: Diagnosis not present

## 2023-08-11 DIAGNOSIS — E538 Deficiency of other specified B group vitamins: Secondary | ICD-10-CM

## 2023-08-11 DIAGNOSIS — N401 Enlarged prostate with lower urinary tract symptoms: Secondary | ICD-10-CM

## 2023-08-11 DIAGNOSIS — K219 Gastro-esophageal reflux disease without esophagitis: Secondary | ICD-10-CM

## 2023-08-11 DIAGNOSIS — Z91038 Other insect allergy status: Secondary | ICD-10-CM

## 2023-08-11 DIAGNOSIS — J4520 Mild intermittent asthma, uncomplicated: Secondary | ICD-10-CM

## 2023-08-11 DIAGNOSIS — R3914 Feeling of incomplete bladder emptying: Secondary | ICD-10-CM

## 2023-08-11 DIAGNOSIS — J302 Other seasonal allergic rhinitis: Secondary | ICD-10-CM

## 2023-08-11 DIAGNOSIS — J3089 Other allergic rhinitis: Secondary | ICD-10-CM

## 2023-08-11 MED ORDER — TRAZODONE HCL 50 MG PO TABS: 25.0000 mg | ORAL_TABLET | Freq: Every evening | ORAL | 6 refills | Status: DC | PRN

## 2023-08-11 NOTE — Assessment & Plan Note (Addendum)
 Chronic sleep maintenance insomnia. Reviewed stressors contributing to symptoms. Sleep hygiene checklist provided. Reviewed treatment options including melatonin, benadryl, L-theanine, medication.  Start trazodone 25-50mg  nightly PRN.  RTC 3 mo insomnia f/u.

## 2023-08-11 NOTE — Patient Instructions (Addendum)
 Start trazodone 50mg  1/2-1 tablet at night as needed for sleep.  Consider colon cancer options colonoscopy vs Cologuard - let me know in September which you prefer and I will order.  Bring me copy of advanced directives.  Return in 3 months for follow up visit on sleep. May send me message through mychart.   Bedtime routine checklist: 1. Avoid naps during the day 2. Avoid stimulants such as caffeine  and nicotine. Avoid bedtime alcohol (it can speed onset of sleep but the body's metabolism can cause awakenings). 3. All forms of exercise help ensure sound sleep - limit vigorous exercise to morning or late afternoon 4. Avoid food too close to bedtime including chocolate (which contains caffeine ) 5. Soak up natural light 6. Establish regular bedtime routine. 7. Associate bed with sleep - avoid TV, computer or phone, reading while in bed. 8. Ensure pleasant, relaxing sleep environment - quiet, dark, cool room.

## 2023-08-11 NOTE — Assessment & Plan Note (Signed)
 Mild reaction in past, no anaphylaxis.

## 2023-08-11 NOTE — Assessment & Plan Note (Signed)
 Now off b12 replacement

## 2023-08-11 NOTE — Progress Notes (Signed)
 Ph: [phone] Fax: [phone]   Patient ID: Terrence Middleton, male    DOB: Nov 27, 1950, 73 y.o.   MRN: [id number]  This visit was conducted in person.  BP 134/72   Pulse 62   Temp 98.2 F (36.8 C) (Oral)   Ht 5June 15, 2025" (1.759 m)   Wt 204 lb 2 oz (92.6 kg)   SpO2 95%   BMI 29.93 kg/m    CC: CPE Subjective:   HPI: Terrence Middleton is a 73 y.o. male presenting on 08/11/2023 for Annual Exam (MCR prt 2 [AWV- 07/25/23]. 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 Note reviewed.   No results found.  Flowsheet Row Office Visit from 08/11/2023 in [facility] [place]  PHQ-2 Total Score 0          08/11/2023    9:37 AM 07/25/2023   11:02 AM 08/09/2022   10:19 AM 06/30/2021    3:10 PM 02/26/2020    4:04 PM  Fall Risk   Falls in the past year? 0 0 0 0 0  Number falls in past yr:  0  0   Injury with Fall?  0  0   Risk for fall due to :  No Fall Risks  No Fall Risks   Follow up  Falls evaluation completed  Falls prevention discussed     Post-COVID sequelae for 1+ months (COVID infection 11/2021).  Sees pulm Dr Linder Revere  for exercise induced asthma - continues Stiolto PRN with PRN rescue inhaler.   Sleep maintenance insomnia - waking up at 3am nightly for 3-4 months - trouble shutting mid off after he wakes up. Benadryl not too helpful - causes next am grogginess. Tried L-theanine amino acid supplement with some improvement. Melatonin wasn't too effective.  Endorses increased stress - wife diagnosed with parkinson's disease, MIL died 02/19/2023 in charge of her estate, DIL lost her job (deceased son).  + morning headaches, daytime somnolence. No snoring, witnessed apnea, PNdyspnea,   Known lumbar scoliosis, DDD with R radiculopathy, facet arthropathy. Established with neurosurgery Dr Jeris Montes at [facility] - back pain improved with ESI and PT course.    Preventative: COLONOSCOPY Date: 11/2013 WNL good for 10 yrs [place] [facility]). No bowel changes or blood in stool.  Prostate screening - yearly PSA. Nocturia x1, mild weakening stream.   Lung cancer screening - not eligible  Flu shot yearly COVID vaccine Pfizer 04/2019 x2, booster 12/2019, 10/2020, bivalent 03/2021, Feb 18, 2022, 12/2022 Td August 19, 2005. Tdap 2015-08-20, 06/2019 Pneumovax - 2015-02-19, prevnar-13 August 20, 2015 Prevnar-20 03/2022 zostavax - 11/2012 Shingrix - 02-19-2019, 09/2019 Advanced directives discussion: has all this at home. Wife is HCPOA. Asked to bring us  copy.  Sleep - 3-4 hours at night -see above Seat belt use discussed.  Sunscreen use discussed. No changing moles on skin. Sees Dr Rochelle Chu yearly (h/o [facility]) Non smoker. Previous chewing tobacco.  Alcohol - cut down to minimal Dentist q6 mo  Eye exam yearly  Bowel - no constipation  Bladder - no incontinence   Lives with wife, dog Manufacturing engineer).  Grown children. 33yo Terrence Middleton son passed away 08-20-14 (pulm fibrosis - Hermanske-Pudlike syndrome)  Occupation: retired - was Technical sales engineer at [employer]  Activity: no regular exercise. Does walk at work and stays active at home.  Diet: good water, fruits/vegetables daily     Relevant past medical, surgical, family and social history reviewed and updated  as indicated. Interim medical history since our last visit reviewed. Allergies and medications reviewed and updated. Outpatient Medications Prior to Visit  Medication Sig Dispense Refill   albuterol  (VENTOLIN  HFA) 108 (90 Base) MCG/ACT inhaler INHALE 2 PUFFS INTO THE LUNGS EVERY 4 HOURS AS NEEDED FOR WHEEZING OR SHORTNESS OF BREATH. 18 each 12   Cholecalciferol (VITAMIN D3) 25 MCG (1000 UT) CAPS Take 1 capsule by mouth daily.     EPINEPHrine  0.3 mg/0.3 mL IJ SOAJ injection Inject into thigh for severe allergic reaction 1 each 12   famotidine (PEPCID) 20 MG tablet Take 20 mg by mouth daily.     Sodium Chloride -Sodium Bicarb 1.57 g PACK Place into the nose. Uses  every morning     Tiotropium Bromide-Olodaterol (STIOLTO RESPIMAT ) 2.5-2.5 MCG/ACT AERS INHALE 2 PUFFS BY MOUTH INTO THE LUNGS DAILY 4 g 12   cyanocobalamin  (V-R VITAMIN B-12) 500 MCG tablet Take 2 tablets (1,000 mcg total) by mouth daily.     No facility-administered medications prior to visit.     Per HPI unless specifically indicated in ROS section below Review of Systems  Constitutional:  Negative for activity change, appetite change, chills, fatigue, fever and unexpected weight change.  HENT:  Negative for hearing loss.   Eyes:  Negative for visual disturbance.  Respiratory:  Negative for cough, chest tightness, shortness of breath and wheezing.   Cardiovascular:  Positive for leg swelling (bilat, dependent). Negative for chest pain and palpitations.  Gastrointestinal:  Negative for abdominal distention, abdominal pain, blood in stool, constipation, diarrhea, nausea and vomiting.  Genitourinary:  Negative for difficulty urinating and hematuria.  Musculoskeletal:  Negative for arthralgias, myalgias and neck pain.  Skin:  Negative for rash.  Neurological:  Positive for headaches. Negative for dizziness, seizures and syncope.  Hematological:  Negative for adenopathy. Does not bruise/bleed easily.  Psychiatric/Behavioral:  Positive for dysphoric mood. The patient is nervous/anxious.     Objective:  BP 134/72   Pulse 62   Temp 98.2 F (36.8 C) (Oral)   Ht 5' 9.25" (1.759 m)   Wt 204 lb 2 oz (92.6 kg)   SpO2 95%   BMI 29.93 kg/m   Wt Readings from Last 3 Encounters:  08/11/23 204 lb 2 oz (92.6 kg)  07/25/23 208 lb (94.3 kg)  03/20/23 216 lb 9.6 oz (98.2 kg)      Physical Exam Vitals and nursing note reviewed.  Constitutional:      General: He is not in acute distress.    Appearance: Normal appearance. He is well-developed. He is not ill-appearing.  HENT:     Head: Normocephalic and atraumatic.     Right Ear: Hearing, tympanic membrane, ear canal and external ear normal.      Left Ear: Hearing, tympanic membrane, ear canal and external ear normal.     Mouth/Throat:     Mouth: Mucous membranes are moist.     Pharynx: Oropharynx is clear. No oropharyngeal exudate or posterior oropharyngeal erythema.  Eyes:     General: No scleral icterus.    Extraocular Movements: Extraocular movements intact.     Conjunctiva/sclera: Conjunctivae normal.     Pupils: Pupils are equal, round, and reactive to light.  Neck:     Thyroid : No thyroid  mass or thyromegaly.     Vascular: No carotid bruit.  Cardiovascular:     Rate and Rhythm: Normal rate and regular rhythm.     Pulses: Normal pulses.          Radial pulses  are 2+ on the right side and 2+ on the left side.     Heart sounds: Normal heart sounds. No murmur heard. Pulmonary:     Effort: Pulmonary effort is normal. No respiratory distress.     Breath sounds: Normal breath sounds. No wheezing, rhonchi or rales.  Abdominal:     General: Bowel sounds are normal. There is no distension.     Palpations: Abdomen is soft. There is no mass.     Tenderness: There is no abdominal tenderness. There is no guarding or rebound.     Hernia: A hernia is present. Hernia is present in the umbilical area (chronic, nontender, reducible). There is no hernia in the left inguinal area or right inguinal area.  Musculoskeletal:        General: Normal range of motion.     Cervical back: Normal range of motion and neck supple.     Right lower leg: No edema.     Left lower leg: No edema.  Lymphadenopathy:     Cervical: No cervical adenopathy.  Skin:    General: Skin is warm and dry.     Findings: No rash.  Neurological:     General: No focal deficit present.     Mental Status: He is alert and oriented to person, place, and time.  Psychiatric:        Mood and Affect: Mood normal.        Behavior: Behavior normal.        Thought Content: Thought content normal.        Judgment: Judgment normal.       Results for orders placed or  performed in visit on 08/04/23  CBC with Differential/Platelet   Collection Time: 08/04/23  8:49 AM  Result Value Ref Range   WBC 4.6 4.0 - 10.5 K/uL   RBC 5.54 4.22 - 5.81 Mil/uL   Hemoglobin 14.4 13.0 - 17.0 g/dL   HCT 16.1 09.6 - 04.5 %   MCV 78.6 78.0 - 100.0 fl   MCHC 33.2 30.0 - 36.0 g/dL   RDW 40.9 81.1 - 91.4 %   Platelets 253.0 150.0 - 400.0 K/uL   Neutrophils Relative % 52.5 43.0 - 77.0 %   Lymphocytes Relative 30.9 12.0 - 46.0 %   Monocytes Relative 11.7 3.0 - 12.0 %   Eosinophils Relative 4.1 0.0 - 5.0 %   Basophils Relative 0.8 0.0 - 3.0 %   Neutro Abs 2.4 1.4 - 7.7 K/uL   Lymphs Abs 1.4 0.7 - 4.0 K/uL   Monocytes Absolute 0.5 0.1 - 1.0 K/uL   Eosinophils Absolute 0.2 0.0 - 0.7 K/uL   Basophils Absolute 0.0 0.0 - 0.1 K/uL  Vitamin B12   Collection Time: 08/04/23  8:49 AM  Result Value Ref Range   Vitamin B-12 421 211 - 911 pg/mL  PSA   Collection Time: 08/04/23  8:49 AM  Result Value Ref Range   PSA 1.43 0.10 - 4.00 ng/mL  Comprehensive metabolic panel with GFR   Collection Time: 08/04/23  8:49 AM  Result Value Ref Range   Sodium 138 135 - 145 mEq/L   Potassium 4.2 3.5 - 5.1 mEq/L   Chloride 103 96 - 112 mEq/L   CO2 28 19 - 32 mEq/L   Glucose, Bld 101 (H) 70 - 99 mg/dL   BUN 16 6 - 23 mg/dL   Creatinine, Ser 7.82 0.40 - 1.50 mg/dL   Total Bilirubin 0.6 0.2 - 1.2 mg/dL   Alkaline Phosphatase  45 39 - 117 U/L   AST 13 0 - 37 U/L   ALT 17 0 - 53 U/L   Total Protein 6.4 6.0 - 8.3 g/dL   Albumin 4.3 3.5 - 5.2 g/dL   GFR 14.78 >29.56 mL/min   Calcium 8.7 8.4 - 10.5 mg/dL  Lipid panel   Collection Time: 08/04/23  8:49 AM  Result Value Ref Range   Cholesterol 134 0 - 200 mg/dL   Triglycerides 21.3 0.0 - 149.0 mg/dL   HDL 08.65 (L) >78.46 mg/dL   VLDL 96.2 0.0 - 95.2 mg/dL   LDL Cholesterol 88 0 - 99 mg/dL   Total CHOL/HDL Ratio 4    NonHDL 99.84       08/11/2023    9:38 AM 07/25/2023   10:59 AM 08/09/2022   10:19 AM 06/30/2021    3:02 PM 02/26/2020     4:05 PM  Depression screen PHQ 2/9  Decreased Interest 0 0 0 0 0  Down, Depressed, Hopeless 0 0 0 0 0  PHQ - 2 Score 0 0 0 0 0  Altered sleeping 3 3 1     Tired, decreased energy 2 1 1     Change in appetite 0 0 0    Feeling bad or failure about yourself  0 0 0    Trouble concentrating 1 0 0    Moving slowly or fidgety/restless 0 0 0    Suicidal thoughts 0 0 0    PHQ-9 Score 6 4 2     Difficult doing work/chores Somewhat difficult Very difficult Not difficult at all         08/11/2023    9:38 AM 08/09/2022   10:19 AM  GAD 7 : Generalized Anxiety Score  Nervous, Anxious, on Edge 1 0  Control/stop worrying 1 1  Worry too much - different things 1 0  Trouble relaxing 1 0  Restless 0 0  Easily annoyed or irritable 1 0  Afraid - awful might happen 0 0  Total GAD 7 Score 5 1  Anxiety Difficulty Somewhat difficult Not difficult at all   Assessment & Plan:   Problem List Items Addressed This Visit     Health maintenance examination - Primary (Chronic)   Preventative protocols reviewed and updated unless pt declined. Discussed healthy diet and lifestyle.       Advanced directives, counseling/discussion (Chronic)   Asked to bring us  copy.       Seasonal and perennial allergic rhinitis   GERD   Stable period on pepcid.       Allergy  to insect stings   Mild reaction in past, no anaphylaxis.       Dyslipidemia   Chronic, off medication. Discussed ASCVD risk and cardiac CT with calcium score - will order this.  Fmhx CAD.  The 10-year ASCVD risk score (Arnett DK, et al., 2019) is: 20.2%   Values used to calculate the score:     Age: 15 years     Sex: Male     Is Non-Hispanic African American: No     Diabetic: No     Tobacco smoker: No     Systolic Blood Pressure: 134 mmHg     Is BP treated: No     HDL Cholesterol: 34.5 mg/dL     Total Cholesterol: 134 mg/dL       Relevant Orders   CT CARDIAC SCORING (SELF PAY ONLY)   Low serum vitamin B12   Now off b12 replacement  BPH (benign prostatic hyperplasia)   PSA stable.       Allergic asthma, mild intermittent, uncomplicated   H/o exercise induced asthma , well managed - appreciate pulm care.       Insomnia   Chronic sleep maintenance insomnia. Reviewed stressors contributing to symptoms. Sleep hygiene checklist provided. Reviewed treatment options including melatonin, benadryl, L-theanine, medication.  Start trazodone 25-50mg  nightly PRN.  RTC 3 mo insomnia f/u.         Meds ordered this encounter  Medications   traZODone (DESYREL) 50 MG tablet    Sig: Take 0.5-1 tablets (25-50 mg total) by mouth at bedtime as needed for sleep.    Dispense:  30 tablet    Refill:  6    Orders Placed This Encounter  Procedures   CT CARDIAC SCORING (SELF PAY ONLY)    Standing Status:   Future    Expiration Date:   08/10/2024    Preferred imaging location?:   [facility]    Patient Instructions  Start trazodone 50mg  1/2-1 tablet at night as needed for sleep.  Consider colon cancer options colonoscopy vs Cologuard - let me know in September which you prefer and I will order.  Bring me copy of advanced directives.  Return in 3 months for follow up visit on sleep. May send me message through mychart.   Bedtime routine checklist: 1. Avoid naps during the day 2. Avoid stimulants such as caffeine  and nicotine. Avoid bedtime alcohol (it can speed onset of sleep but the body's metabolism can cause awakenings). 3. All forms of exercise help ensure sound sleep - limit vigorous exercise to morning or late afternoon 4. Avoid food too close to bedtime including chocolate (which contains caffeine ) 5. Soak up natural light 6. Establish regular bedtime routine. 7. Associate bed with sleep - avoid TV, computer or phone, reading while in bed. 8. Ensure pleasant, relaxing sleep environment - quiet, dark, cool room.   Follow up plan: Return in about 3 months (around 11/11/2023) for follow up visit.  Claire Crick, MD

## 2023-08-11 NOTE — Assessment & Plan Note (Addendum)
 H/o exercise induced asthma , well managed - appreciate pulm care.

## 2023-08-11 NOTE — Assessment & Plan Note (Signed)
 Chronic, off medication. Discussed ASCVD risk and cardiac CT with calcium score - will order this.  Fmhx CAD.  The 10-year ASCVD risk score (Arnett DK, et al., 2019) is: 20.2%   Values used to calculate the score:     Age: 73 years     Sex: Male     Is Non-Hispanic African American: No     Diabetic: No     Tobacco smoker: No     Systolic Blood Pressure: 134 mmHg     Is BP treated: No     HDL Cholesterol: 34.5 mg/dL     Total Cholesterol: 134 mg/dL

## 2023-08-11 NOTE — Assessment & Plan Note (Signed)
 Preventative protocols reviewed and updated unless pt declined. Discussed healthy diet and lifestyle.

## 2023-08-11 NOTE — Assessment & Plan Note (Signed)
Asked to bring us copy.  

## 2023-08-11 NOTE — Assessment & Plan Note (Signed)
 PSA stable

## 2023-08-11 NOTE — Assessment & Plan Note (Signed)
 Stable period on pepcid.

## 2023-08-14 ENCOUNTER — Encounter: Payer: Self-pay | Admitting: *Deleted

## 2023-08-28 ENCOUNTER — Ambulatory Visit
Admission: RE | Admit: 2023-08-28 | Discharge: 2023-08-28 | Disposition: A | Discharge status: Home / Self Care | Payer: Self-pay | Source: Ambulatory Visit | Attending: Family Medicine | Admitting: Family Medicine

## 2023-08-28 DIAGNOSIS — E785 Hyperlipidemia, unspecified: Secondary | ICD-10-CM | POA: Insufficient documentation

## 2023-09-01 ENCOUNTER — Ambulatory Visit: Payer: Self-pay | Admitting: Family Medicine

## 2023-09-01 DIAGNOSIS — E785 Hyperlipidemia, unspecified: Secondary | ICD-10-CM

## 2023-09-03 ENCOUNTER — Other Ambulatory Visit: Payer: Self-pay | Admitting: Family Medicine

## 2023-09-07 ENCOUNTER — Ambulatory Visit: Payer: Self-pay

## 2023-09-07 ENCOUNTER — Encounter: Payer: Self-pay | Admitting: Family Medicine

## 2023-09-07 ENCOUNTER — Ambulatory Visit: Admitting: Family Medicine

## 2023-09-07 VITALS — BP 110/70 | HR 65 | Temp 97.8°F | Ht 69.25 in | Wt 200.2 lb

## 2023-09-07 DIAGNOSIS — J4521 Mild intermittent asthma with (acute) exacerbation: Secondary | ICD-10-CM

## 2023-09-07 MED ORDER — PREDNISONE 20 MG PO TABS: ORAL_TABLET | ORAL | 0 refills | Status: DC

## 2023-09-07 MED ORDER — AZITHROMYCIN 250 MG PO TABS: ORAL_TABLET | ORAL | 0 refills | Status: DC

## 2023-09-07 NOTE — Telephone Encounter (Signed)
 FYI Only or Action Required?: Action required by provider: request for appointment.  Patient was last seen in primary care on 08/11/2023 by Rilla Baller, MD. Called Nurse Triage reporting Cough. Symptoms began several weeks ago. Interventions attempted: OTC medications: Robitussin. Symptoms are: unchanged.  Triage Disposition: See HCP Within 4 Hours (Or PCP Triage)  Patient/caregiver understands and will follow disposition?: YesCopied from CRM [id number]. Topic: Clinical - Red Word Triage >> Sep 07, 2023  8:04 AM Suzen RAMAN wrote: Red Word that prompted transfer to Nurse Triage: Bronchitis symptoms Reason for Disposition  [1] MILD difficulty breathing (e.g., minimal/no SOB at rest, SOB with walking, pulse <100) AND [2] still present when not coughing  Answer Assessment - Initial Assessment Questions 1. ONSET: When did the cough begin?      2 weeks ago   3. SPUTUM: Describe the color of your sputum (none, dry cough; clear, white, yellow, green)     clear 4. HEMOPTYSIS: Are you coughing up any blood? If so ask: How much? (flecks, streaks, tablespoons, etc.)     Na  5. DIFFICULTY BREATHING: Are you having difficulty breathing? If Yes, ask: How bad is it? (e.g., mild, moderate, severe)    - MILD: No SOB at rest, mild SOB with walking, speaks normally in sentences, can lie down, no retractions, pulse < 100.    - MODERATE: SOB at rest, SOB with minimal exertion and prefers to sit, cannot lie down flat, speaks in phrases, mild retractions, audible wheezing, pulse 100-120.    - SEVERE: Very SOB at rest, speaks in single words, struggling to breathe, sitting hunched forward, retractions, pulse > 120      mild 6. FEVER: Do you have a fever? If Yes, ask: What is your temperature, how was it measured, and when did it start?     Denies  7. CARDIAC HISTORY: Do you have any history of heart disease? (e.g., heart attack, congestive heart failure)      Na  8. LUNG HISTORY: Do you have  any history of lung disease?  (e.g., pulmonary embolus, asthma, emphysema)     Asthma  10. OTHER SYMPTOMS: Do you have any other symptoms? (e.g., runny nose, wheezing, chest pain)      Headache, wheezing, body aches, fatigue     Pt is taking Robitussin and gets relief to sleep. Pt has had  bronchitis several times  and feels this is another bout of it.  Protocols used: Cough - Acute Productive-A-AH

## 2023-09-07 NOTE — Assessment & Plan Note (Signed)
 Acute, given greater than 7 to 10 days of illness with productive cough concerning for initial viral infection now with possible bacterial infection. Patient with wheeze on exam. Encouraged him to start prednisone  taper for asthma exacerbation.  If not improving in 24 to 48 hours he will start antibiotic prescription for azithromycin  500 x 1 day, 250 mg x 4 days. He will continue Stiolto 2 puffs daily and use albuterol  as needed for coughing fits and wheeze.  Return and ER precautions provided if.  If severe shortness of breath go to the emergency room.

## 2023-09-07 NOTE — Telephone Encounter (Signed)
 Pt seen by Dr Avelina earlier today.

## 2023-09-07 NOTE — Telephone Encounter (Signed)
 Attempted to contact pt. No answer, no vm. Pt needs OV.   Sending pt a message via MyChart.

## 2023-09-07 NOTE — Progress Notes (Signed)
 Patient ID: Terrence Middleton, male    DOB: November 05, 1950, 73 y.o.   MRN: [id number]  This visit was conducted in person.  BP 110/70   Pulse 65   Temp 97.8 F (36.6 C) (Temporal)   Ht 5' 9.25 (1.759 m)   Wt 200 lb 4 oz (90.8 kg)   SpO2 97%   BMI 29.36 kg/m    CC:  Chief Complaint  Patient presents with   Cough    X 12 days-Productive-Taking Mucinex 12 hr   Fever    X 1 night   Nasal Congestion    Subjective:   HPI: Terrence Middleton is a 73 y.o. male patient of Dr. Rilla presenting on 09/07/2023 for Cough (X 12 days-Productive-Taking Mucinex 12 hr), Fever (X 1 night), and Nasal Congestion   Date of onset: 12 days Initial symptoms included headache, ST,  nasal congestion and fever x 1 night, cough. Symptoms progressed to productive cough. Wheeze at night. Mild SOB.  No recent fever.   Sick contacts:  started after scout camp with granddaughter COVID testing:    negative test on day 2 of illness.     He has tried to treat with Mucinex twice daily. He takes Stiolto 2 puffs daily and uses albuterol  as needed ( has not had to use it yet.)   He has history of chronic lung disease : Mild intermittent allergic asthma, Dr. Neysa He has a history of allergies Non-smoker.       Relevant past medical, surgical, family and social history reviewed and updated as indicated. Interim medical history since our last visit reviewed. Allergies and medications reviewed and updated. Outpatient Medications Prior to Visit  Medication Sig Dispense Refill   albuterol  (VENTOLIN  HFA) 108 (90 Base) MCG/ACT inhaler INHALE 2 PUFFS INTO THE LUNGS EVERY 4 HOURS AS NEEDED FOR WHEEZING OR SHORTNESS OF BREATH. 18 each 12   ascorbic acid (VITAMIN C) 500 MG tablet Take 500 mg by mouth daily.     EPINEPHrine  0.3 mg/0.3 mL IJ SOAJ injection Inject into thigh for severe allergic reaction 1 each 12   famotidine (PEPCID) 20 MG tablet Take 20 mg by mouth daily.     Sodium Chloride -Sodium Bicarb 1.57 g  PACK Place into the nose. Uses every morning     Tiotropium Bromide-Olodaterol (STIOLTO RESPIMAT ) 2.5-2.5 MCG/ACT AERS INHALE 2 PUFFS BY MOUTH INTO THE LUNGS DAILY 4 g 12   traZODone  (DESYREL ) 50 MG tablet TAKE 0.5-1 TABLETS BY MOUTH AT BEDTIME AS NEEDED FOR SLEEP. 90 tablet 3   Cholecalciferol (VITAMIN D3) 25 MCG (1000 UT) CAPS Take 1 capsule by mouth daily.     No facility-administered medications prior to visit.     Per HPI unless specifically indicated in ROS section below Review of Systems  Constitutional:  Positive for fever. Negative for fatigue.  HENT:  Positive for sore throat. Negative for ear pain and sinus pressure.   Eyes:  Negative for pain.  Respiratory:  Positive for cough. Negative for shortness of breath.   Cardiovascular:  Negative for chest pain, palpitations and leg swelling.  Gastrointestinal:  Negative for abdominal pain.  Genitourinary:  Negative for dysuria.  Musculoskeletal:  Negative for arthralgias.  Neurological:  Negative for syncope, light-headedness and headaches.  Psychiatric/Behavioral:  Negative for dysphoric mood.    Objective:  BP 110/70   Pulse 65   Temp 97.8 F (36.6 C) (Temporal)   Ht 5' 9.25 (1.759 m)   Wt 200 lb 4 oz (90.8  kg)   SpO2 97%   BMI 29.36 kg/m   Wt Readings from Last 3 Encounters:  09/07/23 200 lb 4 oz (90.8 kg)  08/11/23 204 lb 2 oz (92.6 kg)  07/25/23 208 lb (94.3 kg)      Physical Exam Vitals reviewed.  Constitutional:      Appearance: He is well-developed.  HENT:     Head: Normocephalic.     Right Ear: Hearing normal.     Left Ear: Hearing normal.     Nose: Nose normal.  Neck:     Thyroid : No thyroid  mass or thyromegaly.     Vascular: No carotid bruit.     Trachea: Trachea normal.  Cardiovascular:     Rate and Rhythm: Normal rate and regular rhythm.     Pulses: Normal pulses.     Heart sounds: Heart sounds not distant. No murmur heard.    No friction rub. No gallop.     Comments: No peripheral  edema Pulmonary:     Effort: Pulmonary effort is normal. No respiratory distress.     Breath sounds: No decreased air movement. Wheezing present. No decreased breath sounds, rhonchi or rales.  Skin:    General: Skin is warm and dry.     Findings: No rash.  Psychiatric:        Speech: Speech normal.        Behavior: Behavior normal.        Thought Content: Thought content normal.       Results for orders placed or performed in visit on 08/04/23  CBC with Differential/Platelet   Collection Time: 08/04/23  8:49 AM  Result Value Ref Range   WBC 4.6 4.0 - 10.5 K/uL   RBC 5.54 4.22 - 5.81 Mil/uL   Hemoglobin 14.4 13.0 - 17.0 g/dL   HCT 56.3 60.9 - 47.9 %   MCV 78.6 78.0 - 100.0 fl   MCHC 33.2 30.0 - 36.0 g/dL   RDW 86.3 88.4 - 84.4 %   Platelets 253.0 150.0 - 400.0 K/uL   Neutrophils Relative % 52.5 43.0 - 77.0 %   Lymphocytes Relative 30.9 12.0 - 46.0 %   Monocytes Relative 11.7 3.0 - 12.0 %   Eosinophils Relative 4.1 0.0 - 5.0 %   Basophils Relative 0.8 0.0 - 3.0 %   Neutro Abs 2.4 1.4 - 7.7 K/uL   Lymphs Abs 1.4 0.7 - 4.0 K/uL   Monocytes Absolute 0.5 0.1 - 1.0 K/uL   Eosinophils Absolute 0.2 0.0 - 0.7 K/uL   Basophils Absolute 0.0 0.0 - 0.1 K/uL  Vitamin B12   Collection Time: 08/04/23  8:49 AM  Result Value Ref Range   Vitamin B-12 421 211 - 911 pg/mL  PSA   Collection Time: 08/04/23  8:49 AM  Result Value Ref Range   PSA 1.43 0.10 - 4.00 ng/mL  Comprehensive metabolic panel with GFR   Collection Time: 08/04/23  8:49 AM  Result Value Ref Range   Sodium 138 135 - 145 mEq/L   Potassium 4.2 3.5 - 5.1 mEq/L   Chloride 103 96 - 112 mEq/L   CO2 28 19 - 32 mEq/L   Glucose, Bld 101 (H) 70 - 99 mg/dL   BUN 16 6 - 23 mg/dL   Creatinine, Ser 8.94 0.40 - 1.50 mg/dL   Total Bilirubin 0.6 0.2 - 1.2 mg/dL   Alkaline Phosphatase 45 39 - 117 U/L   AST 13 0 - 37 U/L   ALT 17 0 - 53  U/L   Total Protein 6.4 6.0 - 8.3 g/dL   Albumin 4.3 3.5 - 5.2 g/dL   GFR 28.40 >39.99 mL/min    Calcium 8.7 8.4 - 10.5 mg/dL  Lipid panel   Collection Time: 08/04/23  8:49 AM  Result Value Ref Range   Cholesterol 134 0 - 200 mg/dL   Triglycerides 38.9 0.0 - 149.0 mg/dL   HDL 65.49 (L) >60.99 mg/dL   VLDL 87.7 0.0 - 59.9 mg/dL   LDL Cholesterol 88 0 - 99 mg/dL   Total CHOL/HDL Ratio 4    NonHDL 99.84     Assessment and Plan  Mild intermittent asthma with exacerbation Assessment & Plan: Acute, given greater than 7 to 10 days of illness with productive cough concerning for initial viral infection now with possible bacterial infection. Patient with wheeze on exam. Encouraged him to start prednisone  taper for asthma exacerbation.  If not improving in 24 to 48 hours he will start antibiotic prescription for azithromycin  500 x 1 day, 250 mg x 4 days. He will continue Stiolto 2 puffs daily and use albuterol  as needed for coughing fits and wheeze.  Return and ER precautions provided if.  If severe shortness of breath go to the emergency room.   Other orders -     predniSONE ; 3 tabs by mouth daily x 3 days, then 2 tabs by mouth daily x 2 days then 1 tab by mouth daily x 2 days  Dispense: 15 tablet; Refill: 0 -     Azithromycin ; 2 tab po x 1 day then 1 tab po daily  Dispense: 6 tablet; Refill: 0    No follow-ups on file.   Greig Ring, MD

## 2023-09-11 ENCOUNTER — Ambulatory Visit: Payer: Self-pay

## 2023-09-11 ENCOUNTER — Other Ambulatory Visit: Payer: Self-pay | Admitting: Family Medicine

## 2023-09-11 MED ORDER — PREDNISONE 10 MG PO TABS: ORAL_TABLET | ORAL | 0 refills | Status: AC

## 2023-09-11 NOTE — Telephone Encounter (Deleted)
 Saturday took 3 and then took 2 more by accident.With this, patient is now short on medication.  Will no have any medication for Tuesday and Wednesday  Cell: [phone]

## 2023-09-11 NOTE — Telephone Encounter (Signed)
 Routed to Provider pool and triage

## 2023-09-11 NOTE — Telephone Encounter (Addendum)
 Replied via phone note but pt was not contacted so I sent him a Clinical cytogeneticist message.

## 2023-09-11 NOTE — Telephone Encounter (Signed)
 Sent note to Dr Rilla lindwood Rilla pool.

## 2023-09-11 NOTE — Telephone Encounter (Signed)
 FYI Only or Action Required?: Action required by provider: medication refill request and clinical question for provider.  Patient was last seen in primary care on 09/07/2023 by Avelina Greig BRAVO, MD. Called Nurse Triage reporting Advice Only. Symptoms began Saturday. Interventions attempted: Prescription medications: prednisone  and Abx. Symptoms are: gradually improving.  Triage Disposition: No disposition on file.  Patient/caregiver understands and will follow disposition?:     Copied from CRM [id number]. Topic: Clinical - Red Word Triage >> Sep 11, 2023  9:57 AM [name] wrote: Reason for CRM: patient called stating he took more than was prescribed on saturday accidentally for his predniSONE  (DELTASONE ) 20 MG tablet and will be short two pills and he was told to call the office by the pharmacy. Patient does not have any symptoms   Pt no longer on the phone/call disconnected. Called patient back and was able to gather information.   Reason for Disposition  Requesting regular office appointment    Routing information to PCP office  Answer Assessment - Initial Assessment Questions 1. REASON FOR CALL or QUESTION: What is your reason for calling today? or How can I best help you? or What question do you have that I can help answer?     Patient called stating on Saturday took 3 and then took 2 more by accident.With this, patient is now short on medication for days. Pt should originally had 1 pill for Tuesday and Wednesday.  Please call patient to inform is he needs to be ordered more medication to complete the series or if something else needs to be done.  Please call patient on Cell: [phone]  Protocols used: Information Only Call - No Triage-A-AH

## 2023-09-11 NOTE — Progress Notes (Signed)
 See triage message. Prednisone  20mg  taper ERx.

## 2023-09-11 NOTE — Telephone Encounter (Signed)
 So he took 100mg  prednisone  on Saturday and will be out for tues and wed 20mg  doses.  If he's feeling great with continued improvement, likely doesn't need any more prednisone  steroid.  If ongoing wheezing/asthma flare symptoms then would extend prednisone  taper which I have sent to his pharmacy - 20mg  x2d then 10mg  x2d.

## 2023-09-12 NOTE — Telephone Encounter (Signed)
Lvm asking pt to call back.  Need to relay Dr. G's message.  

## 2023-09-13 NOTE — Telephone Encounter (Signed)
 Lvm asking pt to call back. Need to relay Dr Talmadge message.   Also, sent message via MyChart.

## 2023-09-13 NOTE — Telephone Encounter (Signed)
 Last read by [name] at 9:39AM on 09/13/2023   No further action needed

## 2023-11-03 LAB — FECAL OCCULT BLOOD, IMMUNOCHEMICAL: IFOBT: NEGATIVE

## 2023-11-13 ENCOUNTER — Ambulatory Visit: Admitting: Family Medicine

## 2023-11-13 ENCOUNTER — Encounter: Payer: Self-pay | Admitting: Family Medicine

## 2023-11-13 VITALS — BP 138/70 | HR 63 | Temp 97.5°F | Ht 69.25 in | Wt 202.0 lb

## 2023-11-13 DIAGNOSIS — R400 Somnolence: Secondary | ICD-10-CM | POA: Diagnosis not present

## 2023-11-13 DIAGNOSIS — E785 Hyperlipidemia, unspecified: Secondary | ICD-10-CM

## 2023-11-13 DIAGNOSIS — F5104 Psychophysiologic insomnia: Secondary | ICD-10-CM | POA: Diagnosis not present

## 2023-11-13 DIAGNOSIS — Z23 Encounter for immunization: Secondary | ICD-10-CM | POA: Diagnosis not present

## 2023-11-13 DIAGNOSIS — Z1211 Encounter for screening for malignant neoplasm of colon: Secondary | ICD-10-CM | POA: Diagnosis not present

## 2023-11-13 NOTE — Progress Notes (Unsigned)
 Ph: [phone] Fax: [phone]   Patient ID: Terrence Middleton, male    DOB: [birth year], 73 y.o.   MRN: [id number]  This visit was conducted in person.  BP 138/70   Pulse 63   Temp (!) 97.5 F (36.4 C) (Oral)   Ht 5' 9.25 (1.759 m)   Wt 202 lb (91.6 kg)   SpO2 99%   BMI 29.62 kg/m    CC: 3 mo insomnia f/u visit  Subjective:   HPI: Terrence Middleton is a 73 y.o. male presenting on 11/13/2023 for Insomnia (P/o here for 85mo F/u for insomnia)   See prior note for details.  Recent cardiac CT reassuring with CCS = zero (08/2023).  Last visit we discussed sleep maintenance insomnia - benadryl and melatonin not too helpful, L-theanine was somewhat helpful. This in setting of increased stressors.  Bedtime 10:30pm Sleep latency - 10 min Wakes up at 3-4 am, unable to fall back asleep.  Has tried melatonin 2.5-3 mg tablets nightly - some am grogginess.  Last month he tried trazodone  25mg  at night x3 nights - very cloudy the next morning. Had painful erection as well next am.   Feels loud heart beat in abdomen, worse in evenings. No fmhx AAA.   He also endorsed morning headaches and daytime somnolence. Non restorative sleep. Doesn't take naps. Not significant snorer, no witnessed apnea.  ESS = 2  Sleep difficulty worsened over the past 6 months.   No significant alcohol, caffeine  at bedtime, not eating close to bedtime.  Good bedtime routine.      Relevant past medical, surgical, family and social history reviewed and updated as indicated. Interim medical history since our last visit reviewed. Allergies and medications reviewed and updated. Outpatient Medications Prior to Visit  Medication Sig Dispense Refill   albuterol  (VENTOLIN  HFA) 108 (90 Base) MCG/ACT inhaler INHALE 2 PUFFS INTO THE LUNGS EVERY 4 HOURS AS NEEDED FOR WHEEZING OR SHORTNESS OF BREATH. 18 each 12   ascorbic acid (VITAMIN C) 500 MG tablet Take 500 mg by mouth daily.     EPINEPHrine  0.3 mg/0.3 mL IJ SOAJ  injection Inject into thigh for severe allergic reaction 1 each 12   famotidine (PEPCID) 20 MG tablet Take 20 mg by mouth daily.     Sodium Chloride -Sodium Bicarb 1.57 g PACK Place into the nose. Uses every morning     Tiotropium Bromide-Olodaterol (STIOLTO RESPIMAT ) 2.5-2.5 MCG/ACT AERS INHALE 2 PUFFS BY MOUTH INTO THE LUNGS DAILY 4 g 12   azithromycin  (ZITHROMAX ) 250 MG tablet 2 tab po x 1 day then 1 tab po daily (Patient not taking: Reported on 11/13/2023) 6 tablet 0   predniSONE  (DELTASONE ) 20 MG tablet 3 tabs by mouth daily x 3 days, then 2 tabs by mouth daily x 2 days then 1 tab by mouth daily x 2 days (Patient not taking: Reported on 11/13/2023) 15 tablet 0   traZODone  (DESYREL ) 50 MG tablet TAKE 0.5-1 TABLETS BY MOUTH AT BEDTIME AS NEEDED FOR SLEEP. (Patient not taking: Reported on 11/13/2023) 90 tablet 3   No facility-administered medications prior to visit.     Per HPI unless specifically indicated in ROS section below Review of Systems  Objective:  BP 138/70   Pulse 63   Temp (!) 97.5 F (36.4 C) (Oral)   Ht 5' 9.25 (1.759 m)   Wt 202 lb (91.6 kg)   SpO2 99%   BMI 29.62 kg/m   Wt Readings from Last 3 Encounters:  11/13/23  202 lb (91.6 kg)  09/07/23 200 lb 4 oz (90.8 kg)  08/11/23 204 lb 2 oz (92.6 kg)      Physical Exam Vitals and nursing note reviewed.  Constitutional:      Appearance: Normal appearance. He is not ill-appearing.  HENT:     Head: Normocephalic and atraumatic.     Mouth/Throat:     Mouth: Mucous membranes are moist.     Pharynx: Oropharynx is clear. No oropharyngeal exudate or posterior oropharyngeal erythema.  Eyes:     Extraocular Movements: Extraocular movements intact.     Conjunctiva/sclera: Conjunctivae normal.     Pupils: Pupils are equal, round, and reactive to light.  Neck:     Thyroid : No thyroid  mass or thyromegaly.     Comments: Neck circ 42cm Cardiovascular:     Rate and Rhythm: Normal rate and regular rhythm.     Pulses: Normal  pulses.     Heart sounds: Normal heart sounds. No murmur heard. Pulmonary:     Effort: Pulmonary effort is normal. No respiratory distress.     Breath sounds: Normal breath sounds. No wheezing, rhonchi or rales.  Abdominal:     General: Bowel sounds are normal. There is no distension or abdominal bruit.     Palpations: Abdomen is soft. There is no pulsatile mass.  Musculoskeletal:        General: Normal range of motion.     Cervical back: Normal range of motion and neck supple.  Lymphadenopathy:     Cervical: No cervical adenopathy.  Skin:    General: Skin is warm and dry.     Findings: Rash present.  Neurological:     Mental Status: He is alert.  Psychiatric:        Mood and Affect: Mood normal.        Behavior: Behavior normal.        Assessment & Plan:   Problem List Items Addressed This Visit   None Visit Diagnoses       Encounter for immunization    -  Primary   Relevant Orders   Flu vaccine HIGH DOSE PF(Fluzone Trivalent) (Completed)     Special screening for malignant neoplasms, colon       Relevant Orders   Cologuard        No orders of the defined types were placed in this encounter.   Orders Placed This Encounter  Procedures   Flu vaccine HIGH DOSE PF(Fluzone Trivalent)   Cologuard    Patient Instructions  I have ordered Cologuard.  Flu shot today  Stay off trazodone .  Try L-theanine for night time awakenings.  I'd like to order sleep study to evaluate for sleep apnea or decreased oxygenation at night time.  Pending results, we may try sleep medicine Silenor 3-6mg  at night.   Bedtime routine checklist: 1. Avoid naps during the day 2. Avoid stimulants such as caffeine  and nicotine. Avoid bedtime alcohol (it can speed onset of sleep but the body's metabolism can cause awakenings). 3. All forms of exercise help ensure sound sleep - limit vigorous exercise to morning or late afternoon 4. Avoid food too close to bedtime including chocolate (which  contains caffeine ) 5. Soak up natural light 6. Establish regular bedtime routine. 7. Associate bed with sleep - avoid TV, computer or phone, reading while in bed. 8. Ensure pleasant, relaxing sleep environment - quiet, dark, cool room.   Follow up plan: Return if symptoms worsen or fail to improve.  Anton Blas, MD

## 2023-11-13 NOTE — Patient Instructions (Addendum)
 I have ordered Cologuard.  Flu shot today  Stay off trazodone .  Try L-theanine for night time awakenings.  I'd like to order sleep study to evaluate for sleep apnea or decreased oxygenation at night time.  Pending results, we may try sleep medicine Silenor 3-6mg  at night.   Bedtime routine checklist: 1. Avoid naps during the day 2. Avoid stimulants such as caffeine  and nicotine. Avoid bedtime alcohol (it can speed onset of sleep but the body's metabolism can cause awakenings). 3. All forms of exercise help ensure sound sleep - limit vigorous exercise to morning or late afternoon 4. Avoid food too close to bedtime including chocolate (which contains caffeine ) 5. Soak up natural light 6. Establish regular bedtime routine. 7. Associate bed with sleep - avoid TV, computer or phone, reading while in bed. 8. Ensure pleasant, relaxing sleep environment - quiet, dark, cool room.

## 2023-11-16 ENCOUNTER — Encounter: Payer: Self-pay | Admitting: Family Medicine

## 2023-11-16 DIAGNOSIS — R400 Somnolence: Secondary | ICD-10-CM | POA: Insufficient documentation

## 2023-11-16 NOTE — Assessment & Plan Note (Addendum)
 Chronic issue, predominant maintenance issue, acutely worse with recent stress. Partial response to L-theanine.  Has stayed at relatively low dose melatonin due to increased grogginess. May try extended release melatonin which may be more elective for sleep maintenance.  Trazodone  caused grogginess the next morning as well as painful erection x1 - will remain off this.  See below re: OSA evaluation. If unrevealing, discussed trial silenor 3-6mg  for sleep maintenance insomnia.  Have previously reviewed sleep hygiene, checklist previously provided.

## 2023-11-16 NOTE — Assessment & Plan Note (Signed)
 Endorses daytime somnolence, morning headaches, nonrestorative sleep.  Low ESS = 2 However given above symptoms, discussed HST - will order. Neck circ = 42cm

## 2023-11-16 NOTE — Assessment & Plan Note (Signed)
 Reassuring CCS = zero - will remain off statin

## 2023-11-22 ENCOUNTER — Encounter: Payer: Self-pay | Admitting: Family Medicine

## 2023-11-28 DIAGNOSIS — Z1211 Encounter for screening for malignant neoplasm of colon: Secondary | ICD-10-CM | POA: Diagnosis not present

## 2023-12-03 LAB — COLOGUARD: COLOGUARD: NEGATIVE

## 2023-12-04 ENCOUNTER — Ambulatory Visit: Payer: Self-pay | Admitting: Family Medicine

## 2023-12-06 NOTE — Telephone Encounter (Signed)
 Copied from CRM [id number]. Topic: Clinical - Medical Advice >> Dec 06, 2023 10:20 AM Terri MATSU wrote: Reason for CRM: Patient didn't understand why he received a msg from Joellen on MyChart and Dr.Gutierrez told him something different. Callback number [phone]

## 2023-12-07 NOTE — Telephone Encounter (Signed)
 Copied from CRM #[id number]. Topic: General - Other >> Dec 07, 2023  8:52 AM Mercedes MATSU wrote: Reason for CRM: Patient called in requesting a call back from [facility] Joelene who he was communicating with via mychart. He said he had a couple of questions and would appreciate if she called him back today. He can be reached at [phone]. He said to please contact his cell, NOT HIS HOME NUMBER.

## 2023-12-11 ENCOUNTER — Ambulatory Visit: Admitting: Family Medicine

## 2023-12-11 ENCOUNTER — Encounter: Payer: Self-pay | Admitting: Family Medicine

## 2023-12-11 VITALS — BP 122/80 | HR 59 | Temp 97.8°F | Ht 69.25 in | Wt 205.0 lb

## 2023-12-11 DIAGNOSIS — R002 Palpitations: Secondary | ICD-10-CM | POA: Diagnosis not present

## 2023-12-11 DIAGNOSIS — R400 Somnolence: Secondary | ICD-10-CM

## 2023-12-11 DIAGNOSIS — F5104 Psychophysiologic insomnia: Secondary | ICD-10-CM | POA: Diagnosis not present

## 2023-12-11 DIAGNOSIS — Z6379 Other stressful life events affecting family and household: Secondary | ICD-10-CM | POA: Diagnosis not present

## 2023-12-11 MED ORDER — DOXEPIN HCL 3 MG PO TABS: 3.0000 mg | ORAL_TABLET | Freq: Every evening | ORAL | 3 refills | Status: AC | PRN

## 2023-12-11 NOTE — Assessment & Plan Note (Signed)
 This does contribute to worsening insomnia over the past 6 months.  Discussed with patient.  To consider counseling, specifically CBT-I which is first line treatment for insomnia.  Discussed may consider trial daily antidepressant as well. See above.

## 2023-12-11 NOTE — Assessment & Plan Note (Signed)
 Doubt OSA related - anticipate more due to poore sleep from sleep maintenance insomnia (averaging 4 hours/night)  Consider HST pending above treatment response.

## 2023-12-11 NOTE — Progress Notes (Signed)
 Ph: [phone] Fax: [phone]   Patient ID: Terrence Middleton, male    DOB: 1950/11/07, 73 y.o.   MRN: [id number]  This visit was conducted in person.  BP 122/80   Pulse (!) 59   Temp 97.8 F (36.6 C) (Oral)   Ht 5' 9.25 (1.759 m)   Wt 205 lb (93 kg)   SpO2 98%   BMI 30.06 kg/m    CC: 1 month follow up visit  Subjective:   HPI: Terrence Middleton is a 73 y.o. male presenting on 12/11/2023 for Insomnia (Discuss ongoing insomnia)   From last visit: Sleep maintenance insomnia - benadryl and melatonin not too helpful, L-theanine was somewhat helpful. This is in setting of increased stressors wih sleep difficult present  over the past 6 months.   Bedtime 10:30pm Sleep latency - 10 min Wakes up at 3-4 am, unable to fall back asleep.  Has tried melatonin 2.5-3 mg tablets nightly - some am grogginess.  Last month he tried trazodone  25mg  at night x3 nights - very cloudy the next morning. Had painful erection as well next am.    Chronic predominantly sleep maintenance insomnia with only partial response to L-theanine.  Since, he's tried extended release melatonin 4mg  - mixed results. Back on IR form 3mg  dose.  Avoiding trazodone  due to am grogginess and priapism.  Plan was to evaluate for sleep apnea with home sleep test then reassess insomnia, to consider trial of silenor 3-6 mg.   Already has good sleep hygiene/ bedtime routine.   Previously was getting 6-7 hours/night.  Cared for MIL until she passed away in the home 02/25/2023.  Wife with PD.   Feels loud heart beat in abdomen, present for the past 1.5 months. No fmhx or personal hx AAA.       Relevant past medical, surgical, family and social history reviewed and updated as indicated. Interim medical history since our last visit reviewed. Allergies and medications reviewed and updated. Outpatient Medications Prior to Visit  Medication Sig Dispense Refill   albuterol  (VENTOLIN  HFA) 108 (90 Base) MCG/ACT inhaler  INHALE 2 PUFFS INTO THE LUNGS EVERY 4 HOURS AS NEEDED FOR WHEEZING OR SHORTNESS OF BREATH. 18 each 12   ascorbic acid (VITAMIN C) 500 MG tablet Take 500 mg by mouth daily.     EPINEPHrine  0.3 mg/0.3 mL IJ SOAJ injection Inject into thigh for severe allergic reaction 1 each 12   famotidine (PEPCID) 20 MG tablet Take 20 mg by mouth daily.     Sodium Chloride -Sodium Bicarb 1.57 g PACK Place into the nose. Uses every morning     Tiotropium Bromide-Olodaterol (STIOLTO RESPIMAT ) 2.5-2.5 MCG/ACT AERS INHALE 2 PUFFS BY MOUTH INTO THE LUNGS DAILY 4 g 12   No facility-administered medications prior to visit.     Per HPI unless specifically indicated in ROS section below Review of Systems  Objective:  BP 122/80   Pulse (!) 59   Temp 97.8 F (36.6 C) (Oral)   Ht 5' 9.25 (1.759 m)   Wt 205 lb (93 kg)   SpO2 98%   BMI 30.06 kg/m   Wt Readings from Last 3 Encounters:  12/11/23 205 lb (93 kg)  11/13/23 202 lb (91.6 kg)  09/07/23 200 lb 4 oz (90.8 kg)      Physical Exam Vitals and nursing note reviewed.  Constitutional:      Appearance: Normal appearance. He is not ill-appearing.  HENT:     Head: Normocephalic and atraumatic.  Eyes:  Extraocular Movements: Extraocular movements intact.     Pupils: Pupils are equal, round, and reactive to light.  Neck:     Thyroid : No thyroid  mass or thyromegaly.  Cardiovascular:     Rate and Rhythm: Normal rate and regular rhythm.     Pulses: Normal pulses.     Heart sounds: Normal heart sounds. No murmur heard. Pulmonary:     Effort: Pulmonary effort is normal. No respiratory distress.     Breath sounds: Normal breath sounds. No wheezing, rhonchi or rales.  Abdominal:     General: Bowel sounds are normal. There is no distension.     Palpations: Abdomen is soft. There is no mass.     Tenderness: There is no abdominal tenderness. There is no guarding or rebound. Negative signs include Murphy's sign.     Hernia: A hernia is present. Hernia is  present in the umbilical area (small, nontender, reducible).     Comments: No abdominorenal bruits appreciated  Musculoskeletal:     Cervical back: Normal range of motion and neck supple.  Neurological:     Mental Status: He is alert.  Psychiatric:        Mood and Affect: Mood normal.        Behavior: Behavior normal.       Results for orders placed or performed in visit on 11/22/23  Fecal occult blood, imunochemical   Collection Time: 11/03/23 12:00 AM  Result Value Ref Range   IFOBT Negative     Assessment & Plan:   Problem List Items Addressed This Visit     Chronic insomnia - Primary   Chronic issue present for the past 1-2 yrs but acutely worse in setting of worsening stressors over the past 6 months. Predominant sleep maintenance issue.  Discussed melatonin use as well as other treatments tried to date including L-theanine.  Discussed further management strategies including low dose doxepin, CBT-I, stress management with mood medication - consider Lexapro.  Will start doxepin (Silenor) 3mg  nightly for sleep, update with effect.  Discussed HST to eval for OSA contribution - ok to defer at this time, reassess need for HST pending effect of above.  We have previously reviewed sleep hygiene measures/bedtime routine.       Daytime somnolence   Doubt OSA related - anticipate more due to poore sleep from sleep maintenance insomnia (averaging 4 hours/night)  Consider HST pending above treatment response.       Other stressful life events affecting family and household   This does contribute to worsening insomnia over the past 6 months.  Discussed with patient.  To consider counseling, specifically CBT-I which is first line treatment for insomnia.  Discussed may consider trial daily antidepressant as well. See above.       Awareness of heart beat   Reassuring exam. No indication of abdomino=renal bruit. Reassured this is not consistent with AAA. Also reviewed reassuring  cardiac CT with calcium score of zero (08/2023).  Anticipate stressors contribute to this sensation.        Meds ordered this encounter  Medications   Doxepin HCl 3 MG TABS    Sig: Take 1 tablet (3 mg total) by mouth at bedtime as needed (insomnia).    Dispense:  30 tablet    Refill:  3    No orders of the defined types were placed in this encounter.   Patient Instructions  Ok to increase regular melatonin to 5mg  at night.  Price out Silenor (doxepin) 3mg  sent to  pharmacy.  Consider counseling, consider daily mood medicine for stress if Silenor unavailable.  Good to see you today.  Ok to hold off on home sleep test until we try above things.   Follow up plan: Return if symptoms worsen or fail to improve.  Anton Blas, MD

## 2023-12-11 NOTE — Assessment & Plan Note (Addendum)
 Chronic issue present for the past 1-2 yrs but acutely worse in setting of worsening stressors over the past 6 months. Predominant sleep maintenance issue.  Discussed melatonin use as well as other treatments tried to date including L-theanine.  Discussed further management strategies including low dose doxepin, CBT-I, stress management with mood medication - consider Lexapro.  Will start doxepin (Silenor) 3mg  nightly for sleep, update with effect.  Discussed HST to eval for OSA contribution - ok to defer at this time, reassess need for HST pending effect of above.  We have previously reviewed sleep hygiene measures/bedtime routine.

## 2023-12-11 NOTE — Patient Instructions (Addendum)
 Ok to increase regular melatonin to 5mg  at night.  Price out Silenor (doxepin) 3mg  sent to pharmacy.  Consider counseling, consider daily mood medicine for stress if Silenor unavailable.  Good to see you today.  Ok to hold off on home sleep test until we try above things.

## 2023-12-11 NOTE — Assessment & Plan Note (Signed)
 Reassuring exam. No indication of abdomino=renal bruit. Reassured this is not consistent with AAA. Also reviewed reassuring cardiac CT with calcium score of zero (08/2023).  Anticipate stressors contribute to this sensation.

## 2023-12-19 ENCOUNTER — Telehealth: Payer: Self-pay

## 2023-12-26 DIAGNOSIS — H43813 Vitreous degeneration, bilateral: Secondary | ICD-10-CM | POA: Diagnosis not present

## 2024-08-06 ENCOUNTER — Other Ambulatory Visit

## 2024-08-06 ENCOUNTER — Ambulatory Visit

## 2024-08-12 ENCOUNTER — Encounter: Admitting: Family Medicine
